# Patient Record
Sex: Female | Born: 1950 | Race: Black or African American | Hispanic: No | Marital: Married | Smoking: Never smoker
Health system: Southern US, Community
[De-identification: ages and names within clinical notes are randomized; demographics above are authoritative.]

## PROBLEM LIST (undated history)

## (undated) DIAGNOSIS — E119 Type 2 diabetes mellitus without complications: Secondary | ICD-10-CM

## (undated) DIAGNOSIS — K219 Gastro-esophageal reflux disease without esophagitis: Secondary | ICD-10-CM

## (undated) DIAGNOSIS — Z9889 Other specified postprocedural states: Secondary | ICD-10-CM

## (undated) DIAGNOSIS — E7800 Pure hypercholesterolemia, unspecified: Secondary | ICD-10-CM

## (undated) DIAGNOSIS — E039 Hypothyroidism, unspecified: Secondary | ICD-10-CM

## (undated) DIAGNOSIS — R112 Nausea with vomiting, unspecified: Secondary | ICD-10-CM

## (undated) DIAGNOSIS — G4730 Sleep apnea, unspecified: Secondary | ICD-10-CM

## (undated) DIAGNOSIS — I10 Essential (primary) hypertension: Secondary | ICD-10-CM

## (undated) HISTORY — PX: BREAST BIOPSY: SHX20

## (undated) HISTORY — PX: TUBAL LIGATION: SHX77

## (undated) HISTORY — PX: ABDOMINAL HYSTERECTOMY: SHX81

---

## 2001-03-04 ENCOUNTER — Ambulatory Visit (HOSPITAL_COMMUNITY): Admission: RE | Admit: 2001-03-04 | Discharge: 2001-03-04 | Payer: Self-pay | Admitting: Internal Medicine

## 2001-03-04 ENCOUNTER — Encounter: Payer: Self-pay | Admitting: Internal Medicine

## 2002-03-06 ENCOUNTER — Encounter: Payer: Self-pay | Admitting: Internal Medicine

## 2002-03-06 ENCOUNTER — Ambulatory Visit (HOSPITAL_COMMUNITY): Admission: RE | Admit: 2002-03-06 | Discharge: 2002-03-06 | Payer: Self-pay | Admitting: Internal Medicine

## 2002-12-08 ENCOUNTER — Ambulatory Visit (HOSPITAL_COMMUNITY): Admission: RE | Admit: 2002-12-08 | Discharge: 2002-12-08 | Payer: Self-pay | Admitting: Internal Medicine

## 2003-10-03 ENCOUNTER — Ambulatory Visit (HOSPITAL_COMMUNITY): Admission: RE | Admit: 2003-10-03 | Discharge: 2003-10-03 | Payer: Self-pay | Admitting: Internal Medicine

## 2004-10-23 ENCOUNTER — Ambulatory Visit (HOSPITAL_COMMUNITY): Admission: RE | Admit: 2004-10-23 | Discharge: 2004-10-23 | Payer: Self-pay | Admitting: Internal Medicine

## 2005-10-26 ENCOUNTER — Ambulatory Visit (HOSPITAL_COMMUNITY): Admission: RE | Admit: 2005-10-26 | Discharge: 2005-10-26 | Payer: Self-pay | Admitting: *Deleted

## 2006-11-01 ENCOUNTER — Ambulatory Visit (HOSPITAL_COMMUNITY): Admission: RE | Admit: 2006-11-01 | Discharge: 2006-11-01 | Payer: Self-pay | Admitting: Internal Medicine

## 2007-11-03 ENCOUNTER — Ambulatory Visit (HOSPITAL_COMMUNITY): Admission: RE | Admit: 2007-11-03 | Discharge: 2007-11-03 | Payer: Self-pay | Admitting: Internal Medicine

## 2007-11-08 ENCOUNTER — Ambulatory Visit (HOSPITAL_COMMUNITY): Admission: RE | Admit: 2007-11-08 | Discharge: 2007-11-08 | Payer: Self-pay | Admitting: Internal Medicine

## 2007-11-08 ENCOUNTER — Encounter: Payer: Self-pay | Admitting: Orthopedic Surgery

## 2007-12-01 ENCOUNTER — Ambulatory Visit: Payer: Self-pay | Admitting: Orthopedic Surgery

## 2007-12-01 DIAGNOSIS — M25579 Pain in unspecified ankle and joints of unspecified foot: Secondary | ICD-10-CM | POA: Insufficient documentation

## 2007-12-12 ENCOUNTER — Encounter: Payer: Self-pay | Admitting: Orthopedic Surgery

## 2008-11-05 ENCOUNTER — Ambulatory Visit (HOSPITAL_COMMUNITY): Admission: RE | Admit: 2008-11-05 | Discharge: 2008-11-05 | Payer: Self-pay | Admitting: Internal Medicine

## 2009-06-04 ENCOUNTER — Ambulatory Visit: Payer: Self-pay | Admitting: Orthopedic Surgery

## 2009-06-04 DIAGNOSIS — M7660 Achilles tendinitis, unspecified leg: Secondary | ICD-10-CM | POA: Insufficient documentation

## 2009-07-22 ENCOUNTER — Ambulatory Visit: Payer: Self-pay | Admitting: Orthopedic Surgery

## 2009-11-07 ENCOUNTER — Ambulatory Visit (HOSPITAL_COMMUNITY): Admission: RE | Admit: 2009-11-07 | Discharge: 2009-11-07 | Payer: Self-pay | Admitting: Internal Medicine

## 2010-10-10 ENCOUNTER — Other Ambulatory Visit (HOSPITAL_COMMUNITY): Payer: Self-pay | Admitting: Internal Medicine

## 2010-10-10 DIAGNOSIS — Z139 Encounter for screening, unspecified: Secondary | ICD-10-CM

## 2010-11-17 ENCOUNTER — Ambulatory Visit (HOSPITAL_COMMUNITY)
Admission: RE | Admit: 2010-11-17 | Discharge: 2010-11-17 | Disposition: A | Discharge status: Home / Self Care | Payer: BC Managed Care – PPO | Source: Ambulatory Visit | Attending: Internal Medicine | Admitting: Internal Medicine

## 2010-11-17 DIAGNOSIS — Z1231 Encounter for screening mammogram for malignant neoplasm of breast: Secondary | ICD-10-CM | POA: Insufficient documentation

## 2010-11-17 DIAGNOSIS — Z139 Encounter for screening, unspecified: Secondary | ICD-10-CM

## 2011-01-02 NOTE — Op Note (Signed)
   [name], [name]                   ACCOUNT NO.:  [id number]   MEDICAL RECORD NO.:  [id number]                   PATIENT TYPE:  AMB   LOCATION:  DAY                                  FACILITY:  [facility]   PHYSICIAN:  R. Roetta Sessions, M.D.              DATE OF BIRTH:  05-04-1951   DATE OF PROCEDURE:  12/08/2002  DATE OF DISCHARGE:                                 OPERATIVE REPORT   PROCEDURE:  Screening colonoscopy.   INDICATIONS FOR PROCEDURE:  The patient is a 60 year old lady referred for  colorectal cancer screening.  She is followed primarily by Dr. Ouida Sills.  She  is devoid of any lower GI tract symptoms.  No prior history of any imaging  of her colon.  There is no family history.  Colonoscopy is now being done.  This approach has been discussed with the patient at the bedside.  The  potential risks, benefits, and alternatives have been reviewed and questions  answered.  She is agreeable.   PROCEDURE:  O2 saturation, blood pressure, pulses, and respirations were  monitored throughout the entire procedure.  Conscious sedation was with  Versed 5 mg IV, Demerol 50 mg IV in divided doses.  The instrument used was  the Olympus video chip adult colonoscope.   FINDINGS:  Digital rectal examination revealed no abnormalities.   ENDOSCOPIC FINDINGS:  The prep was good.   Rectum:  Examination of the rectal mucosa including retroflex view of the  anal verge revealed no abnormalities.   Colon:  The colonic mucosa was surveyed from the rectosigmoid junction  through the left, transverse, right colon to the area of the appendiceal  orifice, ileocecal valve, and cecum.  These structures were well-seen and  photographed for the record.  Aside from having a redundant and elongated  colon, the mucosa appeared normal to the cecum.  From the level of the cecum  and ileocecal valve, the scope was slowly withdrawn.  All previously  mentioned mucosal surfaces were again seen.  No other  abnormalities were  observed.  The patient tolerated the procedure well and was reactive in  endoscopy.    IMPRESSION:  Normal rectum.  Tortuous, elongated but otherwise normal colon.   RECOMMENDATIONS:  Repeat colonoscopy in 10 years.                                               Jonathon Bellows, M.D.    RMR/MEDQ  D:  12/08/2002  T:  12/08/2002  Job:  [id number]   cc:   Kingsley Callander. Ouida Sills, M.D.  [address]  [place]  [place] [address]  Fax: [phone]

## 2011-01-15 ENCOUNTER — Emergency Department (HOSPITAL_COMMUNITY)
Admission: EM | Admit: 2011-01-15 | Discharge: 2011-01-15 | Disposition: A | Discharge status: Home / Self Care | Payer: BC Managed Care – PPO | Attending: Emergency Medicine | Admitting: Emergency Medicine

## 2011-01-15 DIAGNOSIS — K219 Gastro-esophageal reflux disease without esophagitis: Secondary | ICD-10-CM | POA: Insufficient documentation

## 2011-01-15 DIAGNOSIS — S90569A Insect bite (nonvenomous), unspecified ankle, initial encounter: Secondary | ICD-10-CM | POA: Insufficient documentation

## 2011-01-15 DIAGNOSIS — E119 Type 2 diabetes mellitus without complications: Secondary | ICD-10-CM | POA: Insufficient documentation

## 2011-01-15 DIAGNOSIS — E7800 Pure hypercholesterolemia, unspecified: Secondary | ICD-10-CM | POA: Insufficient documentation

## 2011-11-13 ENCOUNTER — Other Ambulatory Visit (HOSPITAL_COMMUNITY): Payer: Self-pay | Admitting: Internal Medicine

## 2011-11-13 DIAGNOSIS — Z139 Encounter for screening, unspecified: Secondary | ICD-10-CM

## 2011-12-07 ENCOUNTER — Ambulatory Visit (HOSPITAL_COMMUNITY)
Admission: RE | Admit: 2011-12-07 | Discharge: 2011-12-07 | Disposition: A | Discharge status: Home / Self Care | Payer: BC Managed Care – PPO | Source: Ambulatory Visit | Attending: Internal Medicine | Admitting: Internal Medicine

## 2011-12-07 DIAGNOSIS — Z139 Encounter for screening, unspecified: Secondary | ICD-10-CM

## 2011-12-07 DIAGNOSIS — Z1231 Encounter for screening mammogram for malignant neoplasm of breast: Secondary | ICD-10-CM | POA: Insufficient documentation

## 2012-11-21 ENCOUNTER — Other Ambulatory Visit (HOSPITAL_COMMUNITY): Payer: Self-pay | Admitting: Internal Medicine

## 2012-11-21 DIAGNOSIS — Z139 Encounter for screening, unspecified: Secondary | ICD-10-CM

## 2012-11-30 ENCOUNTER — Telehealth: Payer: Self-pay

## 2012-11-30 ENCOUNTER — Other Ambulatory Visit: Payer: Self-pay

## 2012-11-30 DIAGNOSIS — Z1211 Encounter for screening for malignant neoplasm of colon: Secondary | ICD-10-CM

## 2012-11-30 NOTE — Telephone Encounter (Signed)
Gastroenterology Pre-Procedure Form     Request Date: 12/26/2012     Requesting Physician: Dr. Jena Gauss   ( Last one was done 12/08/2002 by [facility] and next was due in 10 years)     PATIENT INFORMATION:  Stephanie [name] is a 62 y.o., female (DOB=1951-01-12).  PROCEDURE: Procedure(s) requested: colonoscopy Procedure Reason: screening for colon cancer  PATIENT REVIEW QUESTIONS: The patient reports the following:   1. Diabetes Melitis: YES 2. Joint replacements in the past 12 months: no 3. Major health problems in the past 3 months: no 4. Has an artificial valve or MVP:no 5. Has been advised in past to take antibiotics in advance of a procedure like teeth cleaning: no}    MEDICATIONS & ALLERGIES:    Patient reports the following regarding taking any blood thinners:   Plavix? no Aspirin? YES Coumadin?  no  Patient confirms/reports the following medications:  Current Outpatient Prescriptions  Medication Sig Dispense Refill  . aspirin 81 MG tablet Take 81 mg by mouth daily.      . metFORMIN (GLUCOPHAGE) 500 MG tablet Take 500 mg by mouth 2 (two) times daily with a meal.      . pravastatin (PRAVACHOL) 40 MG tablet Take 40 mg by mouth daily.      . ranitidine (ZANTAC) 150 MG tablet Take 150 mg by mouth 2 (two) times daily. Only occasionally       No current facility-administered medications for this visit.    Patient confirms/reports the following allergies:  No Known Allergies  Patient is appropriate to schedule for requested procedure(s): yes  AUTHORIZATION INFORMATION Primary Insurance:   ID #:   Group #: Pre-Cert / Auth required:  Pre-Cert / Auth #:   Secondary Insurance:   ID #:  Group #:  Pre-Cert / Auth required:  Pre-Cert / Auth #:   No orders of the defined types were placed in this encounter.    SCHEDULE INFORMATION: Procedure has been scheduled as follows:  Date:   12/26/2012     Time: 9:15 AM  Location: [facility] Short Stay  This Gastroenterology  Pre-Precedure Form is being routed to the following provider(s) for review: R. Roetta Sessions, MD

## 2012-12-01 MED ORDER — PEG-KCL-NACL-NASULF-NA ASC-C 100 G PO SOLR: 1.0000 | ORAL | Status: DC

## 2012-12-01 NOTE — Telephone Encounter (Signed)
Rx sent to [facility] in [place] and instructions mailed to pt.

## 2012-12-01 NOTE — Telephone Encounter (Signed)
No Glucophage the night before procedure or the morning of procedure.  Otherwise, ok to proceed.

## 2012-12-12 ENCOUNTER — Ambulatory Visit (HOSPITAL_COMMUNITY)
Admission: RE | Admit: 2012-12-12 | Discharge: 2012-12-12 | Disposition: A | Discharge status: Home / Self Care | Payer: BC Managed Care – PPO | Source: Ambulatory Visit | Attending: Internal Medicine | Admitting: Internal Medicine

## 2012-12-12 DIAGNOSIS — Z139 Encounter for screening, unspecified: Secondary | ICD-10-CM

## 2012-12-12 DIAGNOSIS — Z1231 Encounter for screening mammogram for malignant neoplasm of breast: Secondary | ICD-10-CM | POA: Insufficient documentation

## 2012-12-20 ENCOUNTER — Encounter (HOSPITAL_COMMUNITY): Payer: Self-pay | Admitting: Pharmacy Technician

## 2012-12-26 ENCOUNTER — Ambulatory Visit (HOSPITAL_COMMUNITY)
Admission: RE | Admit: 2012-12-26 | Discharge: 2012-12-26 | Disposition: A | Discharge status: Home / Self Care | Payer: BC Managed Care – PPO | Source: Ambulatory Visit | Attending: Internal Medicine | Admitting: Internal Medicine

## 2012-12-26 ENCOUNTER — Encounter (HOSPITAL_COMMUNITY): Payer: Self-pay | Admitting: *Deleted

## 2012-12-26 ENCOUNTER — Encounter (HOSPITAL_COMMUNITY)
Admission: RE | Disposition: A | Discharge status: Home / Self Care | Payer: Self-pay | Source: Ambulatory Visit | Attending: Internal Medicine

## 2012-12-26 DIAGNOSIS — Z7982 Long term (current) use of aspirin: Secondary | ICD-10-CM | POA: Insufficient documentation

## 2012-12-26 DIAGNOSIS — Z1211 Encounter for screening for malignant neoplasm of colon: Secondary | ICD-10-CM

## 2012-12-26 DIAGNOSIS — Q438 Other specified congenital malformations of intestine: Secondary | ICD-10-CM | POA: Insufficient documentation

## 2012-12-26 DIAGNOSIS — E7800 Pure hypercholesterolemia, unspecified: Secondary | ICD-10-CM | POA: Insufficient documentation

## 2012-12-26 DIAGNOSIS — K219 Gastro-esophageal reflux disease without esophagitis: Secondary | ICD-10-CM | POA: Insufficient documentation

## 2012-12-26 DIAGNOSIS — E119 Type 2 diabetes mellitus without complications: Secondary | ICD-10-CM | POA: Insufficient documentation

## 2012-12-26 DIAGNOSIS — Z79899 Other long term (current) drug therapy: Secondary | ICD-10-CM | POA: Insufficient documentation

## 2012-12-26 HISTORY — DX: Gastro-esophageal reflux disease without esophagitis: K21.9

## 2012-12-26 HISTORY — DX: Type 2 diabetes mellitus without complications: E11.9

## 2012-12-26 HISTORY — DX: Pure hypercholesterolemia, unspecified: E78.00

## 2012-12-26 HISTORY — PX: COLONOSCOPY: SHX5424

## 2012-12-26 LAB — GLUCOSE, CAPILLARY

## 2012-12-26 SURGERY — COLONOSCOPY
Anesthesia: Moderate Sedation

## 2012-12-26 MED ORDER — MEPERIDINE HCL 100 MG/ML IJ SOLN
INTRAMUSCULAR | Status: AC
  Filled 2012-12-26: qty 1

## 2012-12-26 MED ORDER — ONDANSETRON HCL 4 MG/2ML IJ SOLN
INTRAMUSCULAR | Status: DC | PRN
  Administered 2012-12-26: 4 mg via INTRAVENOUS

## 2012-12-26 MED ORDER — SODIUM CHLORIDE 0.9 % IV SOLN
INTRAVENOUS | Status: DC
  Administered 2012-12-26: 09:00:00 via INTRAVENOUS

## 2012-12-26 MED ORDER — MIDAZOLAM HCL 5 MG/5ML IJ SOLN
INTRAMUSCULAR | Status: DC | PRN
  Administered 2012-12-26: 1 mg via INTRAVENOUS
  Administered 2012-12-26: 2 mg via INTRAVENOUS
  Administered 2012-12-26 (×2): 1 mg via INTRAVENOUS

## 2012-12-26 MED ORDER — MEPERIDINE HCL 100 MG/ML IJ SOLN
INTRAMUSCULAR | Status: DC | PRN
  Administered 2012-12-26: 25 mg via INTRAVENOUS
  Administered 2012-12-26: 50 mg via INTRAVENOUS

## 2012-12-26 MED ORDER — ONDANSETRON HCL 4 MG/2ML IJ SOLN
INTRAMUSCULAR | Status: AC
  Filled 2012-12-26: qty 2

## 2012-12-26 MED ORDER — MIDAZOLAM HCL 5 MG/5ML IJ SOLN
INTRAMUSCULAR | Status: AC
  Filled 2012-12-26: qty 10

## 2012-12-26 MED ORDER — SIMETHICONE 40 MG/0.6ML PO SUSP
ORAL | Status: DC | PRN
  Administered 2012-12-26: 09:00:00

## 2012-12-26 NOTE — H&P (Signed)
  Primary Care Physician:  Carylon Perches, MD Primary Gastroenterologist:  Dr. Jena Gauss  Pre-Procedure History & Physical: HPI:  Stephanie [name] is a 62 y.o. female is here for a screening colonoscopy.  Negative examination 10 years ago. No bowel symptoms. No family history colon polyps or colon cancer  Past Medical History  Diagnosis Date  . GERD (gastroesophageal reflux disease)   . Hypercholesteremia   . Diabetes mellitus without complication     Past Surgical History  Procedure Laterality Date  . Abdominal hysterectomy    . Tubal ligation      Prior to Admission medications   Medication Sig Start Date End Date Taking? Authorizing Provider  aspirin 81 MG tablet Take 81 mg by mouth daily.   Yes Historical Provider, MD  metFORMIN (GLUCOPHAGE) 500 MG tablet Take 500 mg by mouth 2 (two) times daily with a meal.   Yes Historical Provider, MD  peg 3350 powder (MOVIPREP) 100 G SOLR Take 1 kit (100 g total) by mouth as directed. 12/01/12  Yes Corbin Ade, MD  pravastatin (PRAVACHOL) 40 MG tablet Take 40 mg by mouth daily.   Yes Historical Provider, MD  ranitidine (ZANTAC) 150 MG tablet Take 150 mg by mouth daily as needed for heartburn. Only occasionally   Yes Historical Provider, MD    Allergies as of 11/30/2012  . (No Known Allergies)    Family History  Problem Relation Age of Onset  . Colon cancer Neg Hx     History   Social History  . Marital Status: Married    Spouse Name: N/A    Number of Children: N/A  . Years of Education: N/A   Occupational History  . Not on file.   Social History Main Topics  . Smoking status: Never Smoker   . Smokeless tobacco: Not on file  . Alcohol Use: No  . Drug Use: No  . Sexually Active: Not on file   Other Topics Concern  . Not on file   Social History Narrative  . No narrative on file    Review of Systems: See HPI, otherwise negative ROS  Physical Exam: BP 144/80  Pulse 78  Temp(Src) 97.9 F (36.6 C) (Oral)  Resp 19   Ht 5\' 3"  (1.6 m)  Wt 189 lb (85.73 kg)  BMI 33.49 kg/m2  SpO2 96% General:   Alert,  Well-developed, well-nourished, pleasant and cooperative in NAD Head:  Normocephalic and atraumatic. Eyes:  Sclera clear, no icterus.   Conjunctiva pink. Ears:  Normal auditory acuity. Nose:  No deformity, discharge,  or lesions. Mouth:  No deformity or lesions, dentition normal. Neck:  Supple; no masses or thyromegaly. Lungs:  Clear throughout to auscultation.   No wheezes, crackles, or rhonchi. No acute distress. Heart:  Regular rate and rhythm; no murmurs, clicks, rubs,  or gallops. Abdomen:   obese. Positive bowel sales. Soft and nontender without mass or organomegaly.  Msk:  Symmetrical without gross deformities. Normal posture. Pulses:  Normal pulses noted. Extremities:  Without clubbing or edema. Neurologic:  Alert and  oriented x4;  grossly normal neurologically. Skin:  Intact without significant lesions or rashes. Cervical Nodes:  No significant cervical adenopathy. Psych:  Alert and cooperative. Normal mood and affect.  Impression/Plan: Stephanie [name] is now here to undergo a screening colonoscopy.  Average risk screening examination.  Risks, benefits, limitations, imponderables and alternatives regarding colonoscopy have been reviewed with the patient. Questions have been answered. All parties agreeable.

## 2012-12-26 NOTE — Op Note (Signed)
[facility] [address] [facility] [place], [address]   COLONOSCOPY PROCEDURE REPORT  PATIENT: Stephanie [name], Stephanie [name]  MR#:         [id number] BIRTHDATE: 11/19/1950 , 61  yrs. old GENDER: Female ENDOSCOPIST: R.  Roetta Sessions, MD FACP FACG REFERRED BY:  Carylon Perches, M.D. PROCEDURE DATE:  12/26/2012 PROCEDURE:     Screening colonoscopy  INDICATIONS: average risk colorectal cancer screening examination  INFORMED CONSENT:  The risks, benefits, alternatives and imponderables including but not limited to bleeding, perforation as well as the possibility of a missed lesion have been reviewed.  The potential for biopsy, lesion removal, etc. have also been discussed.  Questions have been answered.  All parties agreeable. Please see the history and physical in the medical record for more information.  MEDICATIONS: Versed 5 mg IV and Demerol 75 mg IV in divided doses. Zofran 4 mg IV  DESCRIPTION OF PROCEDURE:  After a digital rectal exam was performed, the EC-3890Li ([id number])  colonoscope was advanced from the anus through the rectum and colon to the area of the cecum, ileocecal valve and appendiceal orifice.  The cecum was deeply intubated.  These structures were well-seen and photographed for the record.  From the level of the cecum and ileocecal valve, the scope was slowly and cautiously withdrawn.  The mucosal surfaces were carefully surveyed utilizing scope tip deflection to facilitate fold flattening as needed.  The scope was pulled down into the rectum where a thorough examination including retroflexion was performed.    FINDINGS:  Adequate preparation. Normal-appearing colonic and rectal mucosa. The colon was somewhat tortuous.  THERAPEUTIC / DIAGNOSTIC MANEUVERS PERFORMED:  COMPLICATIONS: none  CECAL WITHDRAWAL TIME:  8 minutes  IMPRESSION:  Normal rectum and colon  RECOMMENDATIONS: Repeat screening examination in 10  years   _______________________________ eSigned:  R. Roetta Sessions, MD FACP [facility] 12/26/2012 10:15 AM   CC:

## 2012-12-28 ENCOUNTER — Encounter (HOSPITAL_COMMUNITY): Payer: Self-pay | Admitting: Internal Medicine

## 2013-01-26 ENCOUNTER — Ambulatory Visit (INDEPENDENT_AMBULATORY_CARE_PROVIDER_SITE_OTHER): Payer: BC Managed Care – PPO

## 2013-01-26 ENCOUNTER — Ambulatory Visit (INDEPENDENT_AMBULATORY_CARE_PROVIDER_SITE_OTHER): Payer: BC Managed Care – PPO | Admitting: Orthopedic Surgery

## 2013-01-26 VITALS — BP 146/92 | Ht 62.0 in | Wt 191.0 lb

## 2013-01-26 DIAGNOSIS — M1712 Unilateral primary osteoarthritis, left knee: Secondary | ICD-10-CM

## 2013-01-26 DIAGNOSIS — M25562 Pain in left knee: Secondary | ICD-10-CM

## 2013-01-26 DIAGNOSIS — M1710 Unilateral primary osteoarthritis, unspecified knee: Secondary | ICD-10-CM

## 2013-01-26 DIAGNOSIS — M25569 Pain in unspecified knee: Secondary | ICD-10-CM

## 2013-01-26 DIAGNOSIS — IMO0002 Reserved for concepts with insufficient information to code with codable children: Secondary | ICD-10-CM

## 2013-01-26 MED ORDER — ETODOLAC ER 400 MG PO TB24: 400.0000 mg | ORAL_TABLET | Freq: Two times a day (BID) | ORAL | Status: DC

## 2013-01-26 NOTE — Patient Instructions (Addendum)
You have received a steroid shot. 15% of patients experience increased pain at the injection site with in the next 24 hours. This is best treated with ice and tylenol extra strength 2 tabs every 8 hours. If you are still having pain please call the office.    

## 2013-01-30 ENCOUNTER — Encounter: Payer: Self-pay | Admitting: Orthopedic Surgery

## 2013-01-30 NOTE — Progress Notes (Signed)
Patient ID: Stephanie [name], female   DOB: Mar 17, 1951, 62 y.o.   MRN: [id number] Chief Complaint  Patient presents with  . Knee Pain    Left knee pain and stiffness. Referred by Dr. Ouida Sills  consult request Dr. Ouida Sills  History: A 62 year old female presents with gradual onset of left knee pain radiating down to her left ankle. She was treated with etodolac twice a day did not improve. 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 She gets partial relief with medication or heating pad is worse when she stands more than he is in constant flexion. She does report some tingling in the leg as well as catching in the knee and swelling. Review of systems history of weight gain and fatigue blurred vision and watering of the eyes palpitations and shortness of breath with occasional snoring. She complains of heartburn and constipation joint pain and swelling stiffness rash and itching, numbness and tingling with dizziness, nervousness. Otherwise all other systems were reviewed and were negative.  History of diabetes acid reflux high cholesterol and arthritis  Hysterectomy, tubal ligation. Current medications are metformin, pravastatin, Zantac, aspirin.  Family history heart disease arthritis cancer and diabetes as well as ALS.  Subcutaneous history she's married she works in the [employer]. She does not smoke or drink and she completed her education through the 12th grade.  General appearance is normal, the patient is alert and oriented x3 with normal mood and affect. 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 Inspection and palpation left knee, revealed crepitance and tenderness on the medial joint line and in the patellofemoral joint range of motion is not restricted stability tests are normal muscle tone excellent skin intact pulse normal temperature normal sensation normal  No  abnormalities palpated or detected in terms of abnormal stability weakness skin rash in the Right knee X-rays show probable arthritic change without severe bone to bone disease.  Recommend injection for osteoarthritis  Patient agrees  Knee  Injection Procedure Note  Pre-operative Diagnosis: left knee oa  Post-operative Diagnosis: same  Indications: pain  Anesthesia: ethyl chloride   Procedure Details   Verbal consent was obtained for the procedure. Time out was completed.The joint was prepped with alcohol, followed by  Ethyl chloride spray and A 20 gauge needle was inserted into the knee via lateral approach; 4ml 1% lidocaine and 1 ml of depomedrol  was then injected into the joint . The needle was removed and the area cleansed and dressed.  Complications:  None; patient tolerated the procedure well.

## 2013-02-23 ENCOUNTER — Ambulatory Visit (INDEPENDENT_AMBULATORY_CARE_PROVIDER_SITE_OTHER): Payer: BC Managed Care – PPO | Admitting: Orthopedic Surgery

## 2013-02-23 VITALS — BP 123/70 | Ht 62.0 in | Wt 191.0 lb

## 2013-02-23 DIAGNOSIS — M179 Osteoarthritis of knee, unspecified: Secondary | ICD-10-CM

## 2013-02-23 DIAGNOSIS — M1710 Unilateral primary osteoarthritis, unspecified knee: Secondary | ICD-10-CM

## 2013-02-23 DIAGNOSIS — IMO0002 Reserved for concepts with insufficient information to code with codable children: Secondary | ICD-10-CM

## 2013-02-23 NOTE — Progress Notes (Signed)
Patient ID: Stephanie [name], female   DOB: October 26, 1950, 62 y.o.   MRN: [id number] Recheck left knee  Osteoarthritis left knee. Injected. Brace.   Full range of motion no tenderness. Improved. Followup as needed continue brace

## 2013-11-06 ENCOUNTER — Other Ambulatory Visit (HOSPITAL_COMMUNITY): Payer: Self-pay | Admitting: Internal Medicine

## 2013-11-06 DIAGNOSIS — Z1231 Encounter for screening mammogram for malignant neoplasm of breast: Secondary | ICD-10-CM

## 2013-12-18 ENCOUNTER — Ambulatory Visit (HOSPITAL_COMMUNITY)
Admission: RE | Admit: 2013-12-18 | Discharge: 2013-12-18 | Disposition: A | Discharge status: Home / Self Care | Payer: BC Managed Care – PPO | Source: Ambulatory Visit | Attending: Internal Medicine | Admitting: Internal Medicine

## 2013-12-18 DIAGNOSIS — Z1231 Encounter for screening mammogram for malignant neoplasm of breast: Secondary | ICD-10-CM

## 2014-01-23 ENCOUNTER — Ambulatory Visit (INDEPENDENT_AMBULATORY_CARE_PROVIDER_SITE_OTHER): Payer: BC Managed Care – PPO

## 2014-01-23 ENCOUNTER — Encounter: Payer: Self-pay | Admitting: Orthopedic Surgery

## 2014-01-23 ENCOUNTER — Ambulatory Visit (INDEPENDENT_AMBULATORY_CARE_PROVIDER_SITE_OTHER): Payer: BC Managed Care – PPO | Admitting: Orthopedic Surgery

## 2014-01-23 VITALS — BP 155/89 | Ht 62.0 in | Wt 190.0 lb

## 2014-01-23 DIAGNOSIS — M25569 Pain in unspecified knee: Secondary | ICD-10-CM

## 2014-01-23 DIAGNOSIS — M1710 Unilateral primary osteoarthritis, unspecified knee: Secondary | ICD-10-CM

## 2014-01-23 DIAGNOSIS — M179 Osteoarthritis of knee, unspecified: Secondary | ICD-10-CM

## 2014-01-23 DIAGNOSIS — IMO0002 Reserved for concepts with insufficient information to code with codable children: Secondary | ICD-10-CM

## 2014-01-23 MED ORDER — TRAMADOL HCL 50 MG PO TABS: 50.0000 mg | ORAL_TABLET | Freq: Four times a day (QID) | ORAL | Status: DC | PRN

## 2014-01-23 MED ORDER — DICLOFENAC POTASSIUM 50 MG PO TABS: 50.0000 mg | ORAL_TABLET | Freq: Two times a day (BID) | ORAL | Status: DC

## 2014-01-23 NOTE — Progress Notes (Signed)
Patient ID: Stephanie [name], female   DOB: Sep 19, 1950, 63 y.o.   MRN: [id number]  Chief Complaint  Patient presents with  . Knee Pain    History this is a 63 year old female presents to [place] for the first time with a history of bilateral knee pain left greater than right for the last 3-4 months unrelieved by Tylenol slightly improved icy hot. She says she has no functional deficits but when questioned further she does have difficulty climbing the stairs and she has weakness in the left knee when getting out of a chair. She also reports stiffness aching pain catching and 5/10 intensity  System review is negative except for some denture issues on heartburn joint pain visual disturbance memory loss mild numbness and tingling related to her diabetes mild diabetic symptoms of polydipsia and polyphagia  Otherwise normal  Medical history diabetes and reflux  Hypertension  Hysterectomy tubal ligation under surgical treatments  Current medications Zantac aspirin losartan metformin and progress that  No medical allergies  Family history of ALS and diabetes cancer arthritis thyroid disease heart attacks trochanter hypertension  No social habits  Vital signs: BP 155/89  Ht 5\' 2"  (1.575 m)  Wt 190 lb (86.183 kg)  BMI 34.74 kg/m2  General the patient is well-developed and well-nourished grooming and hygiene are normal  Upper extremity exam  The right and left upper extremity:   Inspection and palpation revealed no abnormalities in the upper extremities.   Range of motion is full without contracture.  Motor exam is normal with grade 5 strength.  The joints are fully reduced without subluxation.  There is no atrophy or tremor and muscle tone is normal.  All joints are stable.   Oriented x3 Mood and affect normal Ambulation remains normal Inspection of the lower extremities She only has about 90 of flexion in both knees with trace joint effusions primarily medial joint line  tenderness. Fortunately her ligaments are stable she has good muscle tone and strength in the quadriceps muscles her skin is clean dry and intact in each leg and both legs exhibit normal pulses and sensation   Bilateral knee x-rays ordered and interpreted show severe arthritis of the right less severe on the left see reports  Encounter Diagnoses  Name Primary?  . Knee pain   . Arthritis of knee, degenerative Yes    Meds ordered this encounter  Medications  . diclofenac (CATAFLAM) 50 MG tablet    Sig: Take 1 tablet (50 mg total) by mouth 2 (two) times daily.    Dispense:  90 tablet    Refill:  3  . traMADol (ULTRAM) 50 MG tablet    Sig: Take 1 tablet (50 mg total) by mouth every 6 (six) hours as needed.    Dispense:  60 tablet    Refill:  5   Home exercise knee program as well. 3 month followup

## 2014-01-23 NOTE — Patient Instructions (Signed)
Knee Exercises EXERCISES RANGE OF MOTION(ROM) AND STRETCHING EXERCISES These exercises may help you when beginning to rehabilitate your injury. Your symptoms may resolve with or without further involvement from your physician, physical therapist or athletic trainer. While completing these exercises, remember:    Restoring tissue flexibility helps normal motion to return to the joints. This allows healthier, less painful movement and activity.   An effective stretch should be held for at least 30 seconds.   A stretch should never be painful. You should only feel a gentle lengthening or release in the stretched tissue.  STRETCH - Knee Extension, Prone  Lie on your stomach on a firm surface, such as a bed or countertop. Place your right / left knee and leg just beyond the edge of the surface. You may wish to place a towel under the far end of your right / left thigh for comfort.   Relax your leg muscles and allow gravity to straighten your knee. Your clinician may advise you to add an ankle weight if more resistance is helpful for you.  You should feel a stretch in the back of your right / left knee.  RANGE OF MOTION - Knee Flexion, Active  Lie on your back with both knees straight. (If this causes back discomfort, bend your opposite knee, placing your foot flat on the floor.)   Slowly slide your heel back toward your buttocks until you feel a gentle stretch in the front of your knee or thigh.     STRETCH - Quadriceps, Prone   Lie on your stomach on a firm surface, such as a bed or padded floor.   Bend your right / left knee and grasp your ankle. If you are unable to reach, your ankle or pant leg, use a belt around your foot to lengthen your reach.   Gently pull your heel toward your buttocks. Your knee should not slide out to the side. You should feel a stretch in the front of your thigh and/or knee.   STRETCH  Hamstrings, Supine   Lie on your back. Loop a belt or towel over the ball of  your right / left foot.   Straighten your right / left knee and slowly pull on the belt to raise your leg. Do not allow the right / left knee to bend. Keep your opposite leg flat on the floor.  Raise the leg until you feel a gentle stretch behind your right / left knee or thigh.  STRENGTHENING EXERCISES These exercises may help you when beginning to rehabilitate your injury. They may resolve your symptoms with or without further involvement from your physician, physical therapist or athletic trainer. While completing these exercises, remember:    Muscles can gain both the endurance and the strength needed for everyday activities through controlled exercises.   Complete these exercises as instructed by your physician, physical therapist or athletic trainer. Progress the resistance and repetitions only as guided.   You may experience muscle soreness or fatigue, but the pain or discomfort you are trying to eliminate should never worsen during these exercises. If this pain does worsen, stop and make certain you are following the directions exactly. If the pain is still present after adjustments, discontinue the exercise until you can discuss the trouble with your clinician.  STRENGTH - Quadriceps, Isometrics  Lie on your back with your right / left leg extended and your opposite knee bent.   Gradually tense the muscles in the front of your right / left   thigh. You should see either your knee cap slide up toward your hip or increased dimpling just above the knee. This motion will push the back of the knee down toward the floor/mat/bed on which you are lying.   STRENGTH - Quadriceps, Short Arcs   Lie on your back. Place a rolled  towel roll under your knee so that the knee slightly bends.   Raise only your lower leg by tightening the muscles in the front of your thigh. Do not allow your thigh to rise.     STRENGTH - Quadriceps, Straight Leg Raises  Quality counts! Watch for signs that the  quadriceps muscle is working to insure you are strengthening the correct muscles and not "cheating" by substituting with healthier muscles.  Lay on your back with your right / left leg extended and your opposite knee bent.   Tense the muscles in the front of your right / left thigh. You should see either your knee cap slide up or increased dimpling just above the knee. Your thigh may even quiver.  Tighten these muscles even more and raise your leg 4 to 6 inches off the floor.   STRENGTH - Hamstring, Curls  Lay on your stomach with your legs extended. (If you lay on a bed, your feet may hang over the edge.)   Tighten the muscles in the back of your thigh to bend your right / left knee up to 90 degrees. Keep your hips flat on the bed/floor.    STRENGTH  Quadriceps, Squats  Stand in a door frame so that your feet and knees are in line with the frame.   Use your hands for balance, not support, on the frame.   Slowly lower your weight, bending at the hips and knees. Keep your lower legs upright so that they are parallel with the door frame. Squat only within the range that does not increase your knee pain. Never let your hips drop below your knees.   Slowly return upright, pushing with your legs, not pulling with your hands.   STRENGTH - Quadriceps, Wall Slides  Follow guidelines for form closely. Increased knee pain often results from poorly placed feet or knees.  Lean against a smooth wall or door and walk your feet out 18-24 inches. Place your feet hip-width apart.   Slowly slide down the wall or door until your knees bend _30________ degrees.* Keep your knees over your heels, not your toes, and in line with your hips, not falling to either side.   * Your physician, physical therapist or athletic trainer will alter this angle based on your symptoms and progress. Document Released: 06/17/2005 Document Revised: 10/26/2011 Document Reviewed: 11/15/2008 [employer] Patient Information 2013  [employer]    

## 2014-04-26 ENCOUNTER — Ambulatory Visit (INDEPENDENT_AMBULATORY_CARE_PROVIDER_SITE_OTHER): Payer: BC Managed Care – PPO | Admitting: Orthopedic Surgery

## 2014-04-26 VITALS — Ht 62.0 in | Wt 190.0 lb

## 2014-04-26 DIAGNOSIS — M1710 Unilateral primary osteoarthritis, unspecified knee: Secondary | ICD-10-CM

## 2014-04-26 DIAGNOSIS — M170 Bilateral primary osteoarthritis of knee: Secondary | ICD-10-CM

## 2014-04-26 NOTE — Patient Instructions (Signed)
Continue medication.

## 2014-04-27 NOTE — Progress Notes (Signed)
Chief Complaint  Patient presents with  . Follow-up    3 month recheck bilateral knees, s/p meds and home exercises    The patient reports that the diclofenac 50 mg twice a day is doing very well for her knee pain and has improved her shortness. She continues to have no catching locking or giving way review of systems musculoskeletal related to the knees is otherwise normal  Exam shows a well-developed well-nourished female grooming and hygiene are intact vital signs are Ht  (1.575 m)  Wt 190 lb (86.183 kg)  BMI 34.74 kg/m2  Bilateral knee examination  There is no tenderness or swelling in either knee. Knee flexion on the right 115 on the left is 120. Both knees are stable. Neurovascular examination is intact  Impression stable bilateral knees continue current arthritis medication.  Followup 3 months

## 2014-07-26 ENCOUNTER — Ambulatory Visit (INDEPENDENT_AMBULATORY_CARE_PROVIDER_SITE_OTHER): Payer: BC Managed Care – PPO | Admitting: Orthopedic Surgery

## 2014-07-26 ENCOUNTER — Encounter: Payer: Self-pay | Admitting: Orthopedic Surgery

## 2014-07-26 VITALS — HR 20 | Ht 62.0 in | Wt 190.0 lb

## 2014-07-26 DIAGNOSIS — M25561 Pain in right knee: Secondary | ICD-10-CM

## 2014-07-26 DIAGNOSIS — M170 Bilateral primary osteoarthritis of knee: Secondary | ICD-10-CM

## 2014-07-26 DIAGNOSIS — M25562 Pain in left knee: Secondary | ICD-10-CM

## 2014-07-26 MED ORDER — DICLOFENAC POTASSIUM 50 MG PO TABS: 50.0000 mg | ORAL_TABLET | Freq: Two times a day (BID) | ORAL | Status: DC

## 2014-07-26 NOTE — Progress Notes (Signed)
Patient ID: Stephanie [name], female   DOB: [birth year], 63 y.o.   MRN: [id number]  Chief Complaint  Patient presents with  . Follow-up    3 month recheck bilateral knees s/p home pt    HPI Stephanie [name] is a 63 y.o. female.  The patient notes her knees have then spray stable although she has noticed increased pain when she ran out of her diclofenac. It was holding her very well.  Review of Systems Review of Systems 1. Denies catching locking giving way of the knees has some difficulties getting up out of a chair  No Known Allergies  Current Outpatient Prescriptions  Medication Sig Dispense Refill  . aspirin 81 MG tablet Take 81 mg by mouth daily.    . diclofenac (CATAFLAM) 50 MG tablet Take 1 tablet (50 mg total) by mouth 2 (two) times daily. 90 tablet 3  . losartan (COZAAR) 100 MG tablet Take 100 mg by mouth daily.    . metFORMIN (GLUCOPHAGE) 500 MG tablet Take 500 mg by mouth 2 (two) times daily with a meal.    . pravastatin (PRAVACHOL) 40 MG tablet Take 40 mg by mouth daily.    . ranitidine (ZANTAC) 150 MG tablet Take 150 mg by mouth daily as needed for heartburn. Only occasionally    . traMADol (ULTRAM) 50 MG tablet Take 1 tablet (50 mg total) by mouth every 6 (six) hours as needed. 60 tablet 5  . etodolac (LODINE XL) 400 MG 24 hr tablet Take 1 tablet (400 mg total) by mouth 2 (two) times daily. (Patient not taking: Reported on 07/26/2014) 56 tablet 3  . peg 3350 powder (MOVIPREP) 100 G SOLR Take 1 kit (100 g total) by mouth as directed. (Patient not taking: Reported on 07/26/2014) 1 kit 0   No current facility-administered medications for this visit.      Physical Exam Physical Exam Pulse 20, height '5\' 2"'  (1.575 m), weight 190 lb (86.183 kg).  Gen. appearance normal The patient is alert and oriented person place and time  Exam of the knees  Inspection no warmth or swelling in either knee ROM painless passive range of motion without tenderness in either  knee Symmetric quadricep strength without atrophy in each knee and leg   Data Reviewed   Assessment    Stable osteoarthritis of both knees    Plan    Resume diclofenac return 3 months  Meds ordered this encounter  Medications  . losartan (COZAAR) 100 MG tablet    Sig: Take 100 mg by mouth daily.  . diclofenac (CATAFLAM) 50 MG tablet    Sig: Take 1 tablet (50 mg total) by mouth 2 (two) times daily.    Dispense:  90 tablet    Refill:  3          Arther Abbott 07/26/2014, 4:13 PM

## 2014-10-25 ENCOUNTER — Ambulatory Visit (INDEPENDENT_AMBULATORY_CARE_PROVIDER_SITE_OTHER): Payer: BLUE CROSS/BLUE SHIELD | Admitting: Orthopedic Surgery

## 2014-10-25 ENCOUNTER — Encounter: Payer: Self-pay | Admitting: Orthopedic Surgery

## 2014-10-25 VITALS — BP 114/78 | Ht 62.0 in | Wt 190.0 lb

## 2014-10-25 DIAGNOSIS — M170 Bilateral primary osteoarthritis of knee: Secondary | ICD-10-CM

## 2014-10-25 NOTE — Addendum Note (Signed)
Addended by: Adella HareBOOTHE, Erdem Naas B on: 10/25/2014 04:37 PM   Modules accepted: Level of Service

## 2014-10-25 NOTE — Patient Instructions (Signed)
Continue current medications. 

## 2014-10-25 NOTE — Progress Notes (Signed)
Patient ID: Stephanie [name], female   DOB: [birth year], 64 y.o.   MRN: [id number] Chief Complaint  Patient presents with  . 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

## 2014-11-26 ENCOUNTER — Other Ambulatory Visit (HOSPITAL_COMMUNITY): Payer: Self-pay | Admitting: Internal Medicine

## 2014-11-26 DIAGNOSIS — Z1231 Encounter for screening mammogram for malignant neoplasm of breast: Secondary | ICD-10-CM

## 2014-12-21 ENCOUNTER — Ambulatory Visit (HOSPITAL_COMMUNITY)
Admission: RE | Admit: 2014-12-21 | Discharge: 2014-12-21 | Disposition: A | Discharge status: Home / Self Care | Payer: BLUE CROSS/BLUE SHIELD | Source: Ambulatory Visit | Attending: Internal Medicine | Admitting: Internal Medicine

## 2014-12-21 DIAGNOSIS — Z1231 Encounter for screening mammogram for malignant neoplasm of breast: Secondary | ICD-10-CM | POA: Diagnosis not present

## 2015-04-30 ENCOUNTER — Ambulatory Visit (INDEPENDENT_AMBULATORY_CARE_PROVIDER_SITE_OTHER): Payer: BLUE CROSS/BLUE SHIELD | Admitting: Orthopedic Surgery

## 2015-04-30 ENCOUNTER — Encounter: Payer: Self-pay | Admitting: Orthopedic Surgery

## 2015-04-30 VITALS — BP 108/71 | Ht 62.0 in | Wt 198.0 lb

## 2015-04-30 DIAGNOSIS — M170 Bilateral primary osteoarthritis of knee: Secondary | ICD-10-CM

## 2015-04-30 NOTE — Progress Notes (Signed)
Patient ID: Stephanie [name], female   DOB: Sep 19, 1950, 64 y.o.   MRN: [id number]  Follow up visit  Chief Complaint  Patient presents with  . Follow-up    6 month follow up bilateral knees s/p meds    BP 108/71 mmHg  Ht  (1.575 m)  Wt 198 lb (89.812 kg)  BMI 36.21 kg/m2  Diagnosis primary osteoarthritis both knees  The patient is really asymptomatic except for stiffness. She did stop diclofenac as it was interfering with her renal function  No tenderness in either knee knee flexion is normal in both knees. Vitals are stable. Gait pattern is normal  Improved knee function  Follow-up 6 months

## 2015-10-29 ENCOUNTER — Ambulatory Visit (INDEPENDENT_AMBULATORY_CARE_PROVIDER_SITE_OTHER): Payer: BLUE CROSS/BLUE SHIELD | Admitting: Orthopedic Surgery

## 2015-10-29 VITALS — BP 149/90 | HR 111 | Ht 62.0 in | Wt 198.0 lb

## 2015-10-29 DIAGNOSIS — M170 Bilateral primary osteoarthritis of knee: Secondary | ICD-10-CM

## 2015-10-29 NOTE — Progress Notes (Signed)
Chief Complaint  Patient presents with  . Follow-up    Follow up bilateral knnes   HPI bilateral knee pain follow-up patient never really has a lot of pain and still is. But she is noticing functional deficits such as climbing the stairs and getting out of a chair. The left knee also has a catching and that she can manipulate the knee back into place and as far as the right knee goes it is just a little more painful than the left  Review of Systems  Constitutional: Negative for fever, chills and malaise/fatigue.  Musculoskeletal: Positive for joint pain. Negative for falls.  Neurological: Negative for tingling.    Past Medical History  Diagnosis Date  . GERD (gastroesophageal reflux disease)   . Hypercholesteremia   . Diabetes mellitus without complication     Past Surgical History  Procedure Laterality Date  . Abdominal hysterectomy    . Tubal ligation    . Colonoscopy N/A 12/26/2012    Procedure: COLONOSCOPY;  Surgeon: Corbin Adeobert M Rourk, MD;  Location: [facility];  Service: Endoscopy;  Laterality: N/A;  9:15 AM   Family History  Problem Relation Age of Onset  . Colon cancer Neg Hx    Social History  Substance Use Topics  . Smoking status: Never Smoker   . Smokeless tobacco: Not on file  . Alcohol Use: No    Current outpatient prescriptions:  .  aspirin 81 MG tablet, Take 81 mg by mouth daily., Disp: , Rfl:  .  glipiZIDE (GLUCOTROL XL) 5 MG 24 hr tablet, Take 5 mg by mouth daily with breakfast., Disp: , Rfl:  .  levothyroxine (SYNTHROID, LEVOTHROID) 50 MCG tablet, Take 50 mcg by mouth daily before breakfast., Disp: , Rfl:  .  LOSARTAN POTASSIUM-HCTZ PO, Take by mouth., Disp: , Rfl:  .  metFORMIN (GLUCOPHAGE) 1000 MG tablet, Take 1,000 mg by mouth 2 (two) times daily with a meal., Disp: , Rfl:  .  pravastatin (PRAVACHOL) 40 MG tablet, Take 40 mg by mouth daily., Disp: , Rfl:  .  ranitidine (ZANTAC) 150 MG tablet, Take 150 mg by mouth daily as needed for heartburn. Only  occasionally, Disp: , Rfl:   BP 149/90 mmHg  Pulse 111  Ht 5\' 2"  (1.575 m)  Wt 198 lb (89.812 kg)  BMI 36.21 kg/m2  Physical Exam  Constitutional: She is oriented to person, place, and time. She appears well-developed and well-nourished. No distress.  Cardiovascular: Normal rate and intact distal pulses.   Musculoskeletal:       Right knee: She exhibits no effusion.       Left knee: She exhibits no effusion.  Neurological: She is alert and oriented to person, place, and time. She has normal reflexes. She exhibits normal muscle tone. Coordination normal.  Skin: Skin is warm and dry. No rash noted. She is not diaphoretic. No erythema. No pallor.  Psychiatric: She has a normal mood and affect. Her behavior is normal. Judgment and thought content normal.    Right Knee Exam   Tenderness  The patient is experiencing tenderness in the medial joint line.  Range of Motion  Extension: -5  Flexion: 110   Tests  Drawer:       Anterior - negative    Posterior - negative Varus: negative Valgus: negative  Other  Erythema: absent Scars: absent Sensation: normal Pulse: present Swelling: none Other tests: no effusion present   Left Knee Exam   Tenderness  The patient is experiencing tenderness in  the medial joint line.  Range of Motion  Extension: -5  Flexion: 110   Tests  Drawer:       Anterior - negative     Posterior - negative Varus: negative Valgus: negative  Other  Erythema: absent Scars: absent Sensation: normal Pulse: present Swelling: none Effusion: no effusion present       ASSESSMENT: Prior imaging shows severe arthritis both knees but she's never really as symptomatic as her x-rays would suggest  PLAN Return for preop for right total knee or left total knee depending on which one is bothering her the most. We will x-ray the knee which is more symptomatic appropriate preoperative information distributed

## 2015-10-29 NOTE — Patient Instructions (Signed)
Preparing for Knee Replacement Recovery from knee replacement surgery can be made easier and more comfortable by being prepared before surgery. This includes:   Arranging for others to help you.  Preparing your home.  Preparing your body by getting a preoperative exam and being as healthy as you can.  Doing exercises before your surgery as directed by your health care provider. You can ease any concerns about your financial responsibilities by calling your insurance company after you decide to have surgery. In addition to asking about your surgery and hospital stay, you will want to ask about coverage for medical equipment, rehabilitation facilities, and home care.  HOW SHOULD I ARRANGE FOR HELP?  You will be stronger and more mobile every day. However, in the first couple weeks after surgery, it is unlikely you will be able to do all your daily activities as easily as before your surgery. You may tire easily and will still have limited movement in your leg. Follow these guidelines to best arrange for the help you may need after your surgery:   Plan to have someone take you home after the procedure. Your health care provider will be able to tell you how many days you can expect to be in the hospital.  Cancel all work, caregiving, and volunteer responsibilities for at least 4-6 weeks after your surgery.  If you live alone, arrange for someone to care for your home and pets for the first 4-6 weeks after surgery.  Select someone with whom you feel comfortable to be with you day and night for the first week. This person will help you with your exercises and personal care, such as bathing and using the toilet.  Arrange for drivers to bring you to and from your follow-up appointments, the grocery store, and other places you may need to go for at least 4-6 weeks. HOW SHOULD I PREPARE MY HOME?   Pick a recovery spot, but do not plan on recovering in bed. Sitting upright is better for your health.  You may want to use a recliner with a small table nearby. Place the items you use most frequently on that table. These may include the TV remote, a cordless phone, a book or laptop computer, a water glass, and any other items of your choice.  Remove all clutter from your floors. Also remove any throw rugs.  To see if you will be able to move in your home with a wheeled walker, hold your hands out about 6 inches (15 cm) from your sides. Walk from your recovery spot to your kitchen and bathroom. Then walk from your bed to the bathroom. If you do not hit anything with your hands, you have enough room.  Move the items you use most often in your kitchen, bathroom, and bedroom to shelves and drawers that are at countertop height.  Prepare a few meals to freeze and reheat later.  Consider adding grab bars in the shower and near the toilet.  While you are in the hospital, you will learn about equipment that can be helpful for your recovery. Some of the equipment includes raised toilet seats, tub benches, and shower benches.  HOW SHOULD I PREPARE MY BODY?   Have a preoperative exam. This will ensure that your body is healthy enough to safely have this surgery. Bring a complete list of all your medicines and supplements, including herbs and vitamins. You may need to have additional tests to ensure your safety.  Have elective dental care and routine   cleanings completed before your surgery. Germs from anywhere in your body, including your mouth, can travel to your new joint and infect it. It is important not to have any dental work performed for at least 3 months after your surgery. After surgery, be sure to tell your dentist about your joint replacement.  Maintain a healthy diet. Do not change your diet before surgery unless advised to do so by your health care provider.  Do not use any tobacco products, including cigarettes, chewing tobacco, or electronic cigarettes. If you need help quitting, ask your  health care provider. Tobacco and nicotine products can delay healing after your surgery.  The day before your surgery, follow your health care provider's directions for showering, eating, drinking, and taking medicines. These directions are for your safety. WHAT KINDS OF EXERCISES SHOULD I DO? Your health care provider may have you do the following exercises before your surgery. Be sure to follow the exercise program only as directed by your health care provider. While completing these exercises, remember to stretch for as Stephanie [name] as you can, up to 30 seconds. You should only feel a gentle lengthening or release in the stretched tissue. You should not feel pain.  Ankle Pumps  While sitting on a firm surface with your legs straight out in front of you, move your feet at the ankle joints so that your toes are pulling back toward your chest.  Reverse the motion, pointing your toes away from you.  Repeat 10-20 times. Complete this exercise 1-2 times per day. Heel Slides 1. Lie on your back with both knees straight. (If this causes back pain, bend one knee, placing your foot flat on the floor. Keep this leg in this position while doing heel slides with the opposite leg.) 2. Slowly slide one heel back toward your buttocks until you feel a gentle stretch in the front of your knee or thigh. 3. Slowly slide your heel back to the starting position. 4. Repeat 10-20 times, then switch heels and do it again. Complete this exercise 1-2 times per day. Quadriceps Sets 1. Lie on your back with one leg extended and your opposite knee bent.  2. Gradually tighten the muscles in the front of the thigh of your extended leg. This motion will push the back of the knee down toward the floor.  3. Hold the muscle as tightly as you can without causing pain for 10 seconds. 4. Relax the muscles slowly and completely in between each repetition. 5. Repeat 10-20 times, then switch legs and do it again. Complete this exercise  1-2 times per day. Short Arc Kicks 1. Lie on your back. Place a rolled towel (4-6 inches [10-15 cm] high) under one knee so that the knee slightly bends. 2. Raise only the lower leg of your slightly bent leg by tightening the muscles in the front of your thigh. Do not allow your thigh to rise. 3. Hold this position for 5 seconds. 4. Repeat 10-20 times, then switch legs and do it again. Complete this exercise 1-2 times per day. Straight Leg Raises 1. Lie on your back with one leg extended and your opposite knee bent. 2. Tighten the muscles in the front of the thigh of your extended leg. Your thigh may shake slightly. 3. Tighten these muscles even more and raise your leg 4-6 inches off the floor. Hold for 3-5 seconds. 4. Keeping these muscles tight, lower your leg. 5. Relax the muscles slowly and completely in between each repetition.  6.   Repeat 10-20 times, then switch legs and do it again. Complete this exercise 1-2 times per day. Arm Chair Push-ups 1. Find a firm, non-wheeled chair with solid armrests. 2. Sitting in the chair, extend one leg straight out in front of you. 3. Lift up your body weight, using your arms and opposite leg. 4. Slowly lower your body weight. 5. Repeat 10-20 times, then switch legs and do it again. Complete this exercise 1-2 times per day.   This information is not intended to replace advice given to you by your health care provider. Make sure you discuss any questions you have with your health care provider.   Document Released: 11/07/2010 Document Revised: 08/24/2014 Document Reviewed: 10/26/2013 Elsevier Interactive Patient Education 2016 [employer] Total Knee Replacement Total knee replacement is a procedure to replace your knee joint with an artificial knee joint (prosthetic knee joint). The purpose of this surgery is to reduce pain and improve your knee function. LET YOUR HEALTH CARE PROVIDER KNOW ABOUT:   Any allergies you have.  All medicines you  are taking, including vitamins, herbs, eye drops, creams, and over-the-counter medicines.  Previous problems you or members of your family have had with the use of anesthetics.  Any blood disorders you have.  Previous surgeries you have had.  Medical conditions you have. RISKS AND COMPLICATIONS  Generally, total knee replacement is a safe procedure. However, problems can occur, including:  Loss of range of motion of the knee or instability of the knee.  Loosening of the prosthesis.  Infection.  Persistent pain. BEFORE THE PROCEDURE   Plan to have someone take you home after the procedure.  Do not eat or drink anything after midnight on the night before the procedure or as directed by your health care provider.  Ask your health care provider about:  Changing or stopping your regular medicines. This is especially important if you are taking diabetes medicines or blood thinners.  Taking medicines such as aspirin and ibuprofen. These medicines can thin your blood. Do not take these medicines before your procedure if your health care provider asks you not to.  Ask your health care provider about how your surgical site will be marked or identified.  You may be given antibiotic medicines to help prevent infection. PROCEDURE   To reduce your risk of infection:  Your health care team will wash or sanitize their hands.  Your skin will be washed with soap.  An IV tube will be inserted into one of your veins. You will be given one or more of the following:  A medicine that makes you drowsy (sedative).  A medicine that makes you fall asleep (general anesthetic).  A medicine injected into your spine that numbs your body below the waist (spinal anesthetic).  A medicine to block feeling in your leg (nerve block) to help ease pain after surgery.  An incision will be made in your knee. Your surgeon will take out any damaged cartilage and bone by sawing off the damaged  surfaces.  The surgeon will then put a new metal liner over the sawed-off portion of your thigh bone (femur) and a plastic liner over the sawed-off portion of one of the bones of your lower leg (tibia). This is to restore alignment and function to your knee. A plastic piece is often used to restore the surface of your knee cap. AFTER THE PROCEDURE   You will stay in a recovery area until your medicines wear off.  You may have drainage   tubes to drain excess fluid from your knee. These tubes attach to a device that removes these fluids.  Once you are awake, stable, and taking fluids well, you will be taken to your hospital room.  You may be directed to take actions to help prevent blood clots. These may include:  Walking shortly after surgery, with someone assisting you. Moving around after surgery helps to improve blood flow.  Wearing compression stockings or using different types of devices.  You will receive physical therapy as prescribed by your health care provider.   This information is not intended to replace advice given to you by your health care provider. Make sure you discuss any questions you have with your health care provider.   Document Released: 11/09/2000 Document Revised: 04/24/2015 Document Reviewed: 09/13/2011 Elsevier Interactive Patient Education 2016 [employer] Inc[employer]  

## 2015-11-12 ENCOUNTER — Other Ambulatory Visit (HOSPITAL_COMMUNITY): Payer: Self-pay | Admitting: Internal Medicine

## 2015-11-12 DIAGNOSIS — R748 Abnormal levels of other serum enzymes: Secondary | ICD-10-CM

## 2015-11-14 ENCOUNTER — Ambulatory Visit (HOSPITAL_COMMUNITY)
Admission: RE | Admit: 2015-11-14 | Discharge: 2015-11-14 | Disposition: A | Discharge status: Home / Self Care | Payer: BLUE CROSS/BLUE SHIELD | Source: Ambulatory Visit | Attending: Internal Medicine | Admitting: Internal Medicine

## 2015-11-14 DIAGNOSIS — R160 Hepatomegaly, not elsewhere classified: Secondary | ICD-10-CM | POA: Insufficient documentation

## 2015-11-14 DIAGNOSIS — R748 Abnormal levels of other serum enzymes: Secondary | ICD-10-CM | POA: Diagnosis not present

## 2015-11-22 ENCOUNTER — Other Ambulatory Visit (HOSPITAL_COMMUNITY): Payer: Self-pay | Admitting: Internal Medicine

## 2015-11-22 DIAGNOSIS — Z1231 Encounter for screening mammogram for malignant neoplasm of breast: Secondary | ICD-10-CM

## 2015-12-25 ENCOUNTER — Ambulatory Visit (HOSPITAL_COMMUNITY)
Admission: RE | Admit: 2015-12-25 | Discharge: 2015-12-25 | Disposition: A | Discharge status: Home / Self Care | Payer: BLUE CROSS/BLUE SHIELD | Source: Ambulatory Visit | Attending: Internal Medicine | Admitting: Internal Medicine

## 2015-12-25 DIAGNOSIS — Z1231 Encounter for screening mammogram for malignant neoplasm of breast: Secondary | ICD-10-CM | POA: Diagnosis not present

## 2016-01-20 ENCOUNTER — Ambulatory Visit (INDEPENDENT_AMBULATORY_CARE_PROVIDER_SITE_OTHER): Payer: BLUE CROSS/BLUE SHIELD | Admitting: Orthopedic Surgery

## 2016-01-20 ENCOUNTER — Ambulatory Visit (INDEPENDENT_AMBULATORY_CARE_PROVIDER_SITE_OTHER): Payer: BLUE CROSS/BLUE SHIELD

## 2016-01-20 VITALS — BP 124/76 | HR 106 | Ht 62.0 in | Wt 198.0 lb

## 2016-01-20 DIAGNOSIS — M1710 Unilateral primary osteoarthritis, unspecified knee: Secondary | ICD-10-CM

## 2016-01-20 DIAGNOSIS — M129 Arthropathy, unspecified: Secondary | ICD-10-CM

## 2016-01-20 NOTE — Addendum Note (Signed)
Addended by: Adella HareBOOTHE, Lurleen Soltero B on: 01/20/2016 01:18 PM   Modules accepted: Orders

## 2016-01-20 NOTE — Progress Notes (Signed)
Chief Complaint  Patient presents with  . Follow-up    Recheck on bilateral knees.   HPI 65 year old female followed long time for bilateral knee pain. The left is worse than the right. She's had adequate nonoperative treatment including oral pain medication with anti-inflammatories and opioid management. She's had injections in the knees as well.  Her left knee pain has now become unbearable for her severe, greater than 6 months, pain is now constant, worse with activity, dull ache medial joint.  Review of Systems  Constitutional: Negative for fever.  HENT: Negative for congestion and ear pain.   Eyes: Negative for blurred vision.  Respiratory: Negative for cough.   Cardiovascular: Negative for chest pain.  Gastrointestinal: Negative for heartburn.  Genitourinary: Negative for dysuria.  Musculoskeletal: Positive for joint pain.  Skin: Negative for itching and rash.  Neurological: Negative for dizziness, sensory change and headaches.  Endo/Heme/Allergies: Does not bruise/bleed easily.  Psychiatric/Behavioral: Negative for depression.  All other systems reviewed and are negative.   Past Medical History  Diagnosis Date  . GERD (gastroesophageal reflux disease)   . Hypercholesteremia   . Diabetes mellitus without complication     Past Surgical History  Procedure Laterality Date  . Abdominal hysterectomy    . Tubal ligation    . Colonoscopy N/A 12/26/2012    Procedure: COLONOSCOPY;  Surgeon: Corbin Ade, MD;  Location: [facility];  Service: Endoscopy;  Laterality: N/A;  9:15 AM   Family History  Problem Relation Age of Onset  . Colon cancer Neg Hx    Social History  Substance Use Topics  . Smoking status: Never Smoker   . Smokeless tobacco: Not on file  . Alcohol Use: No    Current outpatient prescriptions:  .  aspirin 81 MG tablet, Take 81 mg by mouth daily., Disp: , Rfl:  .  glipiZIDE (GLUCOTROL XL) 5 MG 24 hr tablet, Take 5 mg by mouth daily with breakfast.,  Disp: , Rfl:  .  levothyroxine (SYNTHROID, LEVOTHROID) 50 MCG tablet, Take 50 mcg by mouth daily before breakfast., Disp: , Rfl:  .  LOSARTAN POTASSIUM-HCTZ PO, Take by mouth., Disp: , Rfl:  .  metFORMIN (GLUCOPHAGE) 1000 MG tablet, Take 1,000 mg by mouth 2 (two) times daily with a meal., Disp: , Rfl:  .  pravastatin (PRAVACHOL) 40 MG tablet, Take 40 mg by mouth daily., Disp: , Rfl:  .  ranitidine (ZANTAC) 150 MG tablet, Take 150 mg by mouth daily as needed for heartburn. Only occasionally, Disp: , Rfl:   BP 124/76 mmHg  Pulse 106  Ht  (1.575 m)  Wt 198 lb (89.812 kg)  BMI 36.21 kg/m2  Physical Exam  Constitutional: She is oriented to person, place, and time. She appears well-developed and well-nourished. No distress.  Cardiovascular: Normal rate and intact distal pulses.   Neurological: She is alert and oriented to person, place, and time. She has normal reflexes. She exhibits normal muscle tone. Coordination normal.  Skin: Skin is warm and dry. No rash noted. She is not diaphoretic. No erythema. No pallor.  Psychiatric: She has a normal mood and affect. Her behavior is normal. Judgment and thought content normal.    Back Exam   Other  Gait: normal   Comments:  Left knee has medial joint line tenderness varus alignment, 5 flexion contracture knee flexion 120 measured. Stable collateral and coronal plane. Strength is normal in the quadriceps mechanism. The skin is clean. Distal pulses are intact no significant edema or  swelling in the left leg. She has normal sensation there as well.  Right knee is also symptomatic but not as bad. Muscle tone is normal       ASSESSMENT: My personal interpretation of the images:  Today's x-rays show varus alignment and severe medial wear of the left knee    PLAN Left total knee arthroplasty  This procedure has been fully reviewed with the patient and written informed consent has been obtained.

## 2016-01-20 NOTE — Patient Instructions (Addendum)
Left total knee replacement July 19   You have decided to proceed with knee replacement surgery. You have decided not to continue with nonoperative measures such as but not limited to oral medication, weight loss, activity modification, physical therapy, bracing, or injection.  We will perform the procedure commonly known as total knee replacement. Some of the risks associated with knee replacement surgery include but are not limited to Bleeding Infection Swelling Stiffness Blood clot Pain that persists even after surgery  Infection is especially devastating complication of knee surgery although rare. If infection does occur your implant will usually have to be removed and several surgeries and antibiotics will be needed to eradicate the infection prior to performing a repeat replacement.   If you're not comfortable with these risks and would like to continue with nonoperative treatment please let Dr. Romeo AppleHarrison know prior to your surgery.   Total Knee Replacement  Total knee replacement is a surgery to replace your damaged knee joint. Your knee joint is replaced with a man-made (artificial) knee joint. The man-made knee joint is called a prosthesis. This surgery is done to lessen pain and improve movement. BEFORE THE PROCEDURE   Do not eat or drink anything after midnight on the night before the procedure or as told by your doctor.  Ask your doctor about:  Changing or stopping your normal medicines. This is important if you take diabetes medicines or blood thinners.  Taking aspirin or ibuprofen medicines. These thin your blood. Do not take these medicines if your doctor tells you not to.  Plan to have someone take you home after the procedure.  Ask your health care team how your surgery site will be marked.  You may be given medicines that kill germs (antibiotics) to help prevent infection. PROCEDURE   To help prevent infection:  Your health care team will wash or sanitize  their hands.  Your skin will be washed with soap.  An IV tube will be put into one of your veins.  You will be given one or more of the following:  Sedative. This is a medicine that makes you relaxed.  General anesthetic. This is a medicine that makes you fall asleep.  Spinal anesthetic. This is a medicine that numbs your body below the waist.  Nerve block. This is a medicine to block feeling in your leg. This medicine helps to ease pain after surgery.  A cut (incision) will be made in the front of your knee. Your surgeon will take out any damaged parts of your knee joint.  Your surgeon will then:  Put new metal liner over the part of the thigh bone that is taken out.  Put a plastic liner over the shin bone.  Your surgeon may put a plastic piece over the surface of your knee cap.  The cut will be closed. A bandage will be placed over it. AFTER THE PROCEDURE   You will stay in a recovery area until your medicines wear off.  You may have tubes to drain fluid from your knee.  Once you are doing okay, you will be taken to your hospital room.  You may be told to take actions to help prevent blood clots. These may include:  Walking soon after surgery with someone helping you. Moving around helps to improve blood flow.  Taking medicines to thin your blood (anticoagulants).  Wearing special socks (compression stockings) or using other types of devices.  You may need to do exercise therapy (physical therapy) until you  are doing well. Your doctor will tell you when you are well enough to go home.   This information is not intended to replace advice given to you by your health care provider. Make sure you discuss any questions you have with your health care provider.   Document Released: 10/26/2011 Document Revised: 04/24/2015 Document Reviewed: 10/26/2011 Elsevier Interactive Patient Education [employer].

## 2016-02-25 NOTE — Patient Instructions (Signed)
Stephanie [name]  02/25/2016     @PREFPERIOPPHARMACY @   Your procedure is scheduled on 03/04/2016.  [facility]  Call this number if you have problems the morning of surgery:  [phone]   Remember:  Do not eat food or drink liquids after midnight.  Take these medicines the morning of surgery with A SIP OF WATER Synthroid, Zantac  DO NOT TAKE DIABETIC MEDICATION MORNING OF PROCEDURE   Do not wear jewelry, make-up or nail polish.  Do not wear lotions, powders, or perfumes.  You may wear deoderant.  Do not shave 48 hours prior to surgery.  Men may shave face and neck.  Do not bring valuables to the hospital.  [facility] any belongings or valuables.  Contacts, dentures or bridgework may not be worn into surgery.  Leave your suitcase in the car.  After surgery it may be brought to your room.  For patients admitted to the hospital, discharge time will be determined by your treatment team.  Patients discharged the day of surgery will not be allowed to drive home.    Please read over the following fact sheets that you were given. Surgical Site Infection Prevention and Anesthesia Post-op Instructions     PATIENT INSTRUCTIONS POST-ANESTHESIA  IMMEDIATELY FOLLOWING SURGERY:  Do not drive or operate machinery for the first twenty four hours after surgery.  Do not make any important decisions for twenty four hours after surgery or while taking narcotic pain medications or sedatives.  If you develop intractable nausea and vomiting or a severe headache please notify your doctor immediately.  FOLLOW-UP:  Please make an appointment with your surgeon as instructed. You do not need to follow up with anesthesia unless specifically instructed to do so.  WOUND CARE INSTRUCTIONS (if applicable):  Keep a dry clean dressing on the anesthesia/puncture wound site if there is drainage.  Once the wound has quit draining you may leave it open to air.   Generally you should leave the bandage intact for twenty four hours unless there is drainage.  If the epidural site drains for more than 36-48 hours please call the anesthesia department.  QUESTIONS?:  Please feel free to call your physician or the hospital operator if you have any questions, and they will be happy to assist you.      Total Knee Replacement Total knee replacement is a procedure to replace your knee joint with an artificial knee joint (prosthetic knee joint). The purpose of this surgery is to reduce pain and improve your knee function. LET [facility]ER KNOW ABOUT:   Any allergies you have.  All medicines you are taking, including vitamins, herbs, eye drops, creams, and over-the-counter medicines.  Previous problems you or members of your family have had with the use of anesthetics.  Any blood disorders you have.  Previous surgeries you have had.  Medical conditions you have. RISKS AND COMPLICATIONS  Generally, total knee replacement is a safe procedure. However, problems can occur, including:  Loss of range of motion of the knee or instability of the knee.  Loosening of the prosthesis.  Infection.  Persistent pain. BEFORE THE PROCEDURE   Plan to have someone take you home after the procedure.  Do not eat or drink anything after midnight on the night before the procedure or as directed by your health care provider.  Ask your health care provider about:  Changing or stopping your regular medicines. This is  especially important if you are taking diabetes medicines or blood thinners.  Taking medicines such as aspirin and ibuprofen. These medicines can thin your blood. Do not take these medicines before your procedure if your health care provider asks you not to.  Ask your health care provider about how your surgical site will be marked or identified.  You may be given antibiotic medicines to help prevent infection. PROCEDURE   To reduce your  risk of infection:  Your health care team will wash or sanitize their hands.  Your skin will be washed with soap.  An IV tube will be inserted into one of your veins. You will be given one or more of the following:  A medicine that makes you drowsy (sedative).  A medicine that makes you fall asleep (general anesthetic).  A medicine injected into your spine that numbs your body below the waist (spinal anesthetic).  A medicine to block feeling in your leg (nerve block) to help ease pain after surgery.  An incision will be made in your knee. Your surgeon will take out any damaged cartilage and bone by sawing off the damaged surfaces.  The surgeon will then put a new metal liner over the sawed-off portion of your thigh bone (femur) and a plastic liner over the sawed-off portion of one of the bones of your lower leg (tibia). This is to restore alignment and function to your knee. A plastic piece is often used to restore the surface of your knee cap. AFTER THE PROCEDURE   You will stay in a recovery area until your medicines wear off.  You may have drainage tubes to drain excess fluid from your knee. These tubes attach to a device that removes these fluids.  Once you are awake, stable, and taking fluids well, you will be taken to your hospital room.  You may be directed to take actions to help prevent blood clots. These may include:  Walking shortly after surgery, with someone assisting you. Moving around after surgery helps to improve blood flow.  Wearing compression stockings or using different types of devices.  You will receive physical therapy as prescribed by your health care provider.   This information is not intended to replace advice given to you by your health care provider. Make sure you discuss any questions you have with your health care provider.   Document Released: 11/09/2000 Document Revised: 04/24/2015 Document Reviewed: 09/13/2011 Elsevier Interactive Patient  Education [employer]

## 2016-02-26 ENCOUNTER — Telehealth: Payer: Self-pay | Admitting: *Deleted

## 2016-02-26 NOTE — Telephone Encounter (Signed)
Authr # [id number] [facility] K[facility] for 03/04/16. [facility]

## 2016-02-28 ENCOUNTER — Encounter (HOSPITAL_COMMUNITY)
Admission: RE | Admit: 2016-02-28 | Discharge: 2016-02-28 | Disposition: A | Discharge status: Home / Self Care | Payer: BLUE CROSS/BLUE SHIELD | Source: Ambulatory Visit | Attending: Orthopedic Surgery | Admitting: Orthopedic Surgery

## 2016-02-28 ENCOUNTER — Encounter (HOSPITAL_COMMUNITY): Payer: Self-pay

## 2016-02-28 DIAGNOSIS — Z01810 Encounter for preprocedural cardiovascular examination: Secondary | ICD-10-CM | POA: Diagnosis present

## 2016-02-28 DIAGNOSIS — Z01812 Encounter for preprocedural laboratory examination: Secondary | ICD-10-CM | POA: Diagnosis not present

## 2016-02-28 LAB — CBC WITH DIFFERENTIAL/PLATELET
BASOS ABS: 0 10*3/uL (ref 0.0–0.1)
BASOS PCT: 0 %
EOS ABS: 0.3 10*3/uL (ref 0.0–0.7)
Eosinophils Relative: 5 %
HEMATOCRIT: 35.6 % — AB (ref 36.0–46.0)
HEMOGLOBIN: 11.4 g/dL — AB (ref 12.0–15.0)
Lymphocytes Relative: 39 %
Lymphs Abs: 2.4 10*3/uL (ref 0.7–4.0)
MCH: 27.8 pg (ref 26.0–34.0)
MCHC: 32 g/dL (ref 30.0–36.0)
MCV: 86.8 fL (ref 78.0–100.0)
Monocytes Absolute: 0.3 10*3/uL (ref 0.1–1.0)
Monocytes Relative: 5 %
Neutro Abs: 3.2 10*3/uL (ref 1.7–7.7)
Neutrophils Relative %: 51 %
Platelets: 290 10*3/uL (ref 150–400)
RBC: 4.1 MIL/uL (ref 3.87–5.11)
RDW: 14.9 % (ref 11.5–15.5)
WBC: 6.2 10*3/uL (ref 4.0–10.5)

## 2016-02-28 LAB — BASIC METABOLIC PANEL WITH GFR
Anion gap: 10 (ref 5–15)
BUN: 18 mg/dL (ref 6–20)
CO2: 23 mmol/L (ref 22–32)
Calcium: 9.7 mg/dL (ref 8.9–10.3)
Chloride: 106 mmol/L (ref 101–111)
Creatinine, Ser: 0.92 mg/dL (ref 0.44–1.00)
GFR calc Af Amer: >60 mL/min (ref >60)
GFR calc non Af Amer: >60 mL/min (ref >60)
Glucose, Bld: 133 mg/dL — ABNORMAL HIGH (ref 65–99)
Potassium: 4.4 mmol/L (ref 3.5–5.1)
Sodium: 139 mmol/L (ref 135–145)

## 2016-02-28 LAB — ABO/RH: ABO/RH(D): B POS

## 2016-02-28 LAB — SURGICAL PCR SCREEN
MRSA, PCR: NEGATIVE
STAPHYLOCOCCUS AUREUS: POSITIVE — AB

## 2016-02-28 LAB — PROTIME-INR
INR: 1.08 (ref 0.00–1.49)
PROTHROMBIN TIME: 14.2 s (ref 11.6–15.2)

## 2016-02-28 LAB — PREPARE RBC (CROSSMATCH)

## 2016-02-28 LAB — APTT: APTT: 29 s (ref 24–37)

## 2016-02-28 MED ORDER — MUPIROCIN 2 % EX OINT
TOPICAL_OINTMENT | CUTANEOUS | Status: AC
  Filled 2016-02-28: qty 22

## 2016-03-03 NOTE — H&P (Addendum)
Chief Complaint   Patient presents with   .  Left knee pain         HPI 65 year old female followed long time for Left knee pain. She's had adequate nonoperative treatment including oral pain medication with anti-inflammatories and opioid management. She's had injections in the knees as well.  Her left knee pain has now become unbearable for her, it is severe, greater than 6 months, pain is now constant, worse with activity, she describes it as a dull ache medial joint.  Review of Systems  Constitutional: Negative for fever.  HENT: Negative for congestion and ear pain.   Eyes: Negative for blurred vision.  Respiratory: Negative for cough.   Cardiovascular: Negative for chest pain.  Gastrointestinal: Negative for heartburn.  Genitourinary: Negative for dysuria.  Musculoskeletal: Positive for joint pain.  Skin: Negative for itching and rash.  Neurological: Negative for dizziness, sensory change and headaches.  Endo/Heme/Allergies: Does not bruise/bleed easily.  Psychiatric/Behavioral: Negative for depression.  All other systems reviewed and are negative.     Past Medical History   Diagnosis  Date   .  GERD (gastroesophageal reflux disease)     .  Hypercholesteremia     .  Diabetes mellitus without complication         Past Surgical History   Procedure  Laterality  Date   .  Abdominal hysterectomy       .  Tubal ligation       .  Colonoscopy  N/A  12/26/2012       Procedure: COLONOSCOPY;  Surgeon: Corbin Ade, MD;  Location: [facility];  Service: Endoscopy;  Laterality: N/A;  9:15 AM    Family History   Problem  Relation  Age of Onset   .  Colon cancer  Neg Hx      Social History   Substance Use Topics   .  Smoking status:  Never Smoker    .  Smokeless tobacco:  Not on file   .  Alcohol Use:  No     Current outpatient prescriptions:   .  aspirin 81 MG tablet, Take 81 mg by mouth daily., Disp: , Rfl:   .  glipiZIDE (GLUCOTROL XL) 5 MG 24 hr tablet, Take 5 mg by  mouth daily with breakfast., Disp: , Rfl:   .  levothyroxine (SYNTHROID, LEVOTHROID) 50 MCG tablet, Take 50 mcg by mouth daily before breakfast., Disp: , Rfl:   .  LOSARTAN POTASSIUM-HCTZ PO, Take by mouth., Disp: , Rfl:   .  metFORMIN (GLUCOPHAGE) 1000 MG tablet, Take 1,000 mg by mouth 2 (two) times daily with a meal., Disp: , Rfl:   .  pravastatin (PRAVACHOL) 40 MG tablet, Take 40 mg by mouth daily., Disp: , Rfl:   .  ranitidine (ZANTAC) 150 MG tablet, Take 150 mg by mouth daily as needed for heartburn. Only occasionally, Disp: , Rfl:   BP 124/76 mmHg  Pulse 106  Ht  (1.575 m)  Wt 198 lb (89.812 kg)  BMI 36.21 kg/m2  Physical Exam  Constitutional: She is oriented to person, place, and time. She appears well-developed and well-nourished. No distress.  Cardiovascular: Normal rate and intact distal pulses.   Neurological: She is alert and oriented to person, place, and time. She has normal reflexes. She exhibits normal muscle tone. Coordination normal.  Skin: Skin is warm and dry. No rash noted. She is not diaphoretic. No erythema. No pallor.  Psychiatric: She has a normal mood and  affect. Her behavior is normal. Judgment and thought content normal.    Other  Gait: normal     Left knee has medial joint line tenderness varus alignment, 5 flexion contracture knee flexion 120 measured. Stable collateral and coronal plane. Strength is normal in the quadriceps mechanism. The skin is clean. Distal pulses are intact no significant edema or swelling in the left leg. She has normal sensation there as well.  Right knee is also symptomatic but not as bad. Muscle tone is normal  Her upper extremities show normal alignment no asymmetry crepitation defects masses or effusion, there are no joint contractures or crepitation and the joints are stable without dislocation subluxation or laxity. Muscle tone and strength are normal with no abnormal movements or atrophy   ASSESSMENT: My personal  interpretation of the images:   Today's x-rays show varus alignment and severe medial wear of the left knee  Diagnosis osteoarthritis left knee  Plan left total knee arthroplasty  This procedure has been fully reviewed with the patient and written informed consent has been obtained.  BMET    Component Value Date/Time   NA 139 02/28/2016 1100   K 4.4 02/28/2016 1100   CL 106 02/28/2016 1100   CO2 23 02/28/2016 1100   GLUCOSE 133* 02/28/2016 1100   BUN 18 02/28/2016 1100   CREATININE 0.92 02/28/2016 1100   CALCIUM 9.7 02/28/2016 1100   GFRNONAA >60 02/28/2016 1100   GFRAA >60 02/28/2016 1100     CBC Latest Ref Rng 02/28/2016  WBC 4.0 - 10.5 K/uL 6.2  Hemoglobin 12.0 - 15.0 g/dL 11.4(L)  Hematocrit 36.0 - 46.0 % 35.6(L)  Platelets 150 - 400 K/uL 290    Fuller CanadaStanley Galvin Aversa, MD 03/03/2016 4:05 PM

## 2016-03-04 ENCOUNTER — Encounter (HOSPITAL_COMMUNITY)
Admission: RE | Disposition: A | Discharge status: Home Health | Payer: Self-pay | Source: Ambulatory Visit | Attending: Orthopedic Surgery

## 2016-03-04 ENCOUNTER — Inpatient Hospital Stay (HOSPITAL_COMMUNITY): Payer: BLUE CROSS/BLUE SHIELD

## 2016-03-04 ENCOUNTER — Inpatient Hospital Stay (HOSPITAL_COMMUNITY): Payer: BLUE CROSS/BLUE SHIELD | Admitting: Anesthesiology

## 2016-03-04 ENCOUNTER — Encounter (HOSPITAL_COMMUNITY): Payer: Self-pay | Admitting: *Deleted

## 2016-03-04 ENCOUNTER — Inpatient Hospital Stay (HOSPITAL_COMMUNITY)
Admission: RE | Admit: 2016-03-04 | Discharge: 2016-03-06 | DRG: 470 | Disposition: A | Discharge status: Home Health | Payer: BLUE CROSS/BLUE SHIELD | Source: Ambulatory Visit | Attending: Orthopedic Surgery | Admitting: Orthopedic Surgery

## 2016-03-04 DIAGNOSIS — E7800 Pure hypercholesterolemia, unspecified: Secondary | ICD-10-CM | POA: Diagnosis present

## 2016-03-04 DIAGNOSIS — M25562 Pain in left knee: Secondary | ICD-10-CM | POA: Diagnosis present

## 2016-03-04 DIAGNOSIS — E119 Type 2 diabetes mellitus without complications: Secondary | ICD-10-CM | POA: Diagnosis present

## 2016-03-04 DIAGNOSIS — M1712 Unilateral primary osteoarthritis, left knee: Principal | ICD-10-CM | POA: Diagnosis present

## 2016-03-04 DIAGNOSIS — K219 Gastro-esophageal reflux disease without esophagitis: Secondary | ICD-10-CM | POA: Diagnosis present

## 2016-03-04 DIAGNOSIS — Z7984 Long term (current) use of oral hypoglycemic drugs: Secondary | ICD-10-CM | POA: Diagnosis not present

## 2016-03-04 DIAGNOSIS — Z96652 Presence of left artificial knee joint: Secondary | ICD-10-CM | POA: Insufficient documentation

## 2016-03-04 DIAGNOSIS — M25572 Pain in left ankle and joints of left foot: Secondary | ICD-10-CM

## 2016-03-04 HISTORY — PX: TOTAL KNEE ARTHROPLASTY: SHX125

## 2016-03-04 HISTORY — DX: Nausea with vomiting, unspecified: Z98.890

## 2016-03-04 HISTORY — DX: Other specified postprocedural states: R11.2

## 2016-03-04 LAB — GLUCOSE, CAPILLARY
GLUCOSE-CAPILLARY: 137 mg/dL — AB (ref 65–99)
GLUCOSE-CAPILLARY: 165 mg/dL — AB (ref 65–99)
Glucose-Capillary: 130 mg/dL — ABNORMAL HIGH (ref 65–99)
Glucose-Capillary: 170 mg/dL — ABNORMAL HIGH (ref 65–99)

## 2016-03-04 SURGERY — ARTHROPLASTY, KNEE, TOTAL
Anesthesia: Spinal | Site: Knee | Laterality: Left

## 2016-03-04 MED ORDER — MIDAZOLAM HCL 2 MG/2ML IJ SOLN
INTRAMUSCULAR | Status: AC
  Filled 2016-03-04: qty 2

## 2016-03-04 MED ORDER — CHLORHEXIDINE GLUCONATE 4 % EX LIQD: 60.0000 mL | Freq: Once | CUTANEOUS | Status: DC

## 2016-03-04 MED ORDER — EPHEDRINE SULFATE 50 MG/ML IJ SOLN
INTRAMUSCULAR | Status: AC
  Filled 2016-03-04: qty 1

## 2016-03-04 MED ORDER — ASPIRIN 81 MG PO TABS: 81.0000 mg | ORAL_TABLET | Freq: Every day | ORAL | Status: DC

## 2016-03-04 MED ORDER — FAMOTIDINE 20 MG PO TABS
20.0000 mg | ORAL_TABLET | Freq: Every day | ORAL | Status: DC
  Administered 2016-03-04 – 2016-03-06 (×3): 20 mg via ORAL
  Filled 2016-03-04 (×3): qty 1

## 2016-03-04 MED ORDER — ONDANSETRON HCL 4 MG/2ML IJ SOLN
INTRAMUSCULAR | Status: AC
  Filled 2016-03-04: qty 2

## 2016-03-04 MED ORDER — SODIUM CHLORIDE 0.9 % IJ SOLN
INTRAMUSCULAR | Status: AC
  Filled 2016-03-04: qty 40

## 2016-03-04 MED ORDER — METHOCARBAMOL 1000 MG/10ML IJ SOLN: 500.0000 mg | Freq: Four times a day (QID) | INTRAVENOUS | Status: DC | PRN

## 2016-03-04 MED ORDER — DIPHENHYDRAMINE HCL 12.5 MG/5ML PO ELIX: 12.5000 mg | ORAL_SOLUTION | ORAL | Status: DC | PRN

## 2016-03-04 MED ORDER — FENTANYL CITRATE (PF) 100 MCG/2ML IJ SOLN
25.0000 ug | INTRAMUSCULAR | Status: DC
  Administered 2016-03-04: 25 ug via INTRAVENOUS
  Filled 2016-03-04: qty 2

## 2016-03-04 MED ORDER — ALUM & MAG HYDROXIDE-SIMETH 200-200-20 MG/5ML PO SUSP: 30.0000 mL | ORAL | Status: DC | PRN

## 2016-03-04 MED ORDER — SODIUM CHLORIDE 0.9 % IR SOLN
Status: DC | PRN
  Administered 2016-03-04: 3000 mL

## 2016-03-04 MED ORDER — BUPIVACAINE LIPOSOME 1.3 % IJ SUSP
20.0000 mL | Freq: Once | INTRAMUSCULAR | Status: DC
  Filled 2016-03-04: qty 20

## 2016-03-04 MED ORDER — METHOCARBAMOL 500 MG PO TABS: 500.0000 mg | ORAL_TABLET | Freq: Four times a day (QID) | ORAL | Status: DC | PRN

## 2016-03-04 MED ORDER — METFORMIN HCL 500 MG PO TABS
1000.0000 mg | ORAL_TABLET | Freq: Two times a day (BID) | ORAL | Status: DC
  Administered 2016-03-04 – 2016-03-06 (×4): 1000 mg via ORAL
  Filled 2016-03-04 (×4): qty 2

## 2016-03-04 MED ORDER — ONDANSETRON HCL 4 MG/2ML IJ SOLN
4.0000 mg | Freq: Four times a day (QID) | INTRAMUSCULAR | Status: DC | PRN
  Administered 2016-03-05 (×2): 4 mg via INTRAVENOUS
  Filled 2016-03-04 (×2): qty 2

## 2016-03-04 MED ORDER — LACTATED RINGERS IV SOLN
INTRAVENOUS | Status: DC
  Administered 2016-03-04: 07:00:00 via INTRAVENOUS

## 2016-03-04 MED ORDER — PHENYLEPHRINE 40 MCG/ML (10ML) SYRINGE FOR IV PUSH (FOR BLOOD PRESSURE SUPPORT)
PREFILLED_SYRINGE | INTRAVENOUS | Status: AC
  Filled 2016-03-04: qty 10

## 2016-03-04 MED ORDER — GLIPIZIDE ER 5 MG PO TB24
5.0000 mg | ORAL_TABLET | Freq: Every day | ORAL | Status: DC
  Administered 2016-03-05 – 2016-03-06 (×2): 5 mg via ORAL
  Filled 2016-03-04 (×2): qty 1

## 2016-03-04 MED ORDER — PHENYLEPHRINE HCL 10 MG/ML IJ SOLN
INTRAMUSCULAR | Status: DC | PRN
  Administered 2016-03-04 (×5): 80 ug via INTRAVENOUS

## 2016-03-04 MED ORDER — CEFAZOLIN SODIUM-DEXTROSE 2-4 GM/100ML-% IV SOLN
INTRAVENOUS | Status: AC
  Filled 2016-03-04: qty 100

## 2016-03-04 MED ORDER — SODIUM CHLORIDE 0.9 % IJ SOLN
INTRAMUSCULAR | Status: AC
  Filled 2016-03-04: qty 10

## 2016-03-04 MED ORDER — LACTATED RINGERS IV SOLN
INTRAVENOUS | Status: DC | PRN
  Administered 2016-03-04 (×2): via INTRAVENOUS

## 2016-03-04 MED ORDER — HYDROCODONE-ACETAMINOPHEN 10-325 MG PO TABS
1.0000 | ORAL_TABLET | ORAL | Status: DC
  Administered 2016-03-04 – 2016-03-06 (×12): 1 via ORAL
  Filled 2016-03-04 (×12): qty 1

## 2016-03-04 MED ORDER — PROPOFOL 500 MG/50ML IV EMUL
INTRAVENOUS | Status: DC | PRN
  Administered 2016-03-04: 50 ug/kg/min via INTRAVENOUS

## 2016-03-04 MED ORDER — BUPIVACAINE IN DEXTROSE 0.75-8.25 % IT SOLN
INTRATHECAL | Status: AC
  Filled 2016-03-04: qty 2

## 2016-03-04 MED ORDER — SODIUM CHLORIDE 0.9 % IV SOLN
1000.0000 mg | INTRAVENOUS | Status: AC
  Administered 2016-03-04: 1000 mg via INTRAVENOUS
  Filled 2016-03-04: qty 10

## 2016-03-04 MED ORDER — ONDANSETRON HCL 4 MG/2ML IJ SOLN
4.0000 mg | Freq: Once | INTRAMUSCULAR | Status: AC
  Administered 2016-03-04: 4 mg via INTRAVENOUS

## 2016-03-04 MED ORDER — CEFAZOLIN SODIUM-DEXTROSE 2-4 GM/100ML-% IV SOLN
2.0000 g | INTRAVENOUS | Status: AC
  Administered 2016-03-04: 2 g via INTRAVENOUS

## 2016-03-04 MED ORDER — 0.9 % SODIUM CHLORIDE (POUR BTL) OPTIME
TOPICAL | Status: DC | PRN
Start: 2016-03-04 — End: 2016-03-04
  Administered 2016-03-04: 1000 mL

## 2016-03-04 MED ORDER — BUPIVACAINE IN DEXTROSE 0.75-8.25 % IT SOLN
INTRATHECAL | Status: DC | PRN
  Administered 2016-03-04: 13 mg via INTRATHECAL

## 2016-03-04 MED ORDER — PHENOL 1.4 % MT LIQD: 1.0000 | OROMUCOSAL | Status: DC | PRN

## 2016-03-04 MED ORDER — CANAGLIFLOZIN 100 MG PO TABS
300.0000 mg | ORAL_TABLET | Freq: Every day | ORAL | Status: DC
  Administered 2016-03-05 – 2016-03-06 (×2): 300 mg via ORAL
  Filled 2016-03-04 (×7): qty 3

## 2016-03-04 MED ORDER — PRAVASTATIN SODIUM 40 MG PO TABS
40.0000 mg | ORAL_TABLET | Freq: Every day | ORAL | Status: DC
  Administered 2016-03-04 – 2016-03-06 (×3): 40 mg via ORAL
  Filled 2016-03-04 (×3): qty 1

## 2016-03-04 MED ORDER — FENTANYL CITRATE (PF) 100 MCG/2ML IJ SOLN
INTRAMUSCULAR | Status: DC | PRN
  Administered 2016-03-04 (×3): 25 ug via INTRAVENOUS
  Administered 2016-03-04: 25 ug via INTRATHECAL

## 2016-03-04 MED ORDER — OXYCODONE HCL 5 MG PO TABS: 5.0000 mg | ORAL_TABLET | ORAL | Status: DC | PRN

## 2016-03-04 MED ORDER — LEVOTHYROXINE SODIUM 75 MCG PO TABS
75.0000 ug | ORAL_TABLET | Freq: Every day | ORAL | Status: DC
  Administered 2016-03-05 – 2016-03-06 (×2): 75 ug via ORAL
  Filled 2016-03-04 (×2): qty 1

## 2016-03-04 MED ORDER — METOCLOPRAMIDE HCL 10 MG PO TABS: 5.0000 mg | ORAL_TABLET | Freq: Three times a day (TID) | ORAL | Status: DC | PRN

## 2016-03-04 MED ORDER — SUCCINYLCHOLINE CHLORIDE 20 MG/ML IJ SOLN
INTRAMUSCULAR | Status: AC
  Filled 2016-03-04: qty 1

## 2016-03-04 MED ORDER — DEXAMETHASONE SODIUM PHOSPHATE 4 MG/ML IJ SOLN
4.0000 mg | Freq: Once | INTRAMUSCULAR | Status: AC
  Administered 2016-03-04: 4 mg via INTRAVENOUS
  Filled 2016-03-04: qty 1

## 2016-03-04 MED ORDER — ASPIRIN EC 325 MG PO TBEC
325.0000 mg | DELAYED_RELEASE_TABLET | Freq: Every day | ORAL | Status: DC
  Administered 2016-03-05 – 2016-03-06 (×2): 325 mg via ORAL
  Filled 2016-03-04 (×2): qty 1

## 2016-03-04 MED ORDER — BUPIVACAINE LIPOSOME 1.3 % IJ SUSP
INTRAMUSCULAR | Status: AC
  Filled 2016-03-04: qty 20

## 2016-03-04 MED ORDER — ACETAMINOPHEN 325 MG PO TABS
650.0000 mg | ORAL_TABLET | Freq: Four times a day (QID) | ORAL | Status: DC | PRN
Start: 2016-03-04 — End: 2016-03-06

## 2016-03-04 MED ORDER — MIDAZOLAM HCL 5 MG/5ML IJ SOLN
INTRAMUSCULAR | Status: DC | PRN
  Administered 2016-03-04 (×2): 1 mg via INTRAVENOUS

## 2016-03-04 MED ORDER — BUPIVACAINE-EPINEPHRINE (PF) 0.5% -1:200000 IJ SOLN
INTRAMUSCULAR | Status: DC | PRN
  Administered 2016-03-04: 30 mL via PERINEURAL

## 2016-03-04 MED ORDER — ACETAMINOPHEN 650 MG RE SUPP: 650.0000 mg | Freq: Four times a day (QID) | RECTAL | Status: DC | PRN

## 2016-03-04 MED ORDER — MENTHOL 3 MG MT LOZG
1.0000 | LOZENGE | OROMUCOSAL | Status: DC | PRN
  Filled 2016-03-04: qty 9

## 2016-03-04 MED ORDER — MORPHINE SULFATE (PF) 4 MG/ML IV SOLN: 4.0000 mg | INTRAVENOUS | Status: DC | PRN

## 2016-03-04 MED ORDER — MIDAZOLAM HCL 2 MG/2ML IJ SOLN
1.0000 mg | INTRAMUSCULAR | Status: DC | PRN
  Administered 2016-03-04: 2 mg via INTRAVENOUS

## 2016-03-04 MED ORDER — LOSARTAN POTASSIUM 50 MG PO TABS
25.0000 mg | ORAL_TABLET | Freq: Every day | ORAL | Status: DC
  Administered 2016-03-05 – 2016-03-06 (×2): 25 mg via ORAL
  Filled 2016-03-04 (×2): qty 1

## 2016-03-04 MED ORDER — ONDANSETRON HCL 4 MG PO TABS: 4.0000 mg | ORAL_TABLET | Freq: Four times a day (QID) | ORAL | Status: DC | PRN

## 2016-03-04 MED ORDER — SODIUM CHLORIDE 0.9 % IV SOLN: INTRAVENOUS | Status: DC

## 2016-03-04 MED ORDER — ONDANSETRON HCL 4 MG/2ML IJ SOLN: 4.0000 mg | Freq: Once | INTRAMUSCULAR | Status: DC | PRN

## 2016-03-04 MED ORDER — PROPOFOL 10 MG/ML IV BOLUS
INTRAVENOUS | Status: AC
  Filled 2016-03-04: qty 40

## 2016-03-04 MED ORDER — FENTANYL CITRATE (PF) 100 MCG/2ML IJ SOLN: 25.0000 ug | INTRAMUSCULAR | Status: DC | PRN

## 2016-03-04 MED ORDER — METOCLOPRAMIDE HCL 5 MG/ML IJ SOLN: 5.0000 mg | Freq: Three times a day (TID) | INTRAMUSCULAR | Status: DC | PRN

## 2016-03-04 MED ORDER — SODIUM CHLORIDE 0.9 % IV SOLN
INTRAVENOUS | Status: DC | PRN
  Administered 2016-03-04: 60 mL

## 2016-03-04 MED ORDER — FENTANYL CITRATE (PF) 100 MCG/2ML IJ SOLN
INTRAMUSCULAR | Status: AC
  Filled 2016-03-04: qty 2

## 2016-03-04 MED ORDER — BUPIVACAINE-EPINEPHRINE (PF) 0.5% -1:200000 IJ SOLN
INTRAMUSCULAR | Status: AC
  Filled 2016-03-04: qty 30

## 2016-03-04 SURGICAL SUPPLY — 67 items
BAG HAMPER (MISCELLANEOUS) ×2 IMPLANT
BANDAGE ESMARK 6X9 LF (GAUZE/BANDAGES/DRESSINGS) ×1 IMPLANT
BIT DRILL 3.2X128 (BIT) IMPLANT
BLADE HEX COATED 2.75 (ELECTRODE) ×2 IMPLANT
BLADE SAGITTAL 25.0X1.27X90 (BLADE) ×2 IMPLANT
BNDG CMPR 9X6 STRL LF SNTH (GAUZE/BANDAGES/DRESSINGS) ×1
BNDG ESMARK 6X9 LF (GAUZE/BANDAGES/DRESSINGS) ×2
BOWL SMART MIX CTS (DISPOSABLE) ×1 IMPLANT
CAP KNEE TOTAL 3 SIGMA ×1 IMPLANT
CEMENT HV SMART SET (Cement) ×3 IMPLANT
CLOTH BEACON ORANGE TIMEOUT ST (SAFETY) ×2 IMPLANT
COOLER CRYO CUFF IC AND MOTOR (MISCELLANEOUS) ×2 IMPLANT
COVER LIGHT HANDLE STERIS (MISCELLANEOUS) ×4 IMPLANT
COVER PROBE W GEL 5X96 (DRAPES) ×1 IMPLANT
CUFF CRYO KNEE LG 20X31 COOLER (ORTHOPEDIC SUPPLIES) ×1 IMPLANT
CUFF CRYO KNEE18X23 MED (MISCELLANEOUS) ×2 IMPLANT
CUFF TOURNIQUET SINGLE 34IN LL (TOURNIQUET CUFF) ×1 IMPLANT
CUFF TOURNIQUET SINGLE 44IN (TOURNIQUET CUFF) IMPLANT
DECANTER SPIKE VIAL GLASS SM (MISCELLANEOUS) ×2 IMPLANT
DRAPE BACK TABLE (DRAPES) ×2 IMPLANT
DRAPE EXTREMITY T 121X128X90 (DRAPE) ×2 IMPLANT
DRESSING AQUACEL AG ADV 3.5X12 (MISCELLANEOUS) ×1 IMPLANT
DRSG AQUACEL AG ADV 3.5X10 (GAUZE/BANDAGES/DRESSINGS) ×1 IMPLANT
DRSG AQUACEL AG ADV 3.5X12 (MISCELLANEOUS)
DRSG MEPILEX BORDER 4X12 (GAUZE/BANDAGES/DRESSINGS) ×1 IMPLANT
DURAPREP 26ML APPLICATOR (WOUND CARE) ×4 IMPLANT
ELECT REM PT RETURN 9FT ADLT (ELECTROSURGICAL) ×2
ELECTRODE REM PT RTRN 9FT ADLT (ELECTROSURGICAL) ×1 IMPLANT
EVACUATOR 3/16  PVC DRAIN (DRAIN) ×1
EVACUATOR 3/16 PVC DRAIN (DRAIN) ×1 IMPLANT
GLOVE BIOGEL PI IND STRL 7.0 (GLOVE) ×1 IMPLANT
GLOVE BIOGEL PI INDICATOR 7.0 (GLOVE) ×1
GLOVE SKINSENSE NS SZ8.0 LF (GLOVE) ×1
GLOVE SKINSENSE STRL SZ8.0 LF (GLOVE) ×2 IMPLANT
GLOVE SS N UNI LF 8.5 STRL (GLOVE) ×2 IMPLANT
GOWN STRL REUS W/TWL LRG LVL3 (GOWN DISPOSABLE) ×4 IMPLANT
GOWN STRL REUS W/TWL XL LVL3 (GOWN DISPOSABLE) ×2 IMPLANT
HANDPIECE INTERPULSE COAX TIP (DISPOSABLE) ×2
HOOD W/PEELAWAY (MISCELLANEOUS) ×7 IMPLANT
INST SET MAJOR BONE (KITS) ×2 IMPLANT
IV NS IRRIG 3000ML ARTHROMATIC (IV SOLUTION) ×2 IMPLANT
KIT BLADEGUARD II DBL (SET/KITS/TRAYS/PACK) ×2 IMPLANT
KIT ROOM TURNOVER APOR (KITS) ×2 IMPLANT
MANIFOLD NEPTUNE II (INSTRUMENTS) ×2 IMPLANT
MARKER SKIN DUAL TIP RULER LAB (MISCELLANEOUS) ×2 IMPLANT
NDL HYPO 21X1.5 SAFETY (NEEDLE) ×1 IMPLANT
NEEDLE HYPO 21X1.5 SAFETY (NEEDLE) ×2 IMPLANT
NS IRRIG 1000ML POUR BTL (IV SOLUTION) ×2 IMPLANT
PACK TOTAL JOINT (CUSTOM PROCEDURE TRAY) ×2 IMPLANT
PAD ARMBOARD 7.5X6 YLW CONV (MISCELLANEOUS) ×2 IMPLANT
PAD DANNIFLEX CPM (ORTHOPEDIC SUPPLIES) ×2 IMPLANT
PIN TROCAR 3 INCH (PIN) IMPLANT
SAW OSC TIP CART 19.5X105X1.3 (SAW) ×2 IMPLANT
SET BASIN LINEN APH (SET/KITS/TRAYS/PACK) ×2 IMPLANT
SET HNDPC FAN SPRY TIP SCT (DISPOSABLE) ×1 IMPLANT
STAPLER VISISTAT 35W (STAPLE) ×3 IMPLANT
SUT BRALON NAB BRD #1 30IN (SUTURE) ×4 IMPLANT
SUT MON AB 0 CT1 (SUTURE) ×2 IMPLANT
SUT MON AB 2-0 CT1 36 (SUTURE) IMPLANT
SYR 20CC LL (SYRINGE) ×3 IMPLANT
SYR 30ML LL (SYRINGE) ×2 IMPLANT
SYR BULB IRRIGATION 50ML (SYRINGE) ×2 IMPLANT
TOWEL OR 17X26 4PK STRL BLUE (TOWEL DISPOSABLE) ×2 IMPLANT
TOWER CARTRIDGE SMART MIX (DISPOSABLE) IMPLANT
TRAY FOLEY CATH SILVER 16FR (SET/KITS/TRAYS/PACK) ×2 IMPLANT
WATER STERILE IRR 1000ML POUR (IV SOLUTION) ×4 IMPLANT
YANKAUER SUCT 12FT TUBE ARGYLE (SUCTIONS) ×2 IMPLANT

## 2016-03-04 NOTE — Progress Notes (Signed)
Resting quietly. Denies pain. Waiting on bed assignment.

## 2016-03-04 NOTE — Interval H&P Note (Signed)
History and Physical Interval Note:  03/04/2016 7:19 AM BP 123/83 mmHg  Pulse 95  Temp(Src) 98.1 F (36.7 C) (Oral)  Resp 18  SpO2 92% Skin surgical site is clean  Stephanie [name]  has presented today for surgery, with the diagnosis of left knee osteoarthritis  The various methods of treatment have been discussed with the patient and family. After consideration of risks, benefits and other options for treatment, the patient has consented to  Procedure(s): TOTAL KNEE ARTHROPLASTY (Left) as a surgical intervention .  The patient's history has been reviewed, patient examined, no change in status, stable for surgery.  I have reviewed the patient's chart and labs.  Questions were answered to the patient's satisfaction.     Stephanie [name]

## 2016-03-04 NOTE — Progress Notes (Signed)
AP/Lat xray left knee done.

## 2016-03-04 NOTE — Op Note (Signed)
03/04/2016  9:43 AM  PATIENT:  Stephanie [name]  65 y.o. female  PRE-OPERATIVE DIAGNOSIS:  left knee osteoarthritis  POST-OPERATIVE DIAGNOSIS:  left knee osteoarthritis  PROCEDURE:  Procedure(s): LEFT TOTAL KNEE ARTHROPLASTY (Left)  SURGEON:  Surgeon(s) and Role:    * Vickki Hearing, MD - Primary DEPUY SIGMA FIXED BEARING 16F 2.5T 38 X 8.5 PATELLA 10PS POLY PHYSICIAN ASSISTANT:   ASSISTANTS: betty ashley   ANESTHESIA:   spinal  EBL:  Total I/O In: 1200 [I.V.:1200] Out: 100 [Urine:100]  BLOOD ADMINISTERED:none  DRAINS: (1) Hemovact drain(s) in the left knee joint with  Suction Open   LOCAL MEDICATIONS USED:  MARCAINE   , Amount: 30 ml and OTHER exparel  SPECIMEN:  No Specimen  DISPOSITION OF SPECIMEN:  N/A  COUNTS:  YES  TOURNIQUET:   Total Tourniquet Time Documented: Thigh (Left) - 86 minutes Total: Thigh (Left) - 86 minutes   DICTATION: .Reubin Milan Dictation  PLAN OF CARE: Admit to inpatient   PATIENT DISPOSITION:  PACU - hemodynamically stable.   Delay start of Pharmacological VTE agent (>24hrs) due to surgical blood loss or risk of bleeding: yes    details of procedure:   The patient was identified in the preop holding area and the surgical site was confirmed as the left knee. Chart review and update were completed. The patient was taken to the operating room for spinal anesthesia. After successful spinal anesthesia Foley catheter was inserted. The patient was placed supine on the operating table.   the left leg was prepped with ChloraPrep and draped sterilely. Timeout was completed. The limb was then exsanguinated a  6 inch Esmarch. The tourniquet was elevated to 300 mmHg.   A midline incision was made and taken down to the extensor mechanism followed by medial arthrotomy. The patella was everted. A SYNOVECTOMY Was performed as needed. The osteophytes were resected.  Anterior cruciate ligament and PCL and medial and lateral meniscus were resected.   a 3/8 inch drill bit was used to enter the femoral canal which was suctioned and irrigated until the fluid was clear. The distal femoral cut was set for 11 millimeter resection with a 5   Left Valgus angle. This cut was completed and checked for flatness.   the tibia was subluxated forward and the external alignment guide was placed. We removed 8 mm of bone from the higher lateral side. We set the guide for neutral varus valgus cut related to the  Mechanical axis of the tibia and for slope matching the patient's anatomy. Rotational alignment was set using the tibial tubercle, tibial spine and second metatarsal. The cutting block was pinned and the proximal tibia was resected.   A spacer block was placed in the extension gap and it fit nicely with good collateral ligament stability   the femur was then measured to a size 2.  The cutting block was placed to match the epicondyles and the 4 distal cuts were made.   spacer blocks were placed starting with a 10 mm insert to confirm equal flexion-extension gaps. A size  10  mm insert balanced the gaps.   We placed the femoral notch cutting guide size 2  and resected the notch.   Trial implants WERE Placed using appropriate size femur , appropriate size tibial baseplate which was measured after the proximal tibia resection. Tibial rotation was set patella tracking was normal   The tibia was then punched per manufacture technique making sure to avoid internal rotation.   The patella  measured a size 21   We resected down to a size 13 using a size 38 button.   Final range of motion check was performed with the appropriate size trials as mentioned above. Satisfactory reduction and motion were obtained.   Trial implants were removed. The bone was irrigated and dried and the cement was mixed on the back table   EXPAREL 20 mL diluted with 40 mL of saline was injected in the soft tissues including posterior capsule followed by 30 mL of Marcaine with  epinephrine in the soft tissues   These implants were then cemented in place. Excess cement was removed. The cement was allowed to cure. Second irrigation was performed.    FInal range of motion check and stability check was completed. Patella tracked normally passive flexion 120 full extension was obtained.  The wound was irrigated A third time Hemovac drain was placed, extensor mechanism was closed with #1 Nurolon followed by 0 Monocryl and staples to reapproximate the skin edges and subcutaneous tissue.   Sterile dressing was applied  The patient was taken recovery in stable condition

## 2016-03-04 NOTE — Anesthesia Procedure Notes (Addendum)
Date/Time: 03/04/2016 7:30 AM Performed by: Pernell DupreADAMS, AMY A Pre-anesthesia Checklist: Patient identified, Timeout performed, Emergency Drugs available, Suction available and Patient being monitored Oxygen Delivery Method: Simple face mask   Spinal Patient location during procedure: OR Start time: 03/04/2016 7:44 AM Staffing Resident/CRNA: ADAMS, AMY A Preanesthetic Checklist Completed: patient identified, site marked, surgical consent, pre-op evaluation, timeout performed, IV checked, risks and benefits discussed and monitors and equipment checked Spinal Block Patient position: left lateral decubitus Prep: Betadine Patient monitoring: heart rate, cardiac monitor, continuous pulse ox and blood pressure Approach: left paramedian Location: L3-4 Injection technique: single-shot Needle Needle type: Spinocan  Needle gauge: 22 G Needle length: 9 cm Assessment Sensory level: T8 Additional Notes  ATTEMPTS:1 TRAY QM:[phone] TRAY EXPIRATION DATE:2017-01-14

## 2016-03-04 NOTE — Brief Op Note (Signed)
03/04/2016  9:43 AM  PATIENT:  Stephanie [name]  65 y.o. female  PRE-OPERATIVE DIAGNOSIS:  left knee osteoarthritis  POST-OPERATIVE DIAGNOSIS:  left knee osteoarthritis  PROCEDURE:  Procedure(s): LEFT TOTAL KNEE ARTHROPLASTY (Left)  SURGEON:  Surgeon(s) and Role:    * Vickki HearingStanley E Harrison, MD - Primary DEPUY SIGMA FIXED BEARING 39F 2.5T 38 X 8.5 PATELLA 10PS POLY PHYSICIAN ASSISTANT:   ASSISTANTS: betty ashley   ANESTHESIA:   spinal  EBL:  Total I/O In: 1200 [I.V.:1200] Out: 100 [Urine:100]  BLOOD ADMINISTERED:none  DRAINS: (1) Hemovact drain(s) in the left knee joint with  Suction Open   LOCAL MEDICATIONS USED:  MARCAINE   , Amount: 30 ml and OTHER exparel  SPECIMEN:  No Specimen  DISPOSITION OF SPECIMEN:  N/A  COUNTS:  YES  TOURNIQUET:   Total Tourniquet Time Documented: Thigh (Left) - 86 minutes Total: Thigh (Left) - 86 minutes   DICTATION: .Reubin Milanragon Dictation  PLAN OF CARE: Admit to inpatient   PATIENT DISPOSITION:  PACU - hemodynamically stable.   Delay start of Pharmacological VTE agent (>24hrs) due to surgical blood loss or risk of bleeding: yes

## 2016-03-04 NOTE — Transfer of Care (Signed)
Immediate Anesthesia Transfer of Care Note  Patient: Stephanie [name]  Procedure(s) Performed: Procedure(s): LEFT TOTAL KNEE ARTHROPLASTY (Left)  Patient Location: PACU  Anesthesia Type:Spinal  Level of Consciousness: awake, alert , oriented and patient cooperative  Airway & Oxygen Therapy: Patient Spontanous Breathing and Patient connected to nasal cannula oxygen  Post-op Assessment: Report given to RN and Post -op Vital signs reviewed and stable  Post vital signs: Reviewed and stable  Last Vitals:  Filed Vitals:   03/04/16 0715 03/04/16 0730  BP: 127/79   Pulse:    Temp:    Resp: 16 15    Last Pain: There were no vitals filed for this visit.    Patients Stated Pain Goal: 5 (03/04/16 [id number])  Complications: No apparent anesthesia complications

## 2016-03-04 NOTE — Anesthesia Postprocedure Evaluation (Signed)
Anesthesia Post Note  Patient: Stephanie [name]  Procedure(s) Performed: Procedure(s) (LRB): LEFT TOTAL KNEE ARTHROPLASTY (Left)  Patient location during evaluation: PACU Anesthesia Type: Spinal Level of consciousness: awake and alert and oriented Pain management: pain level controlled Vital Signs Assessment: post-procedure vital signs reviewed and stable Respiratory status: spontaneous breathing, respiratory function stable and patient connected to nasal cannula oxygen Cardiovascular status: stable Postop Assessment: no signs of nausea or vomiting and spinal receding Anesthetic complications: no    Last Vitals:  Filed Vitals:   03/04/16 0715 03/04/16 0730  BP: 127/79   Pulse:    Temp:    Resp: 16 15    Last Pain: There were no vitals filed for this visit.               Joy Reiger A

## 2016-03-04 NOTE — Anesthesia Preprocedure Evaluation (Signed)
Anesthesia Evaluation  Patient identified by MRN, date of birth, ID band Patient awake    Reviewed: Allergy & Precautions, NPO status , Patient's Chart, lab work & pertinent test results  History of Anesthesia Complications (+) PONV and history of anesthetic complications  Airway Mallampati: III  TM Distance: >3 FB     Dental  (+) Teeth Intact, Partial Lower, Partial Upper   Pulmonary neg pulmonary ROS,    breath sounds clear to auscultation       Cardiovascular hypertension, Pt. on medications  Rhythm:Regular Rate:Normal     Neuro/Psych    GI/Hepatic GERD  Medicated and Controlled,  Endo/Other  diabetes, Type 2, Oral Hypoglycemic Agents  Renal/GU      Musculoskeletal  (+) Arthritis , Osteoarthritis,    Abdominal   Peds  Hematology   Anesthesia Other Findings   Reproductive/Obstetrics                             Anesthesia Physical Anesthesia Plan  ASA: III  Anesthesia Plan: Spinal   Post-op Pain Management:    Induction:   Airway Management Planned: Simple Face Mask  Additional Equipment:   Intra-op Plan:   Post-operative Plan:   Informed Consent: I have reviewed the patients History and Physical, chart, labs and discussed the procedure including the risks, benefits and alternatives for the proposed anesthesia with the patient or authorized representative who has indicated his/her understanding and acceptance.     Plan Discussed with:   Anesthesia Plan Comments:         Anesthesia Quick Evaluation

## 2016-03-05 LAB — TYPE AND SCREEN
ABO/RH(D): B POS
ANTIBODY SCREEN: NEGATIVE
Unit division: 0
Unit division: 0

## 2016-03-05 LAB — BASIC METABOLIC PANEL
ANION GAP: 8 (ref 5–15)
BUN: 13 mg/dL (ref 6–20)
CALCIUM: 8.9 mg/dL (ref 8.9–10.3)
CO2: 25 mmol/L (ref 22–32)
CREATININE: 0.81 mg/dL (ref 0.44–1.00)
Chloride: 103 mmol/L (ref 101–111)
GFR calc Af Amer: >60 mL/min (ref >60)
GLUCOSE: 116 mg/dL — AB (ref 65–99)
Potassium: 4.3 mmol/L (ref 3.5–5.1)
Sodium: 136 mmol/L (ref 135–145)

## 2016-03-05 LAB — CBC
HCT: 31.5 % — ABNORMAL LOW (ref 36.0–46.0)
Hemoglobin: 10.3 g/dL — ABNORMAL LOW (ref 12.0–15.0)
MCH: 28.1 pg (ref 26.0–34.0)
MCHC: 32.7 g/dL (ref 30.0–36.0)
MCV: 86.1 fL (ref 78.0–100.0)
PLATELETS: 272 10*3/uL (ref 150–400)
RBC: 3.66 MIL/uL — ABNORMAL LOW (ref 3.87–5.11)
RDW: 14.5 % (ref 11.5–15.5)
WBC: 11.3 10*3/uL — AB (ref 4.0–10.5)

## 2016-03-05 NOTE — Addendum Note (Signed)
Addendum  created 03/05/16 [id number] by Earleen NewportAmy A Adams, CRNA   Modules edited: Clinical Notes   Clinical Notes:  File: [id number]

## 2016-03-05 NOTE — Anesthesia Postprocedure Evaluation (Signed)
Anesthesia Post Note  Patient: Stephanie [name]  Procedure(s) Performed: Procedure(s) (LRB): LEFT TOTAL KNEE ARTHROPLASTY (Left)  Patient location during evaluation: Nursing Unit Anesthesia Type: Spinal Level of consciousness: awake and alert and oriented Pain management: pain level controlled Vital Signs Assessment: post-procedure vital signs reviewed and stable Respiratory status: spontaneous breathing and patient connected to nasal cannula oxygen Cardiovascular status: stable Postop Assessment: no signs of nausea or vomiting Anesthetic complications: no    Last Vitals:  Filed Vitals:   03/04/16 2044 03/05/16 0612  BP: 134/70 133/79  Pulse: 103 101  Temp: 36.4 C 37.1 C  Resp: 18 18    Last Pain:  Filed Vitals:   03/05/16 0658  PainSc: 3                  ADAMS, AMY A

## 2016-03-05 NOTE — Care Management Note (Signed)
Case Management Note  Patient Details  Name: Marcelyn BruinsGwendolyn M Soter MRN: [id number] Date of Birth: February 01, 1951  Subjective/Objective:    Spoke with patient and husband at the bedside. Offered choice of HH providers and patient chose to go with [facility]. Patient will need BSC/3 n1 chair, Placed DME order with [facility].   CPM to be ordered from Dr Diamantina ProvidenceHarrisons office.  Patient home on one level and has assistance of husband. [facility] [place], PCP is Dr Ouida SillsFagan.             Action/Plan: [facility]    Expected Discharge Date:                  Expected Discharge Plan:  Home w Home Health Services  In-House Referral:     Discharge planning Services  CM Consult  Post Acute Care Choice:    Choice offered to:  Patient  DME Arranged:  Dan HumphreysWalker, Bedside commode DME Agency:  [facility]  HH Arranged:  [facility]  Status of Service:     If discussed at Long Length of Stay Meetings, dates discussed:    Additional Comments:  Adonis HugueninBerkhead, Ariaunna Longsworth L, RN 03/05/2016, 3:05 PM

## 2016-03-05 NOTE — Evaluation (Signed)
Occupational Therapy Evaluation Patient Details Name: Stephanie [name] MRN: [id number] DOB: September 26, 1950 Today's Date: 03/05/2016    History of Present Illness 65 year old female followed long time for Left knee pain. She's had adequate nonoperative treatment including oral pain medication with anti-inflammatories and opioid management. She's had injections in the knees as well. Left TKA on 03/04/16.    Clinical Impression   Pt awake, alert, oriented x4 this am, agreeable to OT evaluation. Pt reports independence prior to surgery, will have 24/7 support on discharge home. Pt able to complete ADL tasks in sitting with set-up, min-mod assist for LB dressing due to pain. Pt experiencing nausea upon standing and transferring bed<>chair. Min assist for sit<>stand and min guard for stand pivot transfer with walker. No further OT services required at this time as pt has adequate support system in place for assistance on discharge home. .     Follow Up Recommendations  No OT follow up    Equipment Recommendations  None recommended by OT       Precautions / Restrictions Precautions Precautions: None Restrictions Weight Bearing Restrictions: Yes LLE Weight Bearing: Weight bearing as tolerated      Mobility Bed Mobility Overal bed mobility: Needs Assistance Bed Mobility: Supine to Sit     Supine to sit: Supervision        Transfers Overall transfer level: Needs assistance Equipment used: Rolling walker (2 wheeled) Transfers: Sit to/from UGI CorporationStand;Stand Pivot Transfers Sit to Stand: Min assist Stand pivot transfers: Min guard       General transfer comment: Pt required verbal cuing for pushing up from bed to come to standing position.          ADL Overall ADL's : Needs assistance/impaired                                       General ADL Comments: Pt able to complete ADL tasks with set-up to min assist in sitting. Assistance required for reaching below  waist level (LB dressing) due to pain     Vision Vision Assessment?: No apparent visual deficits          Pertinent Vitals/Pain Pain Assessment: 0-10 Pain Score: 6  Pain Location: left knee Pain Descriptors / Indicators: Aching Pain Intervention(s): Limited activity within patient's tolerance;Monitored during session;Premedicated before session     Hand Dominance Right   Extremity/Trunk Assessment Upper Extremity Assessment Upper Extremity Assessment: Overall WFL for tasks assessed   Lower Extremity Assessment Lower Extremity Assessment: Defer to PT evaluation       Communication Communication Communication: No difficulties   Cognition Arousal/Alertness: Awake/alert Behavior During Therapy: WFL for tasks assessed/performed Overall Cognitive Status: Within Functional Limits for tasks assessed                                Home Living Family/patient expects to be discharged to:: Private residence Living Arrangements: Spouse/significant other Available Help at Discharge: Family;Available 24 hours/day               Bathroom Shower/Tub: Chief Strategy OfficerTub/shower unit   Bathroom Toilet: Standard     Home Equipment: None          Prior Functioning/Environment Level of Independence: Independent             OT Diagnosis: Acute pain   OT Problem List: Pain    End  of Session Equipment Utilized During Treatment: Gait belt;Rolling walker  Activity Tolerance: Patient tolerated treatment well Patient left: in chair;with call bell/phone within reach;with family/visitor present   Time: 4782-9562 OT Time Calculation (min): 23 min Charges:  OT General Charges $OT Visit: 1 Procedure OT Evaluation $OT Eval Low Complexity: 1 Procedure  Ezra Sites, OTR/L  [phone]  03/05/2016, 9:06 AM

## 2016-03-05 NOTE — Clinical Social Work Note (Signed)
CSW met with patient and spouse. Patient advised that she had made the decision to utilize [facility]. She did not desire to go to SNF for rehab.   CSW signing off.

## 2016-03-05 NOTE — Evaluation (Signed)
Physical Therapy Evaluation Patient Details Name: Stephanie [name] MRN: [id number] DOB: Jan 03, 1951 Today's Date: 03/05/2016   History of Present Illness  65 yo F admitted 03/04/2016 for elective L TKA. PMH: GERD, and DM  Clinical Impression  Pt received in the bed, and was agreeable to PT evaluation, husband present.  Prior to admission, pt was independent for all aspects of functional mobility.  During PT evaluation today, she was Mod (I) for bed mobility, Min guard for sit<>stand transfer, and Min guard for gait x 12ft with RW.  Pt still needs to practice going up/down 1 step.  She is recommended for HHPT, RW, and BSC upon d/c.     Follow Up Recommendations Home health PT    Equipment Recommendations  Rolling walker with 5" wheels;3in1 (PT)    Recommendations for Other Services       Precautions / Restrictions Precautions Precautions: None Restrictions Weight Bearing Restrictions: Yes LLE Weight Bearing: Weight bearing as tolerated      Mobility  Bed Mobility Overal bed mobility: Modified Independent Bed Mobility: Supine to Sit     Supine to sit: Supervision     General bed mobility comments: Increased time  Transfers Overall transfer level: Needs assistance Equipment used: Rolling walker (2 wheeled) Transfers: Sit to/from Stand Sit to Stand: Min guard Stand pivot transfers: Min guard       General transfer comment: vc's for hand placement  Ambulation/Gait Ambulation/Gait assistance: Min guard Ambulation Distance (Feet): 120 Feet Assistive device: Rolling walker (2 wheeled)       General Gait Details: decreased L knee flexion for limb advancement.  Progressing step through pattern.  Vc's to reduce WB on UE's and relax shoulders.   Stairs            Wheelchair Mobility    Modified Rankin (Stroke Patients Only)       Balance Overall balance assessment: Needs assistance         Standing balance support: Bilateral upper extremity  supported Standing balance-Leahy Scale: Fair                               Pertinent Vitals/Pain Pain Assessment: 0-10 Pain Score: 6  Pain Location: L knee Pain Descriptors / Indicators: Aching Pain Intervention(s): Limited activity within patient's tolerance    Home Living Family/patient expects to be discharged to:: Private residence Living Arrangements: Spouse/significant other Available Help at Discharge:  (sisters are going to come help with whatever she needs. ) Type of Home: House Home Access: Level entry     Home Layout: Two level;Able to live on main level with bedroom/bathroom Home Equipment: None      Prior Function Level of Independence: Independent               Hand Dominance   Dominant Hand: Right    Extremity/Trunk Assessment   Upper Extremity Assessment: Overall WFL for tasks assessed           Lower Extremity Assessment: LLE deficits/detail   LLE Deficits / Details: Grossly 3/5     Communication   Communication: No difficulties  Cognition Arousal/Alertness: Awake/alert Behavior During Therapy: WFL for tasks assessed/performed Overall Cognitive Status: Within Functional Limits for tasks assessed                      General Comments      Exercises Total Joint Exercises Ankle Circles/Pumps: AROM;Both;10 reps;Supine Quad Sets: Strengthening;Both;10  reps;Supine;Other (comment) (5 second hold) Gluteal Sets: Strengthening;Both;10 reps;Supine;Other (comment) (5 second hold) Short Arc Quad: Strengthening;Left;10 reps;Supine Heel Slides: AROM;Left;10 reps;Supine Straight Leg Raises: Strengthening;Left;10 reps;Supine      Assessment/Plan    PT Assessment Patient needs continued PT services  PT Diagnosis Difficulty walking;Abnormality of gait;Generalized weakness;Acute pain   PT Problem List Decreased strength;Decreased range of motion;Decreased activity tolerance;Decreased balance;Decreased mobility;Decreased  knowledge of use of DME;Decreased safety awareness;Decreased knowledge of precautions;Pain  PT Treatment Interventions DME instruction;Gait training;Stair training;Functional mobility training;Therapeutic activities;Therapeutic exercise;Balance training;Patient/family education   PT Goals (Current goals can be found in the Care Plan section) Acute Rehab PT Goals Patient Stated Goal: To be able to walk better again PT Goal Formulation: With patient/family Time For Goal Achievement: 03/12/16 Potential to Achieve Goals: Good    Frequency BID   Barriers to discharge        Co-evaluation               End of Session Equipment Utilized During Treatment: Gait belt Activity Tolerance: Patient tolerated treatment well Patient left: in bed;in CPM           Time: 1045-1130 PT Time Calculation (min) (ACUTE ONLY): 45 min   Charges:   PT Evaluation $PT Eval Low Complexity: 1 Procedure PT Treatments $Gait Training: 8-22 mins $Therapeutic Exercise: 8-22 mins   PT G Codes:        Beth Lisl Slingerland, PT, DPT X: [phone]

## 2016-03-05 NOTE — Progress Notes (Signed)
Subjective: 1 Day Post-Op Procedure(s) (LRB): LEFT TOTAL KNEE ARTHROPLASTY (Left) Patient reports pain as mild.    Objective: Vital signs in last 24 hours: Temp:  [97.6 F (36.4 C)-98.7 F (37.1 C)] 98.7 F (37.1 C) (07/20 0612) Pulse Rate:  [86-103] 101 (07/20 0612) Resp:  [10-20] 18 (07/20 0612) BP: (95-134)/(59-79) 133/79 mmHg (07/20 0612) SpO2:  [92 %-99 %] 98 % (07/20 0612)  Intake/Output from previous day: 07/19 0701 - 07/20 0700 In: 2200 [I.V.:1900] Out: 1795 [Urine:1650; Drains:135; Blood:10] Intake/Output this shift:     Recent Labs  03/05/16 0536  HGB 10.3*    Recent Labs  03/05/16 0536  WBC 11.3*  RBC 3.66*  HCT 31.5*  PLT 272    Recent Labs  03/05/16 0536  NA 136  K 4.3  CL 103  CO2 25  BUN 13  CREATININE 0.81  GLUCOSE 116*  CALCIUM 8.9   No results for input(s): LABPT, INR in the last 72 hours.  Neurologically intact Sensation intact distally Intact pulses distally Dorsiflexion/Plantar flexion intact Incision: dressing C/D/I Compartment soft negative homans soft calf  Assessment/Plan: 1 Day Post-Op Procedure(s) (LRB): LEFT TOTAL KNEE ARTHROPLASTY (Left) Advance diet Up with therapy D/C IV fluids  Fuller CanadaStanley Harrison 03/05/2016, 7:13 AM

## 2016-03-05 NOTE — Progress Notes (Signed)
Physical Therapy Treatment Patient Details Name: Stephanie [name] MRN: [id number] DOB: [birth year] Today's Date: 03/05/2016    History of Present Illness 65 yo F admitted 03/04/2016 for elective L TKA. PMH: GERD, and DM    PT Comments    Pt received in bed, husband present, and pt is agreeable to PT tx.  Pt demonstrated all functional mobility at Mod (I) including bed mobility, sit<>stand transfer, ambulation x 134f with RW.  She also negotiated 1 step with supervision, and hands on education of husband.  Pt has met all functional PT goals at this point and she is cleared to d/c home with HHPT to continue progression of strength, AROM and gait quality.    Follow Up Recommendations  Home health PT     Equipment Recommendations  Rolling walker with 5" wheels;3in1 (PT)    Recommendations for Other Services       Precautions / Restrictions Precautions Precautions: None Restrictions Weight Bearing Restrictions: Yes LLE Weight Bearing: Weight bearing as tolerated    Mobility  Bed Mobility Overal bed mobility: Modified Independent             General bed mobility comments: Increased time  Transfers Overall transfer level: Modified independent Equipment used: Rolling walker (2 wheeled) Transfers: Sit to/from Stand Sit to Stand: Min guard         General transfer comment: vc's for hand placement  Ambulation/Gait Ambulation/Gait assistance: Modified independent (Device/Increase time) Ambulation Distance (Feet): 100 Feet Assistive device: Rolling walker (2 wheeled) Gait Pattern/deviations: Step-through pattern     General Gait Details: Progressed to continuous step through pattern.    Stairs Stairs: Yes Stairs assistance: Supervision Stair Management: Two rails Number of Stairs: 1 General stair comments: Husband present for hands on stair training   Wheelchair Mobility    Modified Rankin (Stroke Patients Only)       Balance Overall balance  assessment: Needs assistance         Standing balance support: Bilateral upper extremity supported Standing balance-Leahy Scale: Good                      Cognition Arousal/Alertness: Awake/alert Behavior During Therapy: WFL for tasks assessed/performed Overall Cognitive Status: Within Functional Limits for tasks assessed                      Exercises Total Joint Exercises Ankle Circles/Pumps: AROM;Both;10 reps;Supine Quad Sets: Strengthening;Both;10 reps;Supine;Other (comment) (5 second hold) Gluteal Sets: Strengthening;Both;10 reps;Supine;Other (comment) (5 second hold) Short Arc Quad: Strengthening;Left;10 reps;Supine Heel Slides: AROM;Left;10 reps;Supine Straight Leg Raises: Strengthening;Left;10 reps;Supine    General Comments        Pertinent Vitals/Pain Pain Score: 5  Pain Location: L knee Pain Descriptors / Indicators: Aching Pain Intervention(s): Limited activity within patient's tolerance;Repositioned    Home Living                      Prior Function            PT Goals (current goals can now be found in the care plan section) Acute Rehab PT Goals Patient Stated Goal: To be able to walk better again PT Goal Formulation: With patient/family Time For Goal Achievement: 03/12/16 Potential to Achieve Goals: Good Progress towards PT goals: Progressing toward goals    Frequency  BID    PT Plan Current plan remains appropriate    Co-evaluation             End  of Session Equipment Utilized During Treatment: Gait belt Activity Tolerance: Patient tolerated treatment well Patient left: in chair;with call bell/phone within reach;with family/visitor present     Time: 1407-1430 PT Time Calculation (min) (ACUTE ONLY): 23 min  Charges:  $Gait Training: 23-37 mins $Therapeutic Exercise: 8-22 mins                    G Codes:      Beth Andreah Goheen, PT, DPT X: [phone]

## 2016-03-06 LAB — CBC
HCT: 34.1 % — ABNORMAL LOW (ref 36.0–46.0)
HEMOGLOBIN: 11.1 g/dL — AB (ref 12.0–15.0)
MCH: 28 pg (ref 26.0–34.0)
MCHC: 32.6 g/dL (ref 30.0–36.0)
MCV: 85.9 fL (ref 78.0–100.0)
PLATELETS: 262 10*3/uL (ref 150–400)
RBC: 3.97 MIL/uL (ref 3.87–5.11)
RDW: 14.5 % (ref 11.5–15.5)
WBC: 12.3 10*3/uL — AB (ref 4.0–10.5)

## 2016-03-06 MED ORDER — HYDROCODONE-ACETAMINOPHEN 10-325 MG PO TABS: 1.0000 | ORAL_TABLET | ORAL | Status: DC

## 2016-03-06 MED ORDER — ASPIRIN 325 MG PO TBEC: 325.0000 mg | DELAYED_RELEASE_TABLET | Freq: Every day | ORAL | Status: DC

## 2016-03-06 NOTE — Care Management Important Message (Signed)
Important Message  Patient Details  Name: Marcelyn BruinsGwendolyn M Dunnaway MRN: [id number] Date of Birth: [birth year]   Medicare Important Message Given:  N/A - LOS <3 / Initial given by admissions    Adonis HugueninBerkhead, Maevis Mumby L, RN 03/06/2016, 8:11 AM

## 2016-03-06 NOTE — Discharge Summary (Signed)
Physician Discharge Summary  Patient ID: ELSE HABERMANN MRN: [id number] DOB/AGE: 12/30/1950 65 y.o.  Admit date: 03/04/2016 Discharge date: 03/06/2016  Admission Diagnoses: Osteoarthritis left knee  Discharge Diagnoses: Same Active Problems:   Osteoarthritis of left knee   Discharged Condition: stable  Hospital Course:  July 19 hospital day one patient underwent a computed left total knee arthroplasty spinal anesthesia with a [employer] posterior stabilized implant July 20 hospital day 2 patient ambulated 100 feet, pain was well-controlled vital signs are stable July 21 hospital day 3 patient stable and ready for discharge  Consults: None  BMP Latest Ref Rng 03/05/2016 02/28/2016  Glucose 65 - 99 mg/dL 409(W) 119(J)  BUN 6 - 20 mg/dL 13 18  Creatinine 4.78 - 1.00 mg/dL 2.95 6.21  Sodium 308 - 145 mmol/L 136 139  Potassium 3.5 - 5.1 mmol/L 4.3 4.4  Chloride 101 - 111 mmol/L 103 106  CO2 22 - 32 mmol/L 25 23  Calcium 8.9 - 10.3 mg/dL 8.9 9.7    CBC Latest Ref Rng 03/06/2016 03/05/2016 02/28/2016  WBC 4.0 - 10.5 K/uL 12.3(H) 11.3(H) 6.2  Hemoglobin 12.0 - 15.0 g/dL 11.1(L) 10.3(L) 11.4(L)  Hematocrit 36.0 - 46.0 % 34.1(L) 31.5(L) 35.6(L)  Platelets 150 - 400 K/uL 262 272 290    Discharge Exam: Blood pressure 161/93, pulse 111, temperature 98.2 F (36.8 C), temperature source Oral, resp. rate 18, height  (1.575 m), SpO2 92 %. General appearance: alert, cooperative and appears stated age  Calf supple normal homans sign Drain removed    Disposition: 01-Home or Self Care  Discharge Instructions    CPM    Complete by:  As directed   Continuous passive motion machine (CPM):      Use the CPM from 0 to 70 for 8 hours per day.      You may increase by 10 per day.  You may break it up into 2 or 3 sessions per day.      Use CPM for 2 weeks or until you are told to stop.     Call MD / Call 911    Complete by:  As directed   If you experience chest pain or shortness  of breath, CALL 911 and be transported to the hospital emergency room.  If you develope a fever above 101 F, pus (white drainage) or increased drainage or redness at the wound, or calf pain, call your surgeon's office.     Change dressing    Complete by:  As directed   Change dressing on monday, then change the dressing daily with sterile 4 x 4 inch gauze dressing and apply TED hose.  You may clean the incision with alcohol prior to redressing.     Constipation Prevention    Complete by:  As directed   Drink plenty of fluids.  Prune juice may be helpful.  You may use a stool softener, such as Colace (over the counter) 100 mg twice a day.  Use MiraLax (over the counter) for constipation as needed.     Diet - low sodium heart healthy    Complete by:  As directed      Do not put a pillow under the knee. Place it under the heel.    Complete by:  As directed      Driving restrictions    Complete by:  As directed   No driving for 2 weeks     For Home Use Only DME CPM    Complete  by:  As directed   Laterality:  Left Knee  Length of Need:  2 weeks  Starting Flexion:  0  Ending Flexion:  70  Increase by Daily:  10  Surgery Date:  03/04/2016     Increase activity slowly as tolerated    Complete by:  As directed      TED hose    Complete by:  As directed   Use stockings (TED hose) for 2 weeks on both leg(s).  You may remove them at night for sleeping.            Medication List    STOP taking these medications        aspirin 81 MG tablet  Replaced by:  aspirin 325 MG EC tablet      TAKE these medications        aspirin 325 MG EC tablet  Take 1 tablet (325 mg total) by mouth daily with breakfast.     glipiZIDE 5 MG 24 hr tablet  Commonly known as:  GLUCOTROL XL  Take 5 mg by mouth daily with breakfast.     HYDROcodone-acetaminophen 10-325 MG tablet  Commonly known as:  NORCO  Take 1 tablet by mouth every 4 (four) hours.     INVOKANA 300 MG Tabs tablet  Generic drug:   canagliflozin  Take 300 mg by mouth daily before breakfast.     levothyroxine 75 MCG tablet  Commonly known as:  SYNTHROID, LEVOTHROID  Take 75 mcg by mouth daily before breakfast.     losartan 25 MG tablet  Commonly known as:  COZAAR  Take 25 mg by mouth daily.     metFORMIN 1000 MG tablet  Commonly known as:  GLUCOPHAGE  Take 1,000 mg by mouth 2 (two) times daily with a meal.     pravastatin 40 MG tablet  Commonly known as:  PRAVACHOL  Take 40 mg by mouth daily.     ranitidine 150 MG tablet  Commonly known as:  ZANTAC  Take 150 mg by mouth daily as needed for heartburn. Only occasionally           Follow-up Information    Follow up with Fuller CanadaStanley Ivor Kishi, MD.   Specialties:  Orthopedic Surgery, Radiology   Why:  For wound re-check   Contact information:   [address] [place] [address] [phone]       Signed: Fuller CanadaStanley Jule Whitsel 03/06/2016, 7:29 AM

## 2016-03-06 NOTE — Progress Notes (Signed)
Stephanie BruinsGwendolyn M [name] discharged Home per MD order.  Discharge instructions reviewed and discussed with the patient, all questions and concerns answered. Copy of instructions and scripts given to patient.    Medication List    STOP taking these medications        aspirin 81 MG tablet  Replaced by:  aspirin 325 MG EC tablet      TAKE these medications        aspirin 325 MG EC tablet  Take 1 tablet (325 mg total) by mouth daily with breakfast.     glipiZIDE 5 MG 24 hr tablet  Commonly known as:  GLUCOTROL XL  Take 5 mg by mouth daily with breakfast.     HYDROcodone-acetaminophen 10-325 MG tablet  Commonly known as:  NORCO  Take 1 tablet by mouth every 4 (four) hours.     INVOKANA 300 MG Tabs tablet  Generic drug:  canagliflozin  Take 300 mg by mouth daily before breakfast.     levothyroxine 75 MCG tablet  Commonly known as:  SYNTHROID, LEVOTHROID  Take 75 mcg by mouth daily before breakfast.     losartan 25 MG tablet  Commonly known as:  COZAAR  Take 25 mg by mouth daily.     metFORMIN 1000 MG tablet  Commonly known as:  GLUCOPHAGE  Take 1,000 mg by mouth 2 (two) times daily with a meal.     pravastatin 40 MG tablet  Commonly known as:  PRAVACHOL  Take 40 mg by mouth daily.     ranitidine 150 MG tablet  Commonly known as:  ZANTAC  Take 150 mg by mouth daily as needed for heartburn. Only occasionally         IV site discontinued and catheter remains intact. Site without signs and symptoms of complications. Dressing and pressure applied.  Patient escorted to car in a wheelchair,  no distress noted upon discharge.  Stephanie KoyanagiBonnie M Deddrick [name] 03/06/2016 11:26 AM

## 2016-03-06 NOTE — Care Management Note (Signed)
Case Management Note  Patient Details  Name: Marcelyn BruinsGwendolyn M Grenfell MRN: [id number] Date of Birth: September 25, 1950  Subjective/Objective:   Contacted [employer] and  CPM machine to be delivered to home this date.  [facility] to deliver Walker and 3 N 1 chair to room this morning prior to discharge. Tim Justice ([facility] notified of patient pending discharge this morning.                 Action/Plan:  [facility] needs, Will sign off.   Expected Discharge Date:                  Expected Discharge Plan:  Home w Home Health Services  In-House Referral:     Discharge planning Services  CM Consult  Post Acute Care Choice:    Choice offered to:  Patient  DME Arranged:  Dan HumphreysWalker, Bedside commode DME Agency:  [facility]  HH Arranged:  PT [facility]  Status of Service:  Completed, signed off  If discussed at Long Length of Stay Meetings, dates discussed:    Additional Comments:  Adonis HugueninBerkhead, Enisa Runyan L, RN 03/06/2016, 8:46 AM

## 2016-03-11 ENCOUNTER — Encounter (HOSPITAL_COMMUNITY): Payer: Self-pay | Admitting: Orthopedic Surgery

## 2016-03-16 ENCOUNTER — Ambulatory Visit (INDEPENDENT_AMBULATORY_CARE_PROVIDER_SITE_OTHER): Payer: BLUE CROSS/BLUE SHIELD | Admitting: Orthopedic Surgery

## 2016-03-16 ENCOUNTER — Encounter: Payer: Self-pay | Admitting: Orthopedic Surgery

## 2016-03-16 VITALS — BP 105/62 | Ht 62.0 in | Wt 187.0 lb

## 2016-03-16 DIAGNOSIS — Z4789 Encounter for other orthopedic aftercare: Secondary | ICD-10-CM

## 2016-03-16 DIAGNOSIS — Z96652 Presence of left artificial knee joint: Secondary | ICD-10-CM

## 2016-03-16 MED ORDER — PROMETHAZINE HCL 12.5 MG PO TABS: 12.5000 mg | ORAL_TABLET | Freq: Four times a day (QID) | ORAL | 2 refills | Status: DC | PRN

## 2016-03-16 MED ORDER — HYDROCODONE-ACETAMINOPHEN 7.5-325 MG PO TABS: 1.0000 | ORAL_TABLET | Freq: Four times a day (QID) | ORAL | 0 refills | Status: DC | PRN

## 2016-03-16 NOTE — Progress Notes (Signed)
FOLLOW UP FOR  left TKA  Chief Complaint  Patient presents with  . Follow-up    POST OP 1 LEFT TKA, DOS 03/04/16    The patient is doing very well. The pain is controlled with norco 10mg /some nausea  The surgical site is clean dry and intact  We were able to remove the staples today The calf is supple nontender the Homans sign is normal  DVT prevention aspirin 325 mg twice a day with bilateral TED hose  CPM machine at home  Home physical therapy   Assessment and plan: Stable, continue physical therapy return 2-4 weeks Start physical therapy as outpatient when home therapy completed

## 2016-03-16 NOTE — Addendum Note (Signed)
Addended by: Adella Hare B on: 03/16/2016 10:07 AM   Modules accepted: Orders

## 2016-03-16 NOTE — Patient Instructions (Signed)
Ice as needed   Start therapy aug 7th call today for appt   Take phenergan 30 min before your pain pill

## 2016-03-23 ENCOUNTER — Encounter (HOSPITAL_COMMUNITY): Payer: Self-pay | Admitting: Physical Therapy

## 2016-03-23 ENCOUNTER — Ambulatory Visit (HOSPITAL_COMMUNITY): Payer: BLUE CROSS/BLUE SHIELD | Attending: Orthopedic Surgery | Admitting: Physical Therapy

## 2016-03-23 DIAGNOSIS — R2689 Other abnormalities of gait and mobility: Secondary | ICD-10-CM | POA: Diagnosis present

## 2016-03-23 DIAGNOSIS — R2681 Unsteadiness on feet: Secondary | ICD-10-CM | POA: Diagnosis present

## 2016-03-23 DIAGNOSIS — R600 Localized edema: Secondary | ICD-10-CM | POA: Diagnosis present

## 2016-03-23 DIAGNOSIS — M25662 Stiffness of left knee, not elsewhere classified: Secondary | ICD-10-CM

## 2016-03-23 DIAGNOSIS — M25562 Pain in left knee: Secondary | ICD-10-CM | POA: Diagnosis not present

## 2016-03-23 NOTE — Patient Instructions (Signed)
BRIDGING  While lying on your back, tighten your lower abdominals, squeeze your buttocks and then raise your buttocks off the floor/bed as creating a "Bridge" with your body. Hold and then lower yourself and repeat.  2x15 reps.     SIT TO STAND - NO SUPPORT  Start by scooting close to the front of the chair.  Next, lean forward at your trunk and reach forward with your arms and rise to standing without using your hands to push off from the chair or other object.   Use your arms as a counter-balance by reaching forward when in sitting and lower them as you approach standing.    make sure to tuck your bottom and straighten knee at the top.  2x15 reps    Clamshells  Lay on side with hips in neutral position and knees slightly bent.  Raise knee towards the ceiling pivoting at the hip while firing the gluteus muscles.  Keep the feet together (one on top of the other) throughout repetition.  To increase resistance add a cuff weight around the lateral thigh or apply elastic band slightly above both knees.   With red band around knees.  2x10 each.    KNEE FLEXION STRETCH - SELF ASSISTED  While seated in a chair, use your unaffected leg to bend your affected knee until a stretch is felt.  hold 10 sec, repeat 10 times.  Do this twice a day.   KNEE EXTENSION STRETCH - PROPPED AND WEIGHTED  While seated, prop your foot up on another chair and allow gravity to stretch your knee towards a more straightened position. Place a weight such as a back pack, ankle weight or other item on the knee for an increased stretch.      hold for 30 minutes (during TV show)

## 2016-03-23 NOTE — Therapy (Signed)
[place] [facility] [address] [place], [place], [address] Phone: [phone]   Fax:  [phone]  Physical Therapy Evaluation  Patient Details  Name: Stephanie [name] MRN: [id number] Date of Birth: Sep 26, 1950 Referring Provider: Fuller Canada, MD  Encounter Date: 03/23/2016      PT End of Session - 03/23/16 1111    Visit Number 1   Number of Visits 12   Date for PT Re-Evaluation 04/13/16   Authorization Type BCBS   Authorization Time Period 03/23/16 to 05/08/16   PT Start Time 0950   PT Stop Time 1030   PT Time Calculation (min) 40 min   Activity Tolerance Patient tolerated treatment well   Behavior During Therapy [facility] for tasks assessed/performed      Past Medical History:  Diagnosis Date  . Diabetes mellitus without complication (HCC)   . GERD (gastroesophageal reflux disease)   . Hypercholesteremia   . PONV (postoperative nausea and vomiting)     Past Surgical History:  Procedure Laterality Date  . ABDOMINAL HYSTERECTOMY    . COLONOSCOPY N/A 12/26/2012   Procedure: COLONOSCOPY;  Surgeon: Corbin Ade, MD;  [facility];  Service: Endoscopy;  Laterality: N/A;  9:15 AM  . TOTAL KNEE ARTHROPLASTY Left 03/04/2016   Procedure: LEFT TOTAL KNEE ARTHROPLASTY;  Surgeon: Vickki Hearing, MD;  Location: [facility] ORS;  Service: Orthopedics;  Laterality: Left;  . TUBAL LIGATION      There were no vitals filed for this visit.       Subjective Assessment - 03/23/16 0954    Subjective Pt reports Lt knee pain for over 1 year before she elected to have a Lt TKR on 03/04/16. She reports no complications post op, and she received HHPT for 2 weeks. She is currently using [facility] and is afraid she might twist it the wrong way. She is taking her pain medication regularly. No other issues/concerns.   Pertinent History GERD, DM   Limitations Walking   How long can you sit comfortably? 20-30 minutes   How long can you stand comfortably?  unsure   How long can you walk comfortably? maybe 20 minutes    Patient Stated Goals improve ROM and strength    Currently in Pain? No/denies  Worst: 7/10, Best: 0/10            [facility] PT Assessment - 03/23/16 0001      Assessment   Medical Diagnosis Lt TKR   Referring Provider Fuller Canada, MD   Onset Date/Surgical Date 03/04/16   Next MD Visit 03/30/16   Prior Therapy 2 weeks HHPT, 3x/week     Precautions   Precautions None     Restrictions   Weight Bearing Restrictions No     Balance Screen   Has the patient fallen in the past 6 months No   Has the patient had a decrease in activity level because of a fear of falling?  No   Is the patient reluctant to leave their home because of a fear of falling?  No     Home Environment   Living Environment Private residence   Living Arrangements Spouse/significant other   Type of Home House   Additional Comments no STE     Prior Function   Level of Independence Independent     Cognition   Overall Cognitive Status Within Functional Limits for tasks assessed     Observation/Other Assessments   Observations pt wearing compression garment; majority of steri strips  still intact   Focus on Therapeutic Outcomes (FOTO)  56% limited      Sensation   Light Touch Appears Intact  reports diminished sensation lateral knee     ROM / Strength   AROM / PROM / Strength AROM;Strength     AROM   AROM Assessment Site Knee   Right/Left Knee Right;Left   Right Knee Extension 0   Right Knee Flexion 115   Left Knee Extension 5   Left Knee Flexion 100     Strength   Strength Assessment Site Hip;Knee;Ankle   Right/Left Hip Right;Left   Right Hip Flexion 5/5   Right Hip Extension 3+/5   Right Hip ABduction 4+/5   Left Hip Flexion 5/5   Left Hip Extension 3/5   Left Hip ABduction 4/5   Right/Left Knee Right;Left   Right Knee Flexion 4+/5  seated   Right Knee Extension 5/5   Left Knee Flexion 4/5  seated    Left Knee Extension  5/5   Right/Left Ankle Right;Left   Right Ankle Dorsiflexion 5/5   Left Ankle Dorsiflexion 5/5     Palpation   Patella mobility not assessed this visit     Transfers   Five time sit to stand comments  14.55 sec, No UE, weight shift Rt   Comments TUG: 12.4 sec with SPC     Ambulation/Gait   Ambulation/Gait Yes   Assistive device Straight cane   Gait Pattern Decreased stance time - left;Decreased step length - right;Decreased weight shift to left     Balance   Balance Assessed --                   [facility] Adult PT Treatment/Exercise - 03/23/16 0001      Exercises   Exercises Knee/Hip     Knee/Hip Exercises: Seated   Knee/Hip Flexion Lt knee flexion stretch with RLE 10x10 sec holds   Sit to Sand 15 reps;without UE support  verbal cues for equal weight shift      Knee/Hip Exercises: Supine   Bridges Both;1 set;10 reps     Knee/Hip Exercises: Sidelying   Clams x15 each, red TB                PT Education - 03/23/16 1109    Education provided Yes   Education Details eval findings/POC; importance of full knee ROM for functional tasks; Sit to stand technique with equal weight bearing; expectations for eval and future sessions.    Person(s) Educated Patient   Methods Explanation;Verbal cues;Handout   Comprehension Verbalized understanding;Returned demonstration          PT Short Term Goals - 03/23/16 1108      PT SHORT TERM GOAL #1   Title Pt will demo consistency and independence with HEP   Time 3   Period Weeks   Status New     PT SHORT TERM GOAL #2   Title Pt will report no greater than 4/10 pain on VAS to improve tolerance to daily activity.   Time 3   Period Weeks   Status New     PT SHORT TERM GOAL #3   Title Pt will demo improved sit to stand technique throughout the session, evident by equal weight shift and minimal use of UE support, without cues from the therapist.   Time 3   Period Weeks   Status New     PT SHORT TERM GOAL #4    Title Pt will demo Lt  knee ROM of atleast 0 deg ext and 115 deg flexion in order to improve her functional mobility.   Time 3   Period Weeks   Status New     PT SHORT TERM GOAL #5   Title pt will demo improved gait mechanics of equal step length and upright posture for atleast 226 ft without cues from PT.    Time 3   Period Weeks   Status New           PT Long Term Goals - 03/23/16 1109      PT LONG TERM GOAL #1   Title Pt will demo improved functional strength evident by completing 5x sit to stand in atleast 11 sec without UE support and with equal weight shift.   Time 6   Period Weeks   Status New     PT LONG TERM GOAL #2   Title Pt will demo improved functional strength and balance evident by her ability to complete TUG in less than 13 sec without AD.   Time 6   Period Weeks   Status New     PT LONG TERM GOAL #3   Title Pt will report no greater than 2/10 pain during daily activity to improve tolerance to ADLs.   Time 6   Period Weeks   Status New               Plan - 03/23/16 1112    Clinical Impression Statement Ms. Stephanie [name] is a pleasant 65yo F referred to OPPT s/p Lt TKR on 03/04/16. After her surgery, she received HHPT and has continued to perform her HEP. She demonstrates mild post-surgical pain and swelling as well as limited knee ROM and strength impacting her activity tolerance and functional mobility. Unable to fully assess scar healing and patellar mobility secondary to steri strips still mostly intact. I addressed mechanics with sit to stand and reviewed therex for her HEP. She would benefit from skilled PT to address limitations listed above and to return to unrestricted activity participation.     Rehab Potential Good   PT Frequency 2x / week   PT Duration 6 weeks   PT Treatment/Interventions ADLs/Self Care Home Management;Aquatic Therapy;Electrical Stimulation;Moist Heat;Neuromuscular re-education;Balance training;Therapeutic exercise;Therapeutic  activities;Stair training;Gait training;Patient/family education;Manual techniques;Scar mobilization;Passive range of motion;Taping   PT Next Visit Plan gait training for equal step length/decreased hip abduction; knee extension/flexion PROM/manual techniques; open chain strengthening once ROM is WNL   PT Home Exercise Plan see pt instructions   Recommended Other Services none    Consulted and Agree with Plan of Care Patient      Patient will benefit from skilled therapeutic intervention in order to improve the following deficits and impairments:  Abnormal gait, Decreased activity tolerance, Decreased balance, Decreased range of motion, Decreased mobility, Hypomobility, Increased edema, Impaired sensation, Pain, Increased muscle spasms, Increased fascial restricitons, Improper body mechanics, Postural dysfunction, Impaired flexibility  Visit Diagnosis: Pain in left knee  Stiffness of left knee, not elsewhere classified  Other abnormalities of gait and mobility  Unsteadiness on feet  Localized edema     Problem List Patient Active Problem List   Diagnosis Date Noted  . Osteoarthritis of left knee   . Arthritis of knee, degenerative 02/23/2013  . ACHILLES TENDINITIS 06/04/2009  . ANKLE PAIN, LEFT 12/01/2007   11:41 AM,03/23/16 Marylyn IshiharaSara Kiser PT, DPT [facility] [phone]  [facility] [address] [place], [place], [address] Phone: [phone]  Fax:  [phone]  Name: Stephanie [name] MRN: [id number] Date of Birth: Sep 17, 1950

## 2016-03-27 ENCOUNTER — Ambulatory Visit (HOSPITAL_COMMUNITY): Payer: BLUE CROSS/BLUE SHIELD | Admitting: Physical Therapy

## 2016-03-27 DIAGNOSIS — M25562 Pain in left knee: Secondary | ICD-10-CM | POA: Diagnosis not present

## 2016-03-27 DIAGNOSIS — R2681 Unsteadiness on feet: Secondary | ICD-10-CM

## 2016-03-27 DIAGNOSIS — M25662 Stiffness of left knee, not elsewhere classified: Secondary | ICD-10-CM

## 2016-03-27 DIAGNOSIS — R600 Localized edema: Secondary | ICD-10-CM

## 2016-03-27 DIAGNOSIS — R2689 Other abnormalities of gait and mobility: Secondary | ICD-10-CM

## 2016-03-27 NOTE — Therapy (Signed)
[place] [facility] [address] [place], [place], [address] Phone: [phone]   Fax:  [phone]  Physical Therapy Treatment  Patient Details  Name: Stephanie [name] MRN: [id number] Date of Birth: August 09, 1951 Referring Provider: Fuller Canada, MD  Encounter Date: 03/27/2016      PT End of Session - 03/27/16 1030    Visit Number 2   Number of Visits 12   Date for PT Re-Evaluation 04/13/16   Authorization Type BCBS   Authorization Time Period 03/23/16 to 05/08/16   PT Start Time 0947   PT Stop Time 1025   PT Time Calculation (min) 38 min   Equipment Utilized During Treatment Gait belt   Activity Tolerance Patient tolerated treatment well   Behavior During Therapy [facility] for tasks assessed/performed      Past Medical History:  Diagnosis Date  . Diabetes mellitus without complication (HCC)   . GERD (gastroesophageal reflux disease)   . Hypercholesteremia   . PONV (postoperative nausea and vomiting)     Past Surgical History:  Procedure Laterality Date  . ABDOMINAL HYSTERECTOMY    . COLONOSCOPY N/A 12/26/2012   Procedure: COLONOSCOPY;  Surgeon: Corbin Ade, MD;  [facility];  Service: Endoscopy;  Laterality: N/A;  9:15 AM  . TOTAL KNEE ARTHROPLASTY Left 03/04/2016   Procedure: LEFT TOTAL KNEE ARTHROPLASTY;  Surgeon: Vickki Hearing, MD;  Location: [facility]S;  Service: Orthopedics;  Laterality: Left;  . TUBAL LIGATION      There were no vitals filed for this visit.      Subjective Assessment - 03/27/16 0949    Subjective Pt reports her knee is doing well. Sometimes notices stiffness the more she does. She has been trying to do her HEP 2x/day. No other complaints.    Pertinent History GERD, DM   Limitations Walking   How long can you sit comfortably? 20-30 minutes   How long can you stand comfortably? unsure   How long can you walk comfortably? maybe 20 minutes    Patient Stated Goals improve ROM and strength    Currently in Pain? No/denies            [facility] PT Assessment - 03/27/16 0001      AROM   Right Knee Extension 0   Right Knee Flexion 115   Left Knee Extension 3   Left Knee Flexion 100                     [facility] Adult PT Treatment/Exercise - 03/27/16 0001      Ambulation/Gait   Pre-Gait Activities retrostepping in // bars x2 min   Gait Comments Visual/verbal cues to correct heel contact, knee flexion during toe off and decrease abducted gait. X15 ft, 5 RT. Ambulating around gym x263ft with SPC.     Knee/Hip Exercises: Seated   Heel Slides Left;2 sets;15 reps   Heel Slides Limitations red TB     Knee/Hip Exercises: Supine   Bridges 2 sets;15 reps   Other Supine Knee/Hip Exercises straight leg bridge on 4" box with foam 2x15     Manual Therapy   Manual Therapy Joint mobilization;Soft tissue mobilization   Manual therapy comments performed separate from other interventions    Joint Mobilization Medial rotational grade III-IV tibiofemoral jt mobs in sitting, knee at ~90deg; Lateral rotational grade III-IV tibiofemoral joint mobilizations in prone   Soft tissue mobilization scar massage  PT Education - 03/27/16 1028    Education provided Yes   Education Details encouraged pt to increase reps with therex to improve knee flexion; speak with surgeon about recommended incision care; importance of proper gait sequencing   Person(s) Educated Patient   Methods Explanation;Demonstration   Comprehension Verbalized understanding;Returned demonstration          PT Short Term Goals - 03/23/16 1108      PT SHORT TERM GOAL #1   Title Pt will demo consistency and independence with HEP   Time 3   Period Weeks   Status New     PT SHORT TERM GOAL #2   Title Pt will report no greater than 4/10 pain on VAS to improve tolerance to daily activity.   Time 3   Period Weeks   Status New     PT SHORT TERM GOAL #3   Title Pt will demo improved sit to  stand technique throughout the session, evident by equal weight shift and minimal use of UE support, without cues from the therapist.   Time 3   Period Weeks   Status New     PT SHORT TERM GOAL #4   Title Pt will demo Lt knee ROM of atleast 0 deg ext and 115 deg flexion in order to improve her functional mobility.   Time 3   Period Weeks   Status New     PT SHORT TERM GOAL #5   Title pt will demo improved gait mechanics of equal step length and upright posture for atleast 226 ft without cues from PT.    Time 3   Period Weeks   Status New           PT Long Term Goals - 03/23/16 1109      PT LONG TERM GOAL #1   Title Pt will demo improved functional strength evident by completing 5x sit to stand in atleast 11 sec without UE support and with equal weight shift.   Time 6   Period Weeks   Status New     PT LONG TERM GOAL #2   Title Pt will demo improved functional strength and balance evident by her ability to complete TUG in less than 13 sec without AD.   Time 6   Period Weeks   Status New     PT LONG TERM GOAL #3   Title Pt will report no greater than 2/10 pain during daily activity to improve tolerance to ADLs.   Time 6   Period Weeks   Status New               Plan - 03/27/16 1030    Clinical Impression Statement Today's session focused on manual techniques to improve knee ROM and scar mobility. Pt demonstrates limited knee ROM which is within ~5 degrees of her RLE currently, and she reported no increase in pain with manual therapy. Ended session with gait training to address pt's abducted gait and decreased heel contact, knee flexion. She was able to correct technique with increased visual and verbal cues. Will continue to address this.    Rehab Potential Good   PT Frequency 2x / week   PT Duration 6 weeks   PT Treatment/Interventions ADLs/Self Care Home Management;Aquatic Therapy;Electrical Stimulation;Moist Heat;Neuromuscular re-education;Balance  training;Therapeutic exercise;Therapeutic activities;Stair training;Gait training;Patient/family education;Manual techniques;Scar mobilization;Passive range of motion;Taping   PT Next Visit Plan gait training for equal step length/decreased hip abduction; knee extension/flexion PROM/manual techniques; open chain strengthening once ROM is  WNL   PT Home Exercise Plan see pt instructions   Consulted and Agree with Plan of Care Patient      Patient will benefit from skilled therapeutic intervention in order to improve the following deficits and impairments:  Abnormal gait, Decreased activity tolerance, Decreased balance, Decreased range of motion, Decreased mobility, Hypomobility, Increased edema, Impaired sensation, Pain, Increased muscle spasms, Increased fascial restricitons, Improper body mechanics, Postural dysfunction, Impaired flexibility  Visit Diagnosis: Pain in left knee  Stiffness of left knee, not elsewhere classified  Other abnormalities of gait and mobility  Unsteadiness on feet  Localized edema     Problem List Patient Active Problem List   Diagnosis Date Noted  . Osteoarthritis of left knee   . Arthritis of knee, degenerative 02/23/2013  . ACHILLES TENDINITIS 06/04/2009  . ANKLE PAIN, LEFT 12/01/2007    10:43 AM,03/27/16 Marylyn Ishihara PT, DPT [facility] [phone]  [facility] [address] [place], [place], [address] Phone: [phone]   Fax:  [phone]  Name: RANIAH KARAN MRN: [id number] Date of Birth: [birth year]

## 2016-03-30 ENCOUNTER — Encounter: Payer: Self-pay | Admitting: Orthopedic Surgery

## 2016-03-30 ENCOUNTER — Ambulatory Visit (INDEPENDENT_AMBULATORY_CARE_PROVIDER_SITE_OTHER): Payer: BLUE CROSS/BLUE SHIELD | Admitting: Orthopedic Surgery

## 2016-03-30 VITALS — BP 114/72 | Ht 62.0 in | Wt 183.0 lb

## 2016-03-30 DIAGNOSIS — Z4789 Encounter for other orthopedic aftercare: Secondary | ICD-10-CM

## 2016-03-30 DIAGNOSIS — Z96652 Presence of left artificial knee joint: Secondary | ICD-10-CM

## 2016-03-30 MED ORDER — HYDROCODONE-ACETAMINOPHEN 7.5-325 MG PO TABS: 1.0000 | ORAL_TABLET | Freq: Three times a day (TID) | ORAL | 0 refills | Status: DC | PRN

## 2016-03-30 NOTE — Progress Notes (Signed)
Patient ID: Marcelyn BruinsGwendolyn M Peth, female   DOB: [birth year], 65 y.o.   MRN: [id number]  Follow up visit  Chief Complaint  Patient presents with  . Follow-up    S/P left total knee 03/04/16 follow up check    BP 114/72   Ht 5\' 2"  (1.575 m)   Wt 183 lb (83 kg)   BMI 33.47 kg/m   Encounter Diagnoses  Name Primary?  . Status post total left knee replacement   . Surgical aftercare, musculoskeletal system Yes   The patient is ambulatory with a cane Pain is controlled with as needed and hydrocodone 7.5  The wound is clean dry and intact  The knee is perfectly straight  Flexion is past 90  Follow-up in October 3:04 PM Fuller CanadaStanley Harrison, MD 03/30/2016

## 2016-03-31 ENCOUNTER — Ambulatory Visit (HOSPITAL_COMMUNITY): Payer: BLUE CROSS/BLUE SHIELD | Admitting: Physical Therapy

## 2016-03-31 DIAGNOSIS — R2689 Other abnormalities of gait and mobility: Secondary | ICD-10-CM

## 2016-03-31 DIAGNOSIS — R2681 Unsteadiness on feet: Secondary | ICD-10-CM

## 2016-03-31 DIAGNOSIS — R600 Localized edema: Secondary | ICD-10-CM

## 2016-03-31 DIAGNOSIS — M25562 Pain in left knee: Secondary | ICD-10-CM | POA: Diagnosis not present

## 2016-03-31 DIAGNOSIS — M25662 Stiffness of left knee, not elsewhere classified: Secondary | ICD-10-CM

## 2016-03-31 NOTE — Therapy (Signed)
[facility] [address] [employer] [place], [place], [address] Phone: [phone]   Fax:  [phone]  Physical Therapy Treatment  Patient Details  Name: Stephanie [name] MRN: [id number] Date of Birth: July 10, 1951 Referring Provider: Fuller CanadaStanley Harrison, MD  Encounter Date: 03/31/2016      PT End of Session - 03/31/16 0942    Visit Number 3   Number of Visits 12   Date for PT Re-Evaluation 04/13/16   Authorization Type BCBS   Authorization Time Period 03/23/16 to 05/08/16   PT Start Time 0901   PT Stop Time 0941   PT Time Calculation (min) 40 min   Activity Tolerance Patient tolerated treatment well   Behavior During Therapy [facility] for tasks assessed/performed      Past Medical History:  Diagnosis Date  . Diabetes mellitus without complication (HCC)   . GERD (gastroesophageal reflux disease)   . Hypercholesteremia   . PONV (postoperative nausea and vomiting)     Past Surgical History:  Procedure Laterality Date  . ABDOMINAL HYSTERECTOMY    . COLONOSCOPY N/A 12/26/2012   Procedure: COLONOSCOPY;  Surgeon: Corbin Adeobert M Rourk, MD;  [facility];  Service: Endoscopy;  Laterality: N/A;  9:15 AM  . TOTAL KNEE ARTHROPLASTY Left 03/04/2016   Procedure: LEFT TOTAL KNEE ARTHROPLASTY;  Surgeon: Vickki HearingStanley E Harrison, MD;  Location: [facility];  Service: Orthopedics;  Laterality: Left;  . TUBAL LIGATION      There were no vitals filed for this visit.      Subjective Assessment - 03/31/16 0904    Subjective Patient arrives stating she is doing well, she is stiff in the mornings but nothing else really major going on right now.    Pertinent History GERD, DM   Currently in Pain? No/denies                         [facility] Adult PT Treatment/Exercise - 03/31/16 0001      Ambulation/Gait   Ambulation/Gait Yes   Ambulation/Gait Assistance 6: Modified independent (Device/Increase time)   Ambulation Distance (Feet) 452 Feet   Assistive  device None   Gait Pattern Decreased stance time - left;Decreased step length - right;Decreased weight shift to left   Gait Comments vebal cues for form      Knee/Hip Exercises: Stretches   Active Hamstring Stretch Both;3 reps;30 seconds   Active Hamstring Stretch Limitations 12 inch box    Knee: Self-Stretch to increase Flexion Left   Knee: Self-Stretch Limitations 10 reps, 10 second holds    Gastroc Stretch 3 reps;30 seconds   Gastroc Stretch Limitations slantboard      Knee/Hip Exercises: Standing   Heel Raises Both;1 set;10 reps   Heel Raises Limitations heel and toe    Rocker Board 2 minutes   Rocker Board Limitations AP and lateral, U HHA      Knee/Hip Exercises: Seated   Hamstring Curl AAROM;Left;1 set;15 reps   Hamstring Limitations 3 second holds      Manual Therapy   Manual Therapy Joint mobilization;Soft tissue mobilization   Manual therapy comments performed separate from other interventions    Joint Mobilization knee flexion mobs grade III, patella mobs all directions    Soft tissue mobilization scar massage   Passive ROM AAROM overpressure through extension and flexion                 PT Education - 03/31/16 0942    Education provided Yes  Education Details continue ice and HEP at home, work on gait slow and deliberately for form with heel-toe/reduced ABD gait    Person(s) Educated Patient   Methods Explanation   Comprehension Verbalized understanding          PT Short Term Goals - 03/23/16 1108      PT SHORT TERM GOAL #1   Title Pt will demo consistency and independence with HEP   Time 3   Period Weeks   Status New     PT SHORT TERM GOAL #2   Title Pt will report no greater than 4/10 pain on VAS to improve tolerance to daily activity.   Time 3   Period Weeks   Status New     PT SHORT TERM GOAL #3   Title Pt will demo improved sit to stand technique throughout the session, evident by equal weight shift and minimal use of UE support,  without cues from the therapist.   Time 3   Period Weeks   Status New     PT SHORT TERM GOAL #4   Title Pt will demo Lt knee ROM of atleast 0 deg ext and 115 deg flexion in order to improve her functional mobility.   Time 3   Period Weeks   Status New     PT SHORT TERM GOAL #5   Title pt will demo improved gait mechanics of equal step length and upright posture for atleast 226 ft without cues from PT.    Time 3   Period Weeks   Status New           PT Long Term Goals - 03/23/16 1109      PT LONG TERM GOAL #1   Title Pt will demo improved functional strength evident by completing 5x sit to stand in atleast 11 sec without UE support and with equal weight shift.   Time 6   Period Weeks   Status New     PT LONG TERM GOAL #2   Title Pt will demo improved functional strength and balance evident by her ability to complete TUG in less than 13 sec without AD.   Time 6   Period Weeks   Status New     PT LONG TERM GOAL #3   Title Pt will report no greater than 2/10 pain during daily activity to improve tolerance to ADLs.   Time 6   Period Weeks   Status New               Plan - 03/31/16 7564    Clinical Impression Statement Performed functional stretches followed by manual for knee ROM and also functional exercises based around ROM today as well. Performed gait training with cane to promote correct heel-toe pattern as well as mechanics with good posture and minimal unsteadiness this session. Occasional cues for form throughout exercises and activities today.  Did note some edema around L knee joint this session and introduced retrograde edema to help address, also encouraged patient to continue with ice at home. Initiated pre-gait activities and ended session with gait training activities.    Rehab Potential Good   PT Frequency 2x / week   PT Duration 6 weeks   PT Next Visit Plan gait training for equal step length/decreased hip abduction; knee extension/flexion  PROM/manual techniques; open chain strengthening once ROM is WNL   PT Home Exercise Plan see pt instructions   Consulted and Agree with Plan of Care Patient  Patient will benefit from skilled therapeutic intervention in order to improve the following deficits and impairments:  Abnormal gait, Decreased activity tolerance, Decreased balance, Decreased range of motion, Decreased mobility, Hypomobility, Increased edema, Impaired sensation, Pain, Increased muscle spasms, Increased fascial restricitons, Improper body mechanics, Postural dysfunction, Impaired flexibility  Visit Diagnosis: Pain in left knee  Stiffness of left knee, not elsewhere classified  Other abnormalities of gait and mobility  Unsteadiness on feet  Localized edema     Problem List Patient Active Problem List   Diagnosis Date Noted  . Osteoarthritis of left knee   . Arthritis of knee, degenerative 02/23/2013  . ACHILLES TENDINITIS 06/04/2009  . ANKLE PAIN, LEFT 12/01/2007    Nedra HaiKristen Unger PT, DPT [phone]  [facility] [address] [place], [place], [address] Phone: [phone]   Fax:  [phone]  Name: Stephanie [name] MRN: [id number] Date of Birth: September 27, 1950

## 2016-04-02 ENCOUNTER — Ambulatory Visit (HOSPITAL_COMMUNITY): Payer: BLUE CROSS/BLUE SHIELD | Admitting: Physical Therapy

## 2016-04-02 DIAGNOSIS — M25562 Pain in left knee: Secondary | ICD-10-CM | POA: Diagnosis not present

## 2016-04-02 DIAGNOSIS — M25662 Stiffness of left knee, not elsewhere classified: Secondary | ICD-10-CM

## 2016-04-02 DIAGNOSIS — R2681 Unsteadiness on feet: Secondary | ICD-10-CM

## 2016-04-02 DIAGNOSIS — R600 Localized edema: Secondary | ICD-10-CM

## 2016-04-02 DIAGNOSIS — R2689 Other abnormalities of gait and mobility: Secondary | ICD-10-CM

## 2016-04-02 NOTE — Therapy (Signed)
[facility] [address] [place], [place], [address] Phone: [phone]   Fax:  [phone]  Physical Therapy Treatment  Patient Details  Name: Stephanie BruinsGwendolyn M Amos MRN: [id number] Date of Birth: 04/14/1951 Referring Provider: Fuller CanadaStanley Harrison, MD  Encounter Date: 04/02/2016      PT End of Session - 04/02/16 0927    Visit Number 4   Number of Visits 12   Date for PT Re-Evaluation 04/13/16   Authorization Type BCBS   Authorization Time Period 03/23/16 to 05/08/16   PT Start Time 0904   PT Stop Time 0944   PT Time Calculation (min) 40 min   Activity Tolerance Patient tolerated treatment well   Behavior During Therapy [facility] for tasks assessed/performed      Past Medical History:  Diagnosis Date  . Diabetes mellitus without complication (HCC)   . GERD (gastroesophageal reflux disease)   . Hypercholesteremia   . PONV (postoperative nausea and vomiting)     Past Surgical History:  Procedure Laterality Date  . ABDOMINAL HYSTERECTOMY    . COLONOSCOPY N/A 12/26/2012   Procedure: COLONOSCOPY;  Surgeon: Corbin Adeobert M Rourk, MD;  [facility];  Service: Endoscopy;  Laterality: N/A;  9:15 AM  . TOTAL KNEE ARTHROPLASTY Left 03/04/2016   Procedure: LEFT TOTAL KNEE ARTHROPLASTY;  Surgeon: Vickki HearingStanley E Harrison, MD;  Location: [facility];  Service: Orthopedics;  Laterality: Left;  . TUBAL LIGATION      There were no vitals filed for this visit.      Subjective Assessment - 04/02/16 0905    Subjective Pt arrives reporting she is doing good. She is just stiff this morning. Last week she reports she had to take a pain pill, but overall she is trying to take less.   Pertinent History GERD, DM   Currently in Pain? No/denies            [facility] - 04/02/16 0001      AROM   Left Knee Flexion 112                     [facility] Adult PT Treatment/Exercise - 04/02/16 0001      Ambulation/Gait   Ambulation/Gait Yes   Ambulation/Gait Assistance 7: Independent   Ambulation Distance (Feet) 225 Feet   Assistive device None   Gait Pattern Decreased stance time - left;Decreased step length - right;Decreased weight shift to left   Pre-Gait Activities RLE step over small hurdles to increase Lt stance time for 15 ft, x3 RT    Gait Comments LLE stance time improve with verbal cues to take bigger steps     Knee/Hip Exercises: Standing   Heel Raises 2 sets;Left;15 reps   Heel Raises Limitations single leg    Forward Lunges 1 set;10 reps;5 seconds;Left   Forward Lunges Limitations on 12" step to improve knee flexion ROM    Forward Step Up Left;Hand Hold: 1;Step Height: 4";2 sets;15 reps   Rocker Board 2 minutes   Rocker Board Limitations AP, Lt/Rt 1 UE decreased to no UE support    SLS Rt: 15 sec Lt: 5-8 sec, x3 trials      Knee/Hip Exercises: Supine   Other Supine Knee/Hip Exercises straight leg bridge on 6" box with foam 2x15     Manual Therapy   Manual Therapy Joint mobilization;Soft tissue mobilization   Manual therapy comments performed separate from other interventions    Joint Mobilization seated grade III-IV Lt posterior/medial tibial rotational  mobs; grade III-IV Lt proximal fibular mobs    Soft tissue mobilization scar massage                PT Education - 04/02/16 0927    Education provided Yes   Education Details technique with therex; gait training to decreased abducted pattern    Person(s) Educated Patient   Methods Explanation;Verbal cues   Comprehension Verbalized understanding;Returned demonstration;Need further instruction          PT Short Term Goals - 03/23/16 1108      PT SHORT TERM GOAL #1   Title Pt will demo consistency and independence with HEP   Time 3   Period Weeks   Status New     PT SHORT TERM GOAL #2   Title Pt will report no greater than 4/10 pain on VAS to improve tolerance to daily activity.   Time 3   Period Weeks   Status New     PT SHORT TERM  GOAL #3   Title Pt will demo improved sit to stand technique throughout the session, evident by equal weight shift and minimal use of UE support, without cues from the therapist.   Time 3   Period Weeks   Status New     PT SHORT TERM GOAL #4   Title Pt will demo Lt knee ROM of atleast 0 deg ext and 115 deg flexion in order to improve her functional mobility.   Time 3   Period Weeks   Status New     PT SHORT TERM GOAL #5   Title pt will demo improved gait mechanics of equal step length and upright posture for atleast 226 ft without cues from PT.    Time 3   Period Weeks   Status New           PT Long Term Goals - 03/23/16 1109      PT LONG TERM GOAL #1   Title Pt will demo improved functional strength evident by completing 5x sit to stand in atleast 11 sec without UE support and with equal weight shift.   Time 6   Period Weeks   Status New     PT LONG TERM GOAL #2   Title Pt will demo improved functional strength and balance evident by her ability to complete TUG in less than 13 sec without AD.   Time 6   Period Weeks   Status New     PT LONG TERM GOAL #3   Title Pt will report no greater than 2/10 pain during daily activity to improve tolerance to ADLs.   Time 6   Period Weeks   Status New               Plan - 04/02/16 0929    Clinical Impression Statement Pt continues to make progress towards her goals. She demonstrates improved knee flexion ROM this visit after manual treatment and without pain reported during and after the session. Ended with gait training to decrease noted abducted pattern during pt's ambulation with improvements noted when verbal cues are provided. Encouraged pt to be more aware of this at home when she is walking without her SPC.   Rehab Potential Good   PT Frequency 2x / week   PT Duration 6 weeks   PT Treatment/Interventions ADLs/Self Care Home Management;Aquatic Therapy;Electrical Stimulation;Moist Heat;Neuromuscular  re-education;Balance training;Therapeutic exercise;Therapeutic activities;Stair training;Gait training;Patient/family education;Manual techniques;Scar mobilization;Passive range of motion;Taping   PT Next Visit Plan knee flexion PROM/manual techniques;  progress closed chain strengthening and single leg balance    PT Home Exercise Plan no updates this visit   Consulted and Agree with Plan of Care Patient      Patient will benefit from skilled therapeutic intervention in order to improve the following deficits and impairments:  Abnormal gait, Decreased activity tolerance, Decreased balance, Decreased range of motion, Decreased mobility, Hypomobility, Increased edema, Impaired sensation, Pain, Increased muscle spasms, Increased fascial restricitons, Improper body mechanics, Postural dysfunction, Impaired flexibility  Visit Diagnosis: Pain in left knee  Stiffness of left knee, not elsewhere classified  Other abnormalities of gait and mobility  Unsteadiness on feet  Localized edema     Problem List Patient Active Problem List   Diagnosis Date Noted  . Osteoarthritis of left knee   . Arthritis of knee, degenerative 02/23/2013  . ACHILLES TENDINITIS 06/04/2009  . ANKLE PAIN, LEFT 12/01/2007    9:49 AM,04/02/16 Marylyn Ishihara PT, DPT [facility] [phone]  [facility] [address] [place], [place], [address] Phone: [phone]   Fax:  [phone]  Name: MADINA GALATI MRN: [id number] Date of Birth: 1951/07/06

## 2016-04-07 ENCOUNTER — Ambulatory Visit (HOSPITAL_COMMUNITY): Payer: BLUE CROSS/BLUE SHIELD | Admitting: Physical Therapy

## 2016-04-07 DIAGNOSIS — R2681 Unsteadiness on feet: Secondary | ICD-10-CM

## 2016-04-07 DIAGNOSIS — M25562 Pain in left knee: Secondary | ICD-10-CM | POA: Diagnosis not present

## 2016-04-07 DIAGNOSIS — M25662 Stiffness of left knee, not elsewhere classified: Secondary | ICD-10-CM

## 2016-04-07 DIAGNOSIS — R2689 Other abnormalities of gait and mobility: Secondary | ICD-10-CM

## 2016-04-07 DIAGNOSIS — R600 Localized edema: Secondary | ICD-10-CM

## 2016-04-07 NOTE — Therapy (Signed)
[facility] [address] [place], [place], [address] Phone: [phone]   Fax:  [phone]  Physical Therapy Treatment  Patient Details  Name: Stephanie [name] MRN: [id number] Date of Birth: 14-Apr-1951 Referring Provider: Fuller CanadaStanley Harrison, MD  Encounter Date: 04/07/2016      PT End of Session - 04/07/16 0926    Visit Number 5   Number of Visits 12   Date for PT Re-Evaluation 04/13/16   Authorization Type BCBS   Authorization Time Period 03/23/16 to 05/08/16   PT Start Time 0901   PT Stop Time 0943   PT Time Calculation (min) 42 min   Activity Tolerance Patient tolerated treatment well   Behavior During Therapy [facility] for tasks assessed/performed      Past Medical History:  Diagnosis Date  . Diabetes mellitus without complication (HCC)   . GERD (gastroesophageal reflux disease)   . Hypercholesteremia   . PONV (postoperative nausea and vomiting)     Past Surgical History:  Procedure Laterality Date  . ABDOMINAL HYSTERECTOMY    . COLONOSCOPY N/A 12/26/2012   Procedure: COLONOSCOPY;  Surgeon: Corbin Adeobert M Rourk, MD;  [facility];  Service: Endoscopy;  Laterality: N/A;  9:15 AM  . TOTAL KNEE ARTHROPLASTY Left 03/04/2016   Procedure: LEFT TOTAL KNEE ARTHROPLASTY;  Surgeon: Vickki HearingStanley E Harrison, MD;  Location: [facility];  Service: Orthopedics;  Laterality: Left;  . TUBAL LIGATION      There were no vitals filed for this visit.      Subjective Assessment - 04/07/16 0902    Subjective Pt reports things are going well. She has been feeling stiffness more than anything and only has aching occasionally. She worked on bending her knee some yesterday.   Pertinent History GERD, DM   Currently in Pain? No/denies            [facility] Assessment - 04/07/16 0001      AROM   Left Knee Flexion 114  109 pre treatment                     [facility] Adult PT Treatment/Exercise - 04/07/16 0001      Knee/Hip Exercises:  Stretches   Active Hamstring Stretch 2 reps;30 seconds   Active Hamstring Stretch Limitations 12" box    Gastroc Stretch Both;3 reps;30 seconds   Gastroc Stretch Limitations slant board      Knee/Hip Exercises: Standing   Heel Raises 2 sets;15 reps   Heel Raises Limitations single leg    Forward Step Up Both;2 sets;15 reps;Hand Hold: 1;Step Height: 6"   Functional Squat 15 reps;Other (comment);1 set  slow eccentric lowering, no UE    Wall Squat 2 sets;15 reps   SLS Rt: 24 sec, Lt: 20 sec max of 2 trials      Manual Therapy   Manual Therapy Joint mobilization   Manual therapy comments performed separate from other interventions    Joint Mobilization MWM supine Lt knee flexion 2x10             Balance Exercises - 04/07/16 0931      Balance Exercises: Standing   Tandem Stance Eyes open;Foam/compliant surface;2 reps;30 secs   SLS 2 reps;30 secs;Eyes open   Tandem Gait Forward;4 reps   Partial Tandem Stance Eyes open;3 reps  gait along blue line           PT Education - 04/07/16 0925    Education provided Yes   Education  Details discussed POC; HEP adherence and use of stationary bike to improve LE fitness    Person(s) Educated Patient   Methods Explanation   Comprehension Verbalized understanding          PT Short Term Goals - 03/23/16 1108      PT SHORT TERM GOAL #1   Title Pt will demo consistency and independence with HEP   Time 3   Period Weeks   Status New     PT SHORT TERM GOAL #2   Title Pt will report no greater than 4/10 pain on VAS to improve tolerance to daily activity.   Time 3   Period Weeks   Status New     PT SHORT TERM GOAL #3   Title Pt will demo improved sit to stand technique throughout the session, evident by equal weight shift and minimal use of UE support, without cues from the therapist.   Time 3   Period Weeks   Status New     PT SHORT TERM GOAL #4   Title Pt will demo Lt knee ROM of atleast 0 deg ext and 115 deg flexion in  order to improve her functional mobility.   Time 3   Period Weeks   Status New     PT SHORT TERM GOAL #5   Title pt will demo improved gait mechanics of equal step length and upright posture for atleast 226 ft without cues from PT.    Time 3   Period Weeks   Status New           PT Long Term Goals - 03/23/16 1109      PT LONG TERM GOAL #1   Title Pt will demo improved functional strength evident by completing 5x sit to stand in atleast 11 sec without UE support and with equal weight shift.   Time 6   Period Weeks   Status New     PT LONG TERM GOAL #2   Title Pt will demo improved functional strength and balance evident by her ability to complete TUG in less than 13 sec without AD.   Time 6   Period Weeks   Status New     PT LONG TERM GOAL #3   Title Pt will report no greater than 2/10 pain during daily activity to improve tolerance to ADLs.   Time 6   Period Weeks   Status New               Plan - 04/07/16 0941    Clinical Impression Statement Pt continues to make progress towards all goals with improved knee ROM, strength and balance. She does demonstrate some limitations in knee flexion which continues to improve. She was able to tolerate all progressions with therex with reports of muscle fatigue but no increased pain by the end of the session.   Rehab Potential Good   PT Frequency 2x / week   PT Duration 6 weeks   PT Treatment/Interventions ADLs/Self Care Home Management;Aquatic Therapy;Electrical Stimulation;Moist Heat;Neuromuscular re-education;Balance training;Therapeutic exercise;Therapeutic activities;Stair training;Gait training;Patient/family education;Manual techniques;Scar mobilization;Passive range of motion;Taping   PT Next Visit Plan knee flexion PROM/manual techniques; progress closed chain strengthening and balance   PT Home Exercise Plan single leg stance   Consulted and Agree with Plan of Care Patient      Patient will benefit from skilled  therapeutic intervention in order to improve the following deficits and impairments:  Abnormal gait, Decreased activity tolerance, Decreased balance, Decreased range of motion,  Decreased mobility, Hypomobility, Increased edema, Impaired sensation, Pain, Increased muscle spasms, Increased fascial restricitons, Improper body mechanics, Postural dysfunction, Impaired flexibility  Visit Diagnosis: Pain in left knee  Stiffness of left knee, not elsewhere classified  Other abnormalities of gait and mobility  Unsteadiness on feet  Localized edema     Problem List Patient Active Problem List   Diagnosis Date Noted  . Osteoarthritis of left knee   . Arthritis of knee, degenerative 02/23/2013  . ACHILLES TENDINITIS 06/04/2009  . ANKLE PAIN, LEFT 12/01/2007    9:45 AM,04/07/16 Marylyn Ishihara PT, DPT [facility] [phone]  [facility] [address] [place], [place], [address] Phone: [phone]   Fax:  [phone]  Name: Stephanie [name] MRN: [id number] Date of Birth: 10-10-1950

## 2016-04-09 ENCOUNTER — Ambulatory Visit (HOSPITAL_COMMUNITY): Payer: BLUE CROSS/BLUE SHIELD | Admitting: Physical Therapy

## 2016-04-09 DIAGNOSIS — R2681 Unsteadiness on feet: Secondary | ICD-10-CM

## 2016-04-09 DIAGNOSIS — M25562 Pain in left knee: Secondary | ICD-10-CM

## 2016-04-09 DIAGNOSIS — M25662 Stiffness of left knee, not elsewhere classified: Secondary | ICD-10-CM

## 2016-04-09 DIAGNOSIS — R2689 Other abnormalities of gait and mobility: Secondary | ICD-10-CM

## 2016-04-09 DIAGNOSIS — R600 Localized edema: Secondary | ICD-10-CM

## 2016-04-09 NOTE — Therapy (Signed)
[facility] [address] [place], [place], [address] Phone: [phone]   Fax:  [phone]  Physical Therapy Treatment  Patient Details  Name: Stephanie [name] MRN: [id number] Date of Birth: [birth year] Referring Provider: Fuller Canada, MD  Encounter Date: 04/09/2016      PT End of Session - 04/09/16 0913    Visit Number 6   Number of Visits 12   Date for PT Re-Evaluation 04/13/16   Authorization Type BCBS   Authorization Time Period 03/23/16 to 05/08/16   PT Start Time 0901   PT Stop Time 0940   PT Time Calculation (min) 39 min   Equipment Utilized During Treatment Gait belt   Activity Tolerance Patient tolerated treatment well   Behavior During Therapy [facility] for tasks assessed/performed      Past Medical History:  Diagnosis Date  . Diabetes mellitus without complication (HCC)   . GERD (gastroesophageal reflux disease)   . Hypercholesteremia   . PONV (postoperative nausea and vomiting)     Past Surgical History:  Procedure Laterality Date  . ABDOMINAL HYSTERECTOMY    . COLONOSCOPY N/A 12/26/2012   Procedure: COLONOSCOPY;  Surgeon: Corbin Ade, MD;  [facility];  Service: Endoscopy;  Laterality: N/A;  9:15 AM  . TOTAL KNEE ARTHROPLASTY Left 03/04/2016   Procedure: LEFT TOTAL KNEE ARTHROPLASTY;  Surgeon: Vickki Hearing, MD;  Location: [facility];  Service: Orthopedics;  Laterality: Left;  . TUBAL LIGATION      There were no vitals filed for this visit.      Subjective Assessment - 04/09/16 0904    Subjective Pt reports she was stiff after he last session, but it has alleviated today. She was trying to do her exercises yesterday and feels she is doing them without too much difficulty.    Pertinent History GERD, DM   Currently in Pain? No/denies            [facility] PT Assessment - 04/09/16 0001      AROM   Left Knee Flexion 115  110 pre                     [facility] Adult PT  Treatment/Exercise - 04/09/16 0001      Knee/Hip Exercises: Stretches   Active Hamstring Stretch 3 reps;30 seconds   Active Hamstring Stretch Limitations 12" box    Gastroc Stretch Both;3 reps;30 seconds   Gastroc Stretch Limitations slant board      Knee/Hip Exercises: Standing   Forward Step Up 2 sets;Step Height: 6";Other (comment)  2 finger support    Step Down 2 sets;15 reps;Hand Hold: 2;Step Height: 4"   Step Down Limitations (+) hip drop    Wall Squat 3 sets;Other (comment)  7 reps each    Other Standing Knee Exercises forward lunge knee flexion stretch 15x10 sec hold LLE             Balance Exercises - 04/09/16 0924      Balance Exercises: Standing   Standing Eyes Opened Foam/compliant surface;Other reps (comment)  hip marching x10 each, no UE support    SLS 2 reps;30 secs;Intermittent upper extremity support;Eyes open;Foam/compliant surface;Other (comment)  Rt: 20 sec max, Lt: 16 sec max, x2 trials    SLS with Vectors 3 reps;10 secs;Solid surface;Intermittent upper extremity assist   Tandem Gait Forward;2 reps;Foam/compliant surface  CGA           PT Education - 04/09/16 6962  Education provided Yes   Education Details encouraged pt to ice/elevate after sessions to decrease swelling and stiffness; technique with therex    Person(s) Educated Patient   Methods Explanation;Verbal cues   Comprehension Verbalized understanding;Returned demonstration          PT Short Term Goals - 03/23/16 1108      PT SHORT TERM GOAL #1   Title Pt will demo consistency and independence with HEP   Time 3   Period Weeks   Status New     PT SHORT TERM GOAL #2   Title Pt will report no greater than 4/10 pain on VAS to improve tolerance to daily activity.   Time 3   Period Weeks   Status New     PT SHORT TERM GOAL #3   Title Pt will demo improved sit to stand technique throughout the session, evident by equal weight shift and minimal use of UE support, without cues  from the therapist.   Time 3   Period Weeks   Status New     PT SHORT TERM GOAL #4   Title Pt will demo Lt knee ROM of atleast 0 deg ext and 115 deg flexion in order to improve her functional mobility.   Time 3   Period Weeks   Status New     PT SHORT TERM GOAL #5   Title pt will demo improved gait mechanics of equal step length and upright posture for atleast 226 ft without cues from PT.    Time 3   Period Weeks   Status New           PT Long Term Goals - 03/23/16 1109      PT LONG TERM GOAL #1   Title Pt will demo improved functional strength evident by completing 5x sit to stand in atleast 11 sec without UE support and with equal weight shift.   Time 6   Period Weeks   Status New     PT LONG TERM GOAL #2   Title Pt will demo improved functional strength and balance evident by her ability to complete TUG in less than 13 sec without AD.   Time 6   Period Weeks   Status New     PT LONG TERM GOAL #3   Title Pt will report no greater than 2/10 pain during daily activity to improve tolerance to ADLs.   Time 6   Period Weeks   Status New               Plan - 04/09/16 0916    Clinical Impression Statement Today's session focused on progressions of functional strengthening and dynamic balance. Pt is pleased with her progress so far and she continues to make progress with improving balance and strength. Will anticipate d/c at next visit assuming no issues/concerns arise.    Rehab Potential Good   PT Frequency 2x / week   PT Duration 6 weeks   PT Treatment/Interventions ADLs/Self Care Home Management;Aquatic Therapy;Electrical Stimulation;Moist Heat;Neuromuscular re-education;Balance training;Therapeutic exercise;Therapeutic activities;Stair training;Gait training;Patient/family education;Manual techniques;Scar mobilization;Passive range of motion;Taping   PT Next Visit Plan reassessment and possible d/c    PT Home Exercise Plan finalize HEP at next visit    Recommended Other Services none    Consulted and Agree with Plan of Care Patient      Patient will benefit from skilled therapeutic intervention in order to improve the following deficits and impairments:  Abnormal gait, Decreased activity tolerance, Decreased balance, Decreased  range of motion, Decreased mobility, Hypomobility, Increased edema, Impaired sensation, Pain, Increased muscle spasms, Increased fascial restricitons, Improper body mechanics, Postural dysfunction, Impaired flexibility  Visit Diagnosis: Pain in left knee  Stiffness of left knee, not elsewhere classified  Unsteadiness on feet  Localized edema  Other abnormalities of gait and mobility     Problem List Patient Active Problem List   Diagnosis Date Noted  . Osteoarthritis of left knee   . Arthritis of knee, degenerative 02/23/2013  . ACHILLES TENDINITIS 06/04/2009  . ANKLE PAIN, LEFT 12/01/2007    9:44 AM,04/09/16 Marylyn Ishihara PT, DPT [facility] [phone]  [facility] [address] [place], [place], [address] Phone: [phone]   Fax:  [phone]  Name: Stephanie [name] MRN: [id number] Date of Birth: 11-04-1950

## 2016-04-13 ENCOUNTER — Ambulatory Visit (HOSPITAL_COMMUNITY): Payer: BLUE CROSS/BLUE SHIELD

## 2016-04-13 DIAGNOSIS — M25562 Pain in left knee: Secondary | ICD-10-CM | POA: Diagnosis not present

## 2016-04-13 DIAGNOSIS — R2681 Unsteadiness on feet: Secondary | ICD-10-CM

## 2016-04-13 DIAGNOSIS — R600 Localized edema: Secondary | ICD-10-CM

## 2016-04-13 DIAGNOSIS — M25662 Stiffness of left knee, not elsewhere classified: Secondary | ICD-10-CM

## 2016-04-13 DIAGNOSIS — R2689 Other abnormalities of gait and mobility: Secondary | ICD-10-CM

## 2016-04-13 NOTE — Therapy (Addendum)
[place] [address] [place], [place], [address] Phone: [phone]   Fax:  [phone]  Physical Therapy Treatment  Patient Details  Name: Stephanie [name] MRN: [id number] Date of Birth: [birth year] Referring Provider: Fuller CanadaStanley Harrison  Encounter Date: 04/13/2016      PT End of Session - 04/13/16 1121    Visit Number 7   Number of Visits 12   Date for PT Re-Evaluation 05/08/16   Authorization Type BCBS   Authorization Time Period 03/23/16 to 05/08/16   PT Start Time 0946   PT Stop Time 1031   PT Time Calculation (min) 45 min   Activity Tolerance Patient tolerated treatment well   Behavior [facility] Therapy [facility] for tasks assessed/performed      Past Medical History:  Diagnosis Date  . Diabetes mellitus without complication (HCC)   . GERD (gastroesophageal reflux disease)   . Hypercholesteremia   . PONV (postoperative nausea and vomiting)     Past Surgical History:  Procedure Laterality Date  . ABDOMINAL HYSTERECTOMY    . COLONOSCOPY N/A 12/26/2012   Procedure: COLONOSCOPY;  Surgeon: Corbin Adeobert M Rourk, MD;  [facility];  Service: Endoscopy;  Laterality: N/A;  9:15 AM  . TOTAL KNEE ARTHROPLASTY Left 03/04/2016   Procedure: LEFT TOTAL KNEE ARTHROPLASTY;  Surgeon: Vickki HearingStanley E Harrison, MD;  Location: [facility];  Service: Orthopedics;  Laterality: Left;  . TUBAL LIGATION      There were no vitals filed for this visit.      Subjective Assessment - 04/13/16 0948    Subjective Pt reports she is doign well overall, stil working on improving the stiffness in her knee. Pain has not been a huge issue, but it does get achy at night to soem degree.    Pertinent History GERD, DM   Limitations Walking   How long can you sit comfortably? 45-50 minutes (20-30 minutes at evaluation)    How long can you walk comfortably? ~30 minutes; has not attempted further (~20 minutes at evaluation)   Patient Stated Goals improve ROM and  strength    Currently in Pain? No/denies            [facility] - 04/13/16 0001      Assessment   Medical Diagnosis Lt TKR   Referring Provider Fuller CanadaStanley Harrison   Onset Date/Surgical Date 03/04/16   Hand Dominance Right   Next MD Visit 06/01/2016   Prior Therapy 2 weeks HHPT, 3x/week     Balance Screen   Has the patient fallen in the past 6 months No   Has the patient had a decrease in activity level because of a fear of falling?  Yes   Is the patient reluctant to leave their home because of a fear of falling?  Yes  yes when alone     AROM   Left Knee Extension 4   Left Knee Flexion 112  supine     Strength   Right Hip Flexion 5/5  seated   Right Hip Extension 5/5  previously 3+/5   Right Hip ABduction 5/5  seated; 5/5 sidelying   Left Hip Flexion 5/5   Left Hip Extension 5/5  previously 3/5   Left Hip ABduction 5/5  seated; 4/5 sidelying   Right Knee Flexion 5/5  previously 4+/5   Right Knee Extension 5/5   Left Knee Flexion 5/5  previously 4/5   Left Knee Extension 5/5     Transfers   Five time  sit to stand comments  11.12s, hands free equal weight bearing.   14.55s, No UE, weight shift Rt (Eval)      Ambulation/Gait   Ambulation/Gait Yes   Ambulation/Gait Assistance 6: Modified independent (Device/Increase time)   Ambulation Distance (Feet) 1300 Feet   Assistive device Straight cane  pt choice   Gait velocity 1.40m/s                               PT Short Term Goals - 04/13/16 1008      PT SHORT TERM GOAL #1   Title Pt will demo consistency and independence with HEP   Time 3   Period Weeks   Status Achieved     PT SHORT TERM GOAL #2   Title Pt will report no greater than 4/10 pain on VAS to improve tolerance to daily activity.   Time 3   Period Weeks   Status Achieved     PT SHORT TERM GOAL #3   Title Pt will demo improved sit to stand technique throughout the session, evident by equal weight shift and  minimal use of UE support, without cues from the therapist.   Time 3   Period Weeks   Status Achieved     PT SHORT TERM GOAL #4   Title Pt will demo Lt knee ROM of atleast 0 deg ext and 115 deg flexion in order to improve her functional mobility.   Baseline At reassessment, pt is 4-108 degrees.    Time 3   Period Weeks   Status New     PT SHORT TERM GOAL #5   Title pt will demo improved gait mechanics of equal step length and upright posture for atleast 226 ft without cues from PT.    Baseline AMB 1352ft at reassessment   Time 3   Period Weeks   Status Achieved           PT Long Term Goals - 04/13/16 1013      PT LONG TERM GOAL #1   Title Pt will demo improved functional strength evident by completing 5x sit to stand in at least 11 sec without UE support and with equal weight shift.   Baseline 11s at reassessment   Time 6   Period Weeks   Status Achieved     PT LONG TERM GOAL #3   Title Pt will report no greater than 2/10 pain during daily activity to improve tolerance to ADLs.   Time 6   Period Weeks   Status Achieved               Plan - 04/13/16 1122    Clinical Impression Statement Reassessment performed this session. The patient demonstrates completion of all short term goals, and completion of many longterm goals. ROM remains the greatest deficit, which continues to cause some decreased toelrance to prolonged sitting and sleep at night. Gait speed/quality have progressed well, but remain somewhat impaired compared to age matched norm values. The patient will benefit from additional 3 weeks of PT but will be decreased to 1x weekly.    Rehab Potential Good   PT Frequency 1x / week   PT Duration 6 weeks   PT Treatment/Interventions ADLs/Self Care Home Management;Aquatic Therapy;Electrical Stimulation;Moist Heat;Neuromuscular re-education;Balance training;Therapeutic exercise;Therapeutic activities;Stair training;Gait training;Patient/family education;Manual  techniques;Scar mobilization;Passive range of motion;Taping   PT Next Visit Plan review HEP in full, DC items no longer needed.  PT Home Exercise Plan 8/28 --> added lunge on stairs knee flexion stretch; heels hang calf stretch.    Consulted and Agree with Plan of Care Patient      Patient will benefit from skilled therapeutic intervention in order to improve the following deficits and impairments:  Abnormal gait, Decreased activity tolerance, Decreased balance, Decreased range of motion, Decreased mobility, Hypomobility, Increased edema, Impaired sensation, Pain, Increased muscle spasms, Increased fascial restricitons, Improper body mechanics, Postural dysfunction, Impaired flexibility  Visit Diagnosis: Pain in left knee  Stiffness of left knee, not elsewhere classified  Unsteadiness on feet  Localized edema  Other abnormalities of gait and mobility     Problem List Patient Active Problem List   Diagnosis Date Noted  . Osteoarthritis of left knee   . Arthritis of knee, degenerative 02/23/2013  . ACHILLES TENDINITIS 06/04/2009  . ANKLE PAIN, LEFT 12/01/2007    11:33 AM, 04/13/16 Rosamaria Lints, PT, DPT Physical Therapist at [facility] [phone] (office)     [facility] [address] [place], [place], [address] Phone: [phone]   Fax:  [phone]  Name: Stephanie [name] MRN: [id number] Date of Birth: 04/25/1951

## 2016-04-15 ENCOUNTER — Ambulatory Visit (HOSPITAL_COMMUNITY): Payer: BLUE CROSS/BLUE SHIELD | Admitting: Physical Therapy

## 2016-04-15 DIAGNOSIS — R600 Localized edema: Secondary | ICD-10-CM

## 2016-04-15 DIAGNOSIS — R2681 Unsteadiness on feet: Secondary | ICD-10-CM

## 2016-04-15 DIAGNOSIS — M25662 Stiffness of left knee, not elsewhere classified: Secondary | ICD-10-CM

## 2016-04-15 DIAGNOSIS — M25562 Pain in left knee: Secondary | ICD-10-CM

## 2016-04-15 DIAGNOSIS — R2689 Other abnormalities of gait and mobility: Secondary | ICD-10-CM

## 2016-04-15 NOTE — Therapy (Signed)
[facility] [address] [place], [place], [address] Phone: [phone]   Fax:  [phone]  Physical Therapy Treatment  Patient Details  Name: Stephanie [name] MRN: [id number] Date of Birth: [birth year] Referring Provider: Fuller CanadaStanley Harrison  Encounter Date: 04/15/2016      PT End of Session - 04/15/16 0942    Visit Number 8   Number of Visits 12   Date for PT Re-Evaluation 05/08/16   Authorization Type BCBS   Authorization Time Period 03/23/16 to 05/08/16   PT Start Time 0901   PT Stop Time 0939   PT Time Calculation (min) 38 min   Activity Tolerance Patient tolerated treatment well   Behavior During Therapy [facility] for tasks assessed/performed      Past Medical History:  Diagnosis Date  . Diabetes mellitus without complication (HCC)   . GERD (gastroesophageal reflux disease)   . Hypercholesteremia   . PONV (postoperative nausea and vomiting)     Past Surgical History:  Procedure Laterality Date  . ABDOMINAL HYSTERECTOMY    . COLONOSCOPY N/A 12/26/2012   Procedure: COLONOSCOPY;  Surgeon: Corbin Adeobert M Rourk, MD;  [facility];  Service: Endoscopy;  Laterality: N/A;  9:15 AM  . TOTAL KNEE ARTHROPLASTY Left 03/04/2016   Procedure: LEFT TOTAL KNEE ARTHROPLASTY;  Surgeon: Vickki HearingStanley E Harrison, MD;  Location: [facility];  Service: Orthopedics;  Laterality: Left;  . TUBAL LIGATION      There were no vitals filed for this visit.      Subjective Assessment - 04/15/16 0914    Subjective Pt feels she is doing great. She is not using her cane as much anymore and feels that is a huge improvement. She does not have any pain, but she does have stiffness after sitting for a while.   Pertinent History GERD, DM   Limitations Walking   How long can you sit comfortably? 45-50 minutes (20-30 minutes at evaluation)    How long can you walk comfortably? ~30 minutes; has not attempted further (~20 minutes at evaluation)   Patient Stated Goals  improve ROM and strength    Currently in Pain? No/denies            [facility] - 04/15/16 0001      Observation/Other Assessments   Observations LEFS: 67/80  increased difficulty with stairs                     [facility] Adult PT Treatment/Exercise - 04/15/16 0001      Ambulation/Gait   Gait Comments ascend 6" steps with reciprocal pattern and 1 handrail; descend steps with Lt step to pattern and 1 handrail. x3 RT     Knee/Hip Exercises: Stretches   Other Knee/Hip Stretches knee flexion stretch on steps 3x30 sec each      Knee/Hip Exercises: Standing   Step Down 3 sets;15 reps;Step Height: 6";Hand Hold: 1;Left  last set, no UE support    Step Down Limitations verbal cues to improve LLE work    Functional Squat 2 sets;15 reps   Functional Squat Limitations BUE support at counter                 PT Education - 04/15/16 0940    Education provided Yes   Education Details discussed possibility of muscle soreness following today's session; discussed progress and expected improvments in ROM considering RLE is equal to Lt at this time. Discussed results of LEFS and specifica areas of impairment;  encouraged pt return to [facility] and implement walking program; HEP update    Person(s) Educated Patient   Methods Explanation;Demonstration;Handout   Comprehension Verbalized understanding;Returned demonstration          PT Short Term Goals - 04/13/16 1008      PT SHORT TERM GOAL #1   Title Pt will demo consistency and independence with HEP   Time 3   Period Weeks   Status Achieved     PT SHORT TERM GOAL #2   Title Pt will report no greater than 4/10 pain on VAS to improve tolerance to daily activity.   Time 3   Period Weeks   Status Achieved     PT SHORT TERM GOAL #3   Title Pt will demo improved sit to stand technique throughout the session, evident by equal weight shift and minimal use of UE support, without cues from the therapist.   Time 3    Period Weeks   Status Achieved     PT SHORT TERM GOAL #4   Title Pt will demo Lt knee ROM of atleast 0 deg ext and 115 deg flexion in order to improve her functional mobility.   Baseline At reassessment, pt is 4-108 degrees.    Time 3   Period Weeks   Status New     PT SHORT TERM GOAL #5   Title pt will demo improved gait mechanics of equal step length and upright posture for atleast 226 ft without cues from PT.    Baseline AMB 1353ft at reassessment   Time 3   Period Weeks   Status Achieved           PT Long Term Goals - 04/13/16 1013      PT LONG TERM GOAL #1   Title Pt will demo improved functional strength evident by completing 5x sit to stand in at least 11 sec without UE support and with equal weight shift.   Baseline 11s at reassessment   Time 6   Period Weeks   Status Achieved     PT LONG TERM GOAL #3   Title Pt will report no greater than 2/10 pain during daily activity to improve tolerance to ADLs.   Time 6   Period Weeks   Status Achieved               Plan - 04/15/16 9147    Clinical Impression Statement Today's session focused on therex to improve functional strength. Pt with continued preference to lead with Lt step to pattern when descending steps and reports limited ability to perform activities at home requiring she carry laundry basket downstairs. She was able to perform step downs and squat activity with verbal cues for technique and reporting muscle fatigue by the end of the session. Discussed importance of adjusting technique with stairs, etc. to allow her to strengthen her LLE. She verbalized understanding at this time.   Rehab Potential Good   PT Frequency 1x / week   PT Duration 6 weeks   PT Treatment/Interventions ADLs/Self Care Home Management;Aquatic Therapy;Electrical Stimulation;Moist Heat;Neuromuscular re-education;Balance training;Therapeutic exercise;Therapeutic activities;Stair training;Gait training;Patient/family education;Manual  techniques;Scar mobilization;Passive range of motion;Taping   PT Next Visit Plan review HEP in full, DC items no longer needed.    PT Home Exercise Plan 8/28 --> added lunge on stairs knee flexion stretch; heels hang calf stretch.    Consulted and Agree with Plan of Care Patient      Patient will benefit from skilled therapeutic intervention in  order to improve the following deficits and impairments:     Visit Diagnosis: Stiffness of left knee, not elsewhere classified  Unsteadiness on feet  Pain in left knee  Localized edema  Other abnormalities of gait and mobility     Problem List Patient Active Problem List   Diagnosis Date Noted  . Osteoarthritis of left knee   . Arthritis of knee, degenerative 02/23/2013  . ACHILLES TENDINITIS 06/04/2009  . ANKLE PAIN, LEFT 12/01/2007    10:51 AM,04/15/16 Marylyn Ishihara PT, DPT [facility] [phone]  [facility] [address] [place], [place], [address] Phone: [phone]   Fax:  [phone]  Name: Stephanie [name] MRN: [id number] Date of Birth: 1950-10-11

## 2016-04-21 ENCOUNTER — Ambulatory Visit (HOSPITAL_COMMUNITY): Payer: BLUE CROSS/BLUE SHIELD | Attending: Orthopedic Surgery | Admitting: Physical Therapy

## 2016-04-21 DIAGNOSIS — R600 Localized edema: Secondary | ICD-10-CM | POA: Diagnosis present

## 2016-04-21 DIAGNOSIS — R2681 Unsteadiness on feet: Secondary | ICD-10-CM | POA: Diagnosis present

## 2016-04-21 DIAGNOSIS — M25562 Pain in left knee: Secondary | ICD-10-CM | POA: Diagnosis present

## 2016-04-21 DIAGNOSIS — R2689 Other abnormalities of gait and mobility: Secondary | ICD-10-CM | POA: Diagnosis present

## 2016-04-21 DIAGNOSIS — M25662 Stiffness of left knee, not elsewhere classified: Secondary | ICD-10-CM

## 2016-04-21 NOTE — Therapy (Addendum)
[facility] [address] [place], [place], [address] Phone: [phone]   Fax:  [phone]  Physical Therapy Treatment/Discharge  Patient Details  Name: Stephanie [name] MRN: [id number] Date of Birth: 1951-06-02 Referring Provider: Arther Abbott  Encounter Date: 04/21/2016      PT End of Session - 04/21/16 0954    Visit Number 9   Number of Visits 12   Date for PT Re-Evaluation 05/08/16   Authorization Type [employer]   Authorization Time Period 03/23/16 to 05/08/16   PT Start Time 0945   PT Stop Time 1025   PT Time Calculation (min) 40 min   Activity Tolerance Patient tolerated treatment well   Behavior During Therapy [facility] for tasks assessed/performed      Past Medical History:  Diagnosis Date  . Diabetes mellitus without complication ([place])   . GERD (gastroesophageal reflux disease)   . Hypercholesteremia   . PONV (postoperative nausea and vomiting)     Past Surgical History:  Procedure Laterality Date  . ABDOMINAL HYSTERECTOMY    . COLONOSCOPY N/A 12/26/2012   Procedure: COLONOSCOPY;  Surgeon: Daneil Dolin, MD;  Location: [facility];  Service: Endoscopy;  Laterality: N/A;  9:15 AM  . TOTAL KNEE ARTHROPLASTY Left 03/04/2016   Procedure: LEFT TOTAL KNEE ARTHROPLASTY;  Surgeon: Carole Civil, MD;  Location: [facility];  Service: Orthopedics;  Laterality: Left;  . TUBAL LIGATION      There were no vitals filed for this visit.      Subjective Assessment - 04/21/16 0949    Subjective Pt reports things are going well. She tried her exercises and feels things went well. She continues to have stiffness along the front of her knee when she sits for a while. No other complaints.    Pertinent History GERD, DM   Limitations Walking   How long can you sit comfortably? 45-50 minutes (20-30 minutes at evaluation)    How long can you walk comfortably? ~30 minutes; has not attempted further (~20 minutes at evaluation)   Patient  Stated Goals improve ROM and strength    Currently in Pain? No/denies                         [facility] Adult PT Treatment/Exercise - 04/21/16 0001      Ambulation/Gait   Gait Comments Ascend 6" steps with reciprocal pattern and 1 handrail; descend steps with Lt step to pattern and 1 handrail. x5 trials. While holding yellow weighted ball, ascend with reciprocal pattern and descend with Rt step to pattern x3 RT.     Knee/Hip Exercises: Standing   Heel Raises 2 sets;Both;20 reps   Hip Flexion Left;2 sets;15 reps;Knee bent;Other (comment)  standing on foam pad, No UE support    Forward Step Up 2 sets;20 reps;Hand Hold: 1;Step Height: 4";Left  with foam pad    Step Down Left;2 sets;20 reps;Hand Hold: 1;Step Height: 6"             Balance Exercises - 04/21/16 1008      Balance Exercises: Standing   SLS with Vectors 5 reps;10 secs;Solid surface;Intermittent upper extremity assist  each LE   Tandem Gait Forward;Intermittent upper extremity support;Other (comment)  x3 RT, CGA; x3RT holding weighted ball, CGA             PT Short Term Goals - 04/21/16 1027      PT SHORT TERM GOAL #1   Title Pt will  demo consistency and independence with HEP   Time 3   Period Weeks   Status Achieved     PT SHORT TERM GOAL #2   Title Pt will report no greater than 4/10 pain on VAS to improve tolerance to daily activity.   Time 3   Period Weeks   Status Achieved     PT SHORT TERM GOAL #3   Title Pt will demo improved sit to stand technique throughout the session, evident by equal weight shift and minimal use of UE support, without cues from the therapist.   Time 3   Period Weeks   Status Achieved     PT SHORT TERM GOAL #4   Title Pt will demo Lt knee ROM of atleast 0 deg ext and 115 deg flexion in order to improve her functional mobility.   Baseline At reassessment, pt is 4-108 degrees.; 112 deg knee flexion at last reassess    Time 3   Period Weeks   Status Partially  Met     PT SHORT TERM GOAL #5   Title pt will demo improved gait mechanics of equal step length and upright posture for atleast 226 ft without cues from PT.    Baseline AMB 1322f at reassessment   Time 3   Period Weeks   Status Achieved           PT Long Term Goals - 04/21/16 1029      PT LONG TERM GOAL #1   Title Pt will demo improved functional strength evident by completing 5x sit to stand in at least 11 sec without UE support and with equal weight shift.   Baseline 11s at reassessment   Time 6   Period Weeks   Status Achieved     PT LONG TERM GOAL #3   Title Pt will report no greater than 2/10 pain during daily activity to improve tolerance to ADLs.   Time 6   Period Weeks   Status Achieved               Plan - 04/21/16 1027    Clinical Impression Statement [name] made great progress since beginning PT several weeks ago having met all but 1 of her goals. She was reassessed 2 visits ago demonstrating improved BLE strength to 5/5 MMT and good performance on all functional tests. She currently demonstrates good balance evident by SLS up to 20 sec on each LE and improved functional strength evident by her ability to ascend/descend steps with reciprocal pattern and no more than 1 handrail assistance at times. She demonstrates minimal limitations in knee ROM, she is WNL of her RLE ROM and should continue to improve with HEP adherence. She demonstrates independence with her HEP and is able to perform all activities without reports of pain. She no longer requires skilled PT to address her impairments and is being discharged having met goals and being pleased with level of progress.    Rehab Potential Good   PT Frequency 1x / week   PT Duration 6 weeks   PT Treatment/Interventions ADLs/Self Care Home Management;Aquatic Therapy;Electrical Stimulation;Moist Heat;Neuromuscular re-education;Balance training;Therapeutic exercise;Therapeutic activities;Stair training;Gait  training;Patient/family education;Manual techniques;Scar mobilization;Passive range of motion;Taping   PT Next Visit Plan d/c    PT Home Exercise Plan knee flexion stretch on step, squat at counter, heel raises, Lateral step down   Recommended Other Services none    Consulted and Agree with Plan of Care Patient      Patient will  benefit from skilled therapeutic intervention in order to improve the following deficits and impairments:  Abnormal gait, Decreased activity tolerance, Decreased balance, Decreased range of motion, Decreased mobility, Hypomobility, Increased edema, Impaired sensation, Pain, Increased muscle spasms, Increased fascial restricitons, Improper body mechanics, Postural dysfunction, Impaired flexibility  Visit Diagnosis: Stiffness of left knee, not elsewhere classified  Unsteadiness on feet  Pain in left knee  Localized edema  Other abnormalities of gait and mobility     Problem List Patient Active Problem List   Diagnosis Date Noted  . Osteoarthritis of left knee   . Arthritis of knee, degenerative 02/23/2013  . ACHILLES TENDINITIS 06/04/2009  . ANKLE PAIN, LEFT 12/01/2007    PHYSICAL THERAPY DISCHARGE SUMMARY  Visits from Start of Care: 9  Current functional level related to goals / functional outcomes: Improved knee ROM, 5/5 BLE strength, good performance on functional testing (5x sit to stand, TUG), good single leg balance up to 20 sec each. See clinical impression above for more details    Remaining deficits: Knee flexion ROM (112 deg) which should continue to improve with HEP adherence. This is fairly WNL of her RLE   Education / Equipment: Advanced home management program  Plan: Patient agrees to discharge.  Patient goals were met. Patient is being discharged due to meeting the stated rehab goals.  ?????          12:40 PM,04/21/16 Elly Modena PT, DPT [facility] [place] [address] [place], [place], [address] Phone: [phone]   Fax:  [phone]  Name: Stephanie [name] MRN: [id number] Date of Birth: 30-Aug-1950   *Addendum to close episode of care  12:51 PM,04/21/16 Elly Modena PT, DPT [facility] [phone]

## 2016-04-23 ENCOUNTER — Encounter (HOSPITAL_COMMUNITY): Payer: BLUE CROSS/BLUE SHIELD

## 2016-04-28 ENCOUNTER — Encounter (HOSPITAL_COMMUNITY): Payer: BLUE CROSS/BLUE SHIELD | Admitting: Physical Therapy

## 2016-04-30 ENCOUNTER — Encounter (HOSPITAL_COMMUNITY): Payer: BLUE CROSS/BLUE SHIELD | Admitting: Physical Therapy

## 2016-06-01 ENCOUNTER — Ambulatory Visit (INDEPENDENT_AMBULATORY_CARE_PROVIDER_SITE_OTHER): Payer: Medicare Other | Admitting: Orthopedic Surgery

## 2016-06-01 ENCOUNTER — Encounter: Payer: Self-pay | Admitting: Orthopedic Surgery

## 2016-06-01 VITALS — BP 107/64 | HR 119 | Wt 183.0 lb

## 2016-06-01 DIAGNOSIS — Z96652 Presence of left artificial knee joint: Secondary | ICD-10-CM

## 2016-06-01 DIAGNOSIS — Z4789 Encounter for other orthopedic aftercare: Secondary | ICD-10-CM

## 2016-06-01 NOTE — Progress Notes (Signed)
Patient ID: Stephanie [name], female   DOB: 06-29-1951, 65 y.o.   MRN: [id number]  Post op visit   Chief Complaint  Patient presents with  . Follow-up    left total knee replacement 03/04/16    BP 107/64   Pulse (!) 119   Wt 183 lb (83 kg)   BMI 33.47 kg/m   Postop 3 months post left total knee. Doing well. Can use occasionally. Came in today walking without supportive devices. Knee extension is full flexion is 115 knee is stable  Follow-up 3 months

## 2016-07-29 DIAGNOSIS — H2513 Age-related nuclear cataract, bilateral: Secondary | ICD-10-CM | POA: Diagnosis not present

## 2016-07-29 DIAGNOSIS — H40023 Open angle with borderline findings, high risk, bilateral: Secondary | ICD-10-CM | POA: Diagnosis not present

## 2016-08-17 HISTORY — PX: BREAST BIOPSY: SHX20

## 2016-08-24 DIAGNOSIS — E119 Type 2 diabetes mellitus without complications: Secondary | ICD-10-CM | POA: Diagnosis not present

## 2016-08-24 DIAGNOSIS — Z79899 Other long term (current) drug therapy: Secondary | ICD-10-CM | POA: Diagnosis not present

## 2016-08-24 DIAGNOSIS — R945 Abnormal results of liver function studies: Secondary | ICD-10-CM | POA: Diagnosis not present

## 2016-08-31 ENCOUNTER — Encounter: Payer: Self-pay | Admitting: Orthopedic Surgery

## 2016-08-31 ENCOUNTER — Ambulatory Visit (INDEPENDENT_AMBULATORY_CARE_PROVIDER_SITE_OTHER): Payer: Medicare Other | Admitting: Orthopedic Surgery

## 2016-08-31 DIAGNOSIS — Z96652 Presence of left artificial knee joint: Secondary | ICD-10-CM

## 2016-08-31 MED ORDER — CEPHALEXIN 500 MG PO CAPS: ORAL_CAPSULE | ORAL | 3 refills | Status: DC

## 2016-08-31 NOTE — Progress Notes (Signed)
Patient ID: Marcelyn BruinsGwendolyn M Kohut, female   DOB: 1951-01-27, 66 y.o.   MRN: [id number]  Chief Complaint  Patient presents with  . Follow-up    LEFT TKA, DOS 03/04/16    HPI Marcelyn BruinsGwendolyn M Dolbow is a 66 y.o. female.   HPI six-month follow-up after left total knee no complaints  Review of Systems Review of Systems   No numbness and catching locking giving way  Physical Exam  Left knee incision well-healed good quadriceps strength knee is stable in the sagittal and coronal plane flexion is 115 there is no swelling or tenderness in the knee neurovascular exam is intact   MEDICAL DECISION MAKING  DATA     DIAGNOSIS  Encounter Diagnosis  Name Primary?  . Status post total left knee replacement Yes     PLAN(RISK)    Follow-up for annual knee replacement x-ray in July

## 2016-09-01 DIAGNOSIS — E119 Type 2 diabetes mellitus without complications: Secondary | ICD-10-CM | POA: Diagnosis not present

## 2016-09-01 DIAGNOSIS — Z23 Encounter for immunization: Secondary | ICD-10-CM | POA: Diagnosis not present

## 2016-09-01 DIAGNOSIS — K760 Fatty (change of) liver, not elsewhere classified: Secondary | ICD-10-CM | POA: Diagnosis not present

## 2016-09-01 DIAGNOSIS — Z6834 Body mass index (BMI) 34.0-34.9, adult: Secondary | ICD-10-CM | POA: Diagnosis not present

## 2016-11-23 ENCOUNTER — Other Ambulatory Visit (HOSPITAL_COMMUNITY): Payer: Self-pay | Admitting: Internal Medicine

## 2016-11-23 DIAGNOSIS — Z1231 Encounter for screening mammogram for malignant neoplasm of breast: Secondary | ICD-10-CM

## 2016-12-02 DIAGNOSIS — E119 Type 2 diabetes mellitus without complications: Secondary | ICD-10-CM | POA: Diagnosis not present

## 2016-12-04 DIAGNOSIS — E119 Type 2 diabetes mellitus without complications: Secondary | ICD-10-CM | POA: Diagnosis not present

## 2016-12-11 DIAGNOSIS — E119 Type 2 diabetes mellitus without complications: Secondary | ICD-10-CM | POA: Diagnosis not present

## 2016-12-18 DIAGNOSIS — E119 Type 2 diabetes mellitus without complications: Secondary | ICD-10-CM | POA: Diagnosis not present

## 2016-12-25 ENCOUNTER — Ambulatory Visit (HOSPITAL_COMMUNITY)
Admission: RE | Admit: 2016-12-25 | Discharge: 2016-12-25 | Disposition: A | Discharge status: Home / Self Care | Payer: Medicare Other | Source: Ambulatory Visit | Attending: Internal Medicine | Admitting: Internal Medicine

## 2016-12-25 DIAGNOSIS — R928 Other abnormal and inconclusive findings on diagnostic imaging of breast: Secondary | ICD-10-CM | POA: Insufficient documentation

## 2016-12-25 DIAGNOSIS — E119 Type 2 diabetes mellitus without complications: Secondary | ICD-10-CM | POA: Diagnosis not present

## 2016-12-25 DIAGNOSIS — R921 Mammographic calcification found on diagnostic imaging of breast: Secondary | ICD-10-CM | POA: Insufficient documentation

## 2016-12-25 DIAGNOSIS — Z1231 Encounter for screening mammogram for malignant neoplasm of breast: Secondary | ICD-10-CM | POA: Diagnosis not present

## 2016-12-28 ENCOUNTER — Other Ambulatory Visit (HOSPITAL_COMMUNITY): Payer: Self-pay | Admitting: Internal Medicine

## 2016-12-28 DIAGNOSIS — R52 Pain, unspecified: Secondary | ICD-10-CM

## 2016-12-28 DIAGNOSIS — R921 Mammographic calcification found on diagnostic imaging of breast: Secondary | ICD-10-CM

## 2016-12-28 DIAGNOSIS — Z1239 Encounter for other screening for malignant neoplasm of breast: Secondary | ICD-10-CM

## 2016-12-29 ENCOUNTER — Other Ambulatory Visit: Payer: Self-pay | Admitting: Internal Medicine

## 2016-12-29 ENCOUNTER — Ambulatory Visit (HOSPITAL_COMMUNITY)
Admission: RE | Admit: 2016-12-29 | Discharge: 2016-12-29 | Disposition: A | Discharge status: Home / Self Care | Payer: Medicare Other | Source: Ambulatory Visit | Attending: Internal Medicine | Admitting: Internal Medicine

## 2016-12-29 DIAGNOSIS — R921 Mammographic calcification found on diagnostic imaging of breast: Secondary | ICD-10-CM

## 2017-01-01 ENCOUNTER — Ambulatory Visit
Admission: RE | Admit: 2017-01-01 | Discharge: 2017-01-01 | Disposition: A | Discharge status: Home / Self Care | Payer: Medicare Other | Source: Ambulatory Visit | Attending: Internal Medicine | Admitting: Internal Medicine

## 2017-01-01 DIAGNOSIS — R921 Mammographic calcification found on diagnostic imaging of breast: Secondary | ICD-10-CM

## 2017-01-01 DIAGNOSIS — E119 Type 2 diabetes mellitus without complications: Secondary | ICD-10-CM | POA: Diagnosis not present

## 2017-01-01 DIAGNOSIS — N6321 Unspecified lump in the left breast, upper outer quadrant: Secondary | ICD-10-CM | POA: Diagnosis not present

## 2017-01-08 DIAGNOSIS — E119 Type 2 diabetes mellitus without complications: Secondary | ICD-10-CM | POA: Diagnosis not present

## 2017-01-12 DIAGNOSIS — E119 Type 2 diabetes mellitus without complications: Secondary | ICD-10-CM | POA: Diagnosis not present

## 2017-01-22 DIAGNOSIS — E119 Type 2 diabetes mellitus without complications: Secondary | ICD-10-CM | POA: Diagnosis not present

## 2017-01-22 DIAGNOSIS — E785 Hyperlipidemia, unspecified: Secondary | ICD-10-CM | POA: Diagnosis not present

## 2017-01-22 DIAGNOSIS — Z79899 Other long term (current) drug therapy: Secondary | ICD-10-CM | POA: Diagnosis not present

## 2017-01-22 DIAGNOSIS — E039 Hypothyroidism, unspecified: Secondary | ICD-10-CM | POA: Diagnosis not present

## 2017-01-22 DIAGNOSIS — K219 Gastro-esophageal reflux disease without esophagitis: Secondary | ICD-10-CM | POA: Diagnosis not present

## 2017-01-22 DIAGNOSIS — I10 Essential (primary) hypertension: Secondary | ICD-10-CM | POA: Diagnosis not present

## 2017-01-27 DIAGNOSIS — H40023 Open angle with borderline findings, high risk, bilateral: Secondary | ICD-10-CM | POA: Diagnosis not present

## 2017-01-27 DIAGNOSIS — E119 Type 2 diabetes mellitus without complications: Secondary | ICD-10-CM | POA: Diagnosis not present

## 2017-01-27 DIAGNOSIS — H2513 Age-related nuclear cataract, bilateral: Secondary | ICD-10-CM | POA: Diagnosis not present

## 2017-01-29 DIAGNOSIS — Z6835 Body mass index (BMI) 35.0-35.9, adult: Secondary | ICD-10-CM | POA: Diagnosis not present

## 2017-01-29 DIAGNOSIS — I10 Essential (primary) hypertension: Secondary | ICD-10-CM | POA: Diagnosis not present

## 2017-01-29 DIAGNOSIS — E119 Type 2 diabetes mellitus without complications: Secondary | ICD-10-CM | POA: Diagnosis not present

## 2017-01-29 DIAGNOSIS — E785 Hyperlipidemia, unspecified: Secondary | ICD-10-CM | POA: Diagnosis not present

## 2017-02-19 ENCOUNTER — Ambulatory Visit (INDEPENDENT_AMBULATORY_CARE_PROVIDER_SITE_OTHER): Payer: Medicare Other

## 2017-02-19 ENCOUNTER — Ambulatory Visit (INDEPENDENT_AMBULATORY_CARE_PROVIDER_SITE_OTHER): Payer: Medicare Other | Admitting: Orthopedic Surgery

## 2017-02-19 DIAGNOSIS — Z96652 Presence of left artificial knee joint: Secondary | ICD-10-CM | POA: Diagnosis not present

## 2017-02-19 MED ORDER — CEPHALEXIN 500 MG PO CAPS: ORAL_CAPSULE | ORAL | 5 refills | Status: DC

## 2017-02-19 NOTE — Progress Notes (Signed)
ANNUAL FOLLOW UP FOR  left TKA   Chief Complaint  Patient presents with  . Follow-up    Recheck on left total knee replacement, DOS 03-04-16.     HPI: The patient is here for the annual  follow-up x-ray for knee replacement   Review of Systems  Constitutional: Negative for chills and fever.  Skin: Negative.       Examination of the left KNEE   Inspection shows : incision healed nicely without erythema, no tenderness no swelling  Range of motion total range of motion is 120  Stability the knee is stable anterior to posterior as well as medial to lateral  Strength quadriceps strength is normal  Skin no erythema around the skin incision  Cardiovascular NO EDEMA    Medical decision-making section  X-rays ordered with the following personal interpretation  Normal alignment without loosening   Diagnosis  Encounter Diagnosis  Name Primary?  . Status post left knee replacement Yes     Plan follow-up 1 year repeat x-rays

## 2017-02-26 ENCOUNTER — Ambulatory Visit: Payer: Medicare Other | Admitting: Orthopedic Surgery

## 2017-05-27 DIAGNOSIS — E119 Type 2 diabetes mellitus without complications: Secondary | ICD-10-CM | POA: Diagnosis not present

## 2017-06-03 DIAGNOSIS — E119 Type 2 diabetes mellitus without complications: Secondary | ICD-10-CM | POA: Diagnosis not present

## 2017-06-03 DIAGNOSIS — Z23 Encounter for immunization: Secondary | ICD-10-CM | POA: Diagnosis not present

## 2017-06-03 DIAGNOSIS — I10 Essential (primary) hypertension: Secondary | ICD-10-CM | POA: Diagnosis not present

## 2017-08-02 DIAGNOSIS — H40023 Open angle with borderline findings, high risk, bilateral: Secondary | ICD-10-CM | POA: Diagnosis not present

## 2017-09-13 DIAGNOSIS — E119 Type 2 diabetes mellitus without complications: Secondary | ICD-10-CM | POA: Diagnosis not present

## 2017-09-20 DIAGNOSIS — I10 Essential (primary) hypertension: Secondary | ICD-10-CM | POA: Diagnosis not present

## 2017-09-20 DIAGNOSIS — Z23 Encounter for immunization: Secondary | ICD-10-CM | POA: Diagnosis not present

## 2017-09-20 DIAGNOSIS — E119 Type 2 diabetes mellitus without complications: Secondary | ICD-10-CM | POA: Diagnosis not present

## 2017-09-20 DIAGNOSIS — Z6838 Body mass index (BMI) 38.0-38.9, adult: Secondary | ICD-10-CM | POA: Diagnosis not present

## 2017-11-18 ENCOUNTER — Other Ambulatory Visit (HOSPITAL_COMMUNITY): Payer: Self-pay | Admitting: Internal Medicine

## 2017-11-18 DIAGNOSIS — Z1231 Encounter for screening mammogram for malignant neoplasm of breast: Secondary | ICD-10-CM

## 2017-12-21 IMAGING — CR DG KNEE 1-2V PORT*L*
2 series · 2 of 2 positions shown · non-contrast
Comparison: 01/20/2016

CLINICAL DATA: Status post left knee arthroplasty.

EXAM:
PORTABLE LEFT KNEE - 1-2 VIEW

[ap]
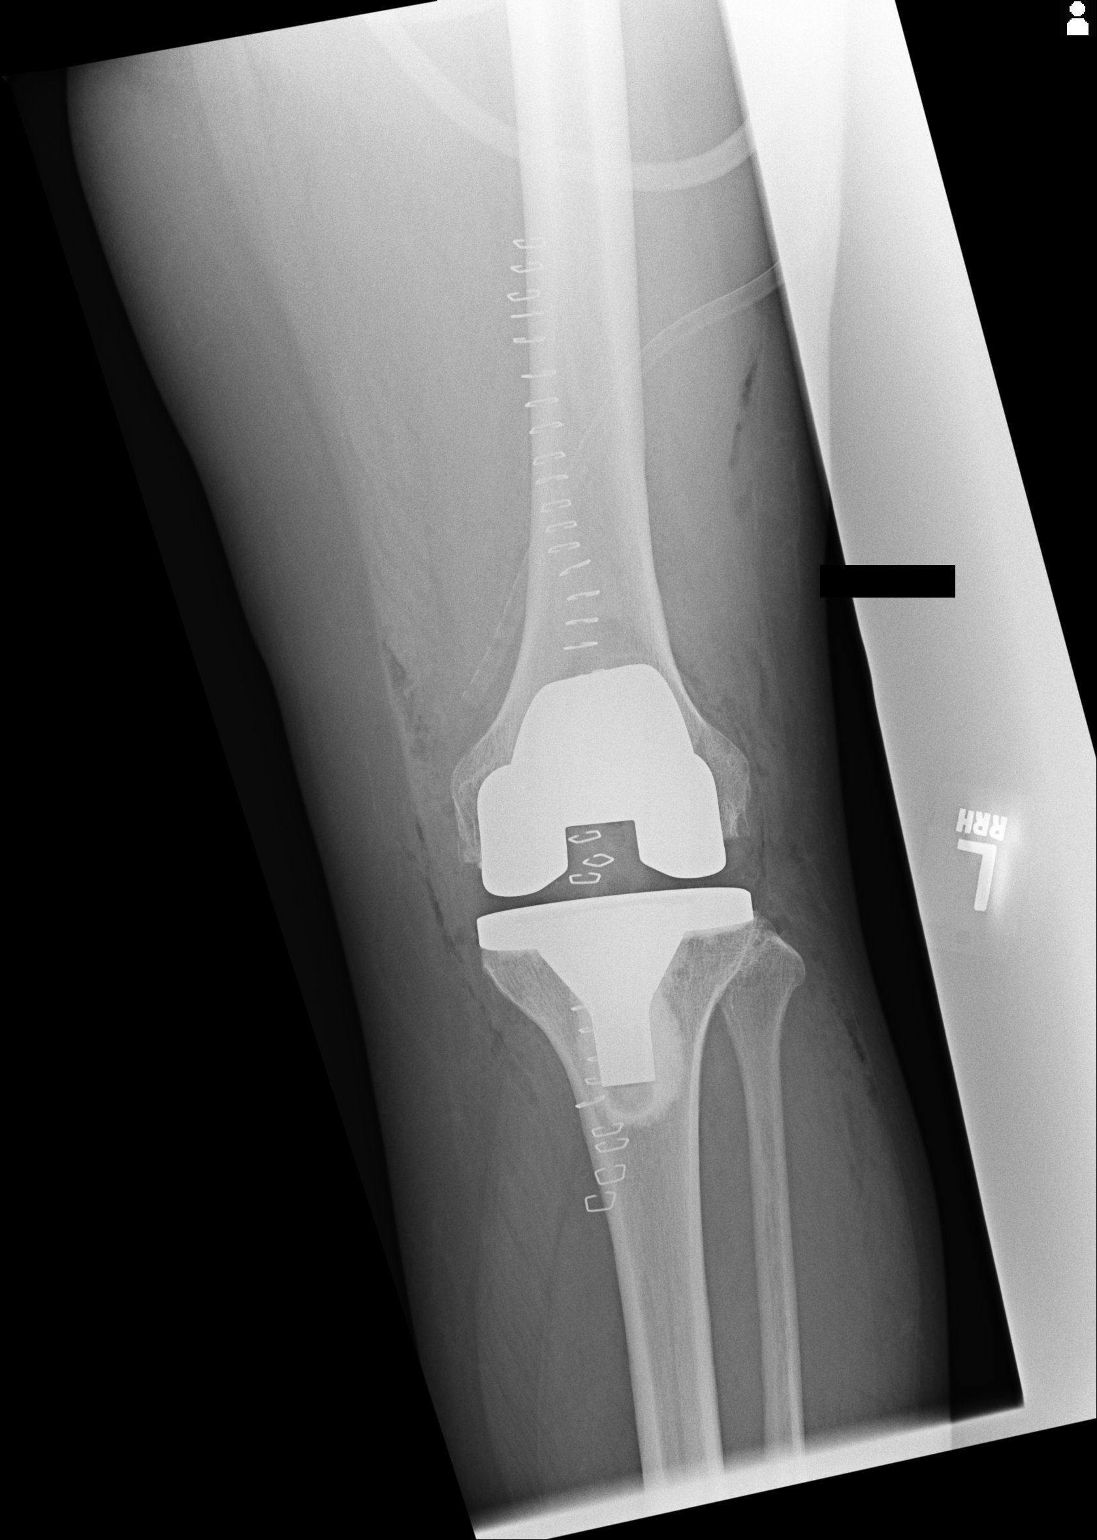

[lat]
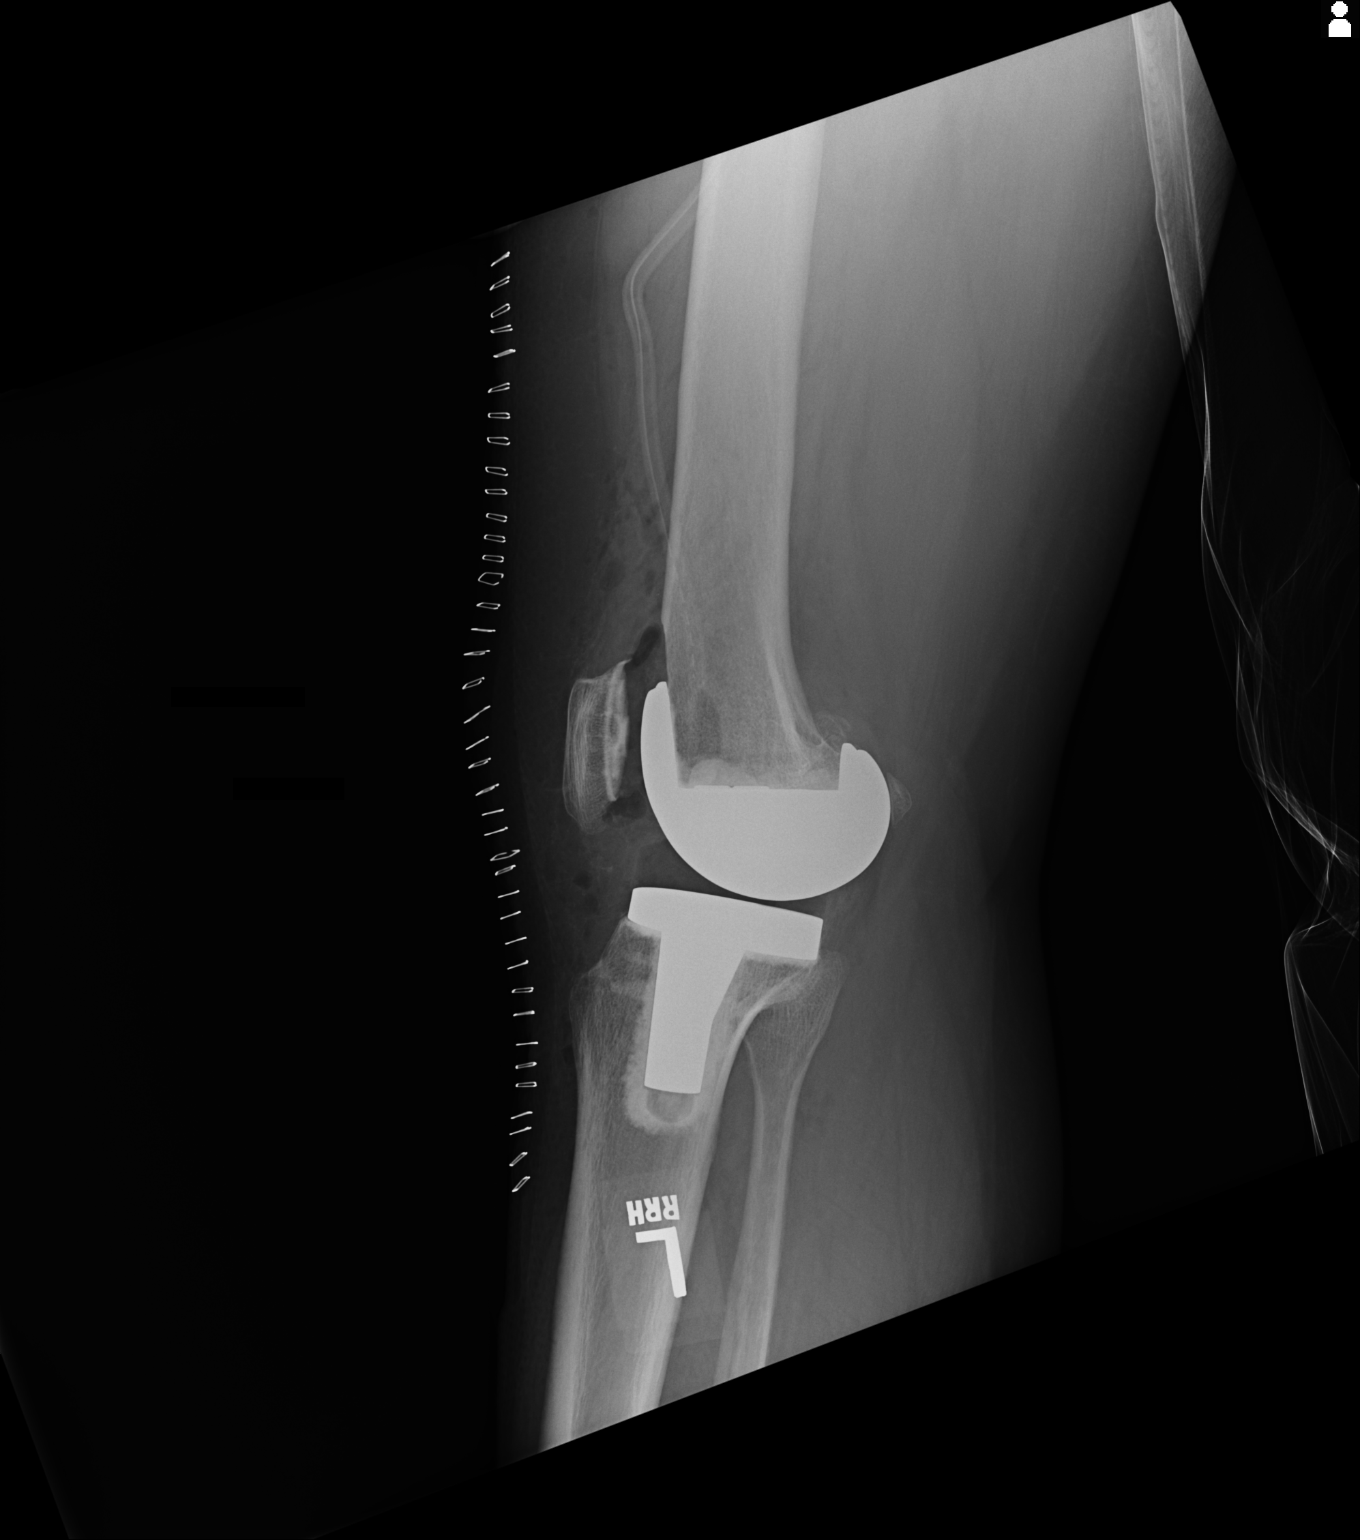

[2 of 2 positions shown; findings below may reference images not displayed]

FINDINGS: Frontal and lateral projection show normal alignment of a total knee
arthroplasty. Suprapatellar surgical drain present. Hardware shows
normal appearance without evidence of surrounding fracture or
abnormal lucency.
IMPRESSION: Normal alignment and radiographic appearance status post total left
knee arthroplasty.

## 2017-12-27 ENCOUNTER — Encounter (HOSPITAL_COMMUNITY): Payer: Self-pay

## 2017-12-27 ENCOUNTER — Ambulatory Visit (HOSPITAL_COMMUNITY)
Admission: RE | Admit: 2017-12-27 | Discharge: 2017-12-27 | Disposition: A | Discharge status: Home / Self Care | Payer: Medicare HMO | Source: Ambulatory Visit | Attending: Internal Medicine | Admitting: Internal Medicine

## 2017-12-27 DIAGNOSIS — Z1231 Encounter for screening mammogram for malignant neoplasm of breast: Secondary | ICD-10-CM | POA: Diagnosis not present

## 2018-01-28 DIAGNOSIS — K219 Gastro-esophageal reflux disease without esophagitis: Secondary | ICD-10-CM | POA: Diagnosis not present

## 2018-01-28 DIAGNOSIS — E039 Hypothyroidism, unspecified: Secondary | ICD-10-CM | POA: Diagnosis not present

## 2018-01-28 DIAGNOSIS — M1990 Unspecified osteoarthritis, unspecified site: Secondary | ICD-10-CM | POA: Diagnosis not present

## 2018-01-28 DIAGNOSIS — E785 Hyperlipidemia, unspecified: Secondary | ICD-10-CM | POA: Diagnosis not present

## 2018-01-28 DIAGNOSIS — I10 Essential (primary) hypertension: Secondary | ICD-10-CM | POA: Diagnosis not present

## 2018-01-28 DIAGNOSIS — E119 Type 2 diabetes mellitus without complications: Secondary | ICD-10-CM | POA: Diagnosis not present

## 2018-01-28 DIAGNOSIS — Z79899 Other long term (current) drug therapy: Secondary | ICD-10-CM | POA: Diagnosis not present

## 2018-02-02 DIAGNOSIS — H2513 Age-related nuclear cataract, bilateral: Secondary | ICD-10-CM | POA: Diagnosis not present

## 2018-02-02 DIAGNOSIS — H401134 Primary open-angle glaucoma, bilateral, indeterminate stage: Secondary | ICD-10-CM | POA: Diagnosis not present

## 2018-02-04 DIAGNOSIS — I10 Essential (primary) hypertension: Secondary | ICD-10-CM | POA: Diagnosis not present

## 2018-02-04 DIAGNOSIS — Z0001 Encounter for general adult medical examination with abnormal findings: Secondary | ICD-10-CM | POA: Diagnosis not present

## 2018-02-04 DIAGNOSIS — R000 Tachycardia, unspecified: Secondary | ICD-10-CM | POA: Diagnosis not present

## 2018-02-04 DIAGNOSIS — E119 Type 2 diabetes mellitus without complications: Secondary | ICD-10-CM | POA: Diagnosis not present

## 2018-02-05 IMAGING — US US ABDOMEN LIMITED
1 series · 14 of 25 positions shown · non-contrast
Comparison: None in PACs

CLINICAL DATA: Elevated liver enzymes

EXAM:
US ABDOMEN LIMITED - RIGHT UPPER QUADRANT

[Series 1: us abdomen limited · 0.22mm/px · 14 of 38 slices shown]
[im 1/38]
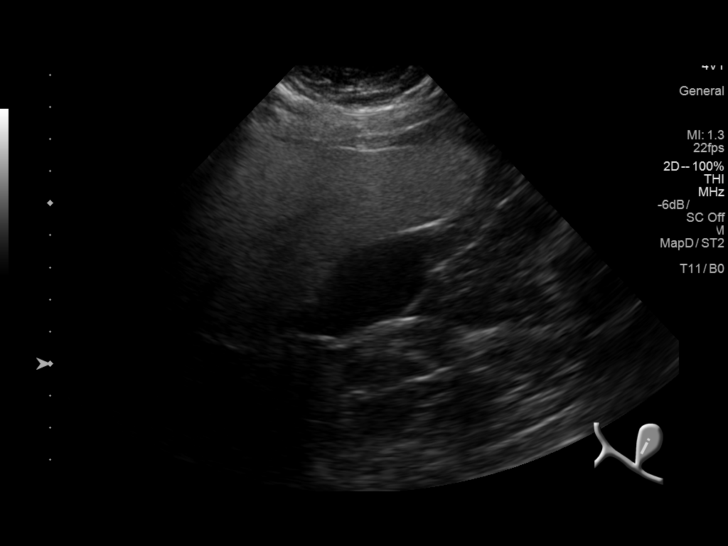
[im 4/38]
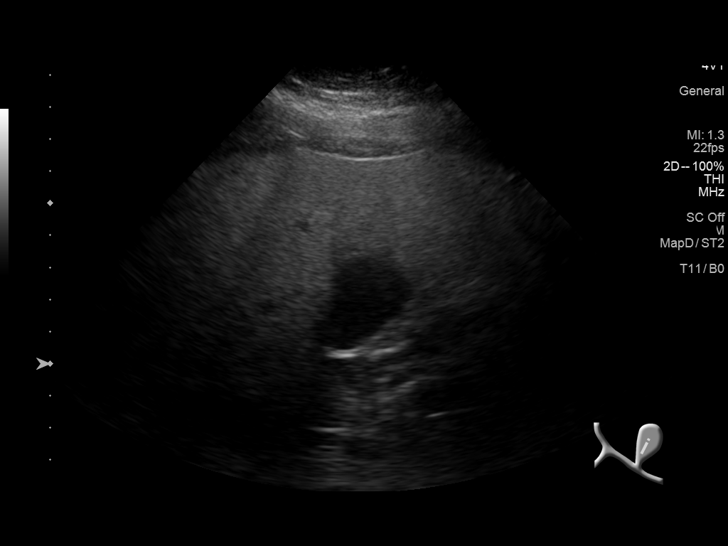
[im 7/38]
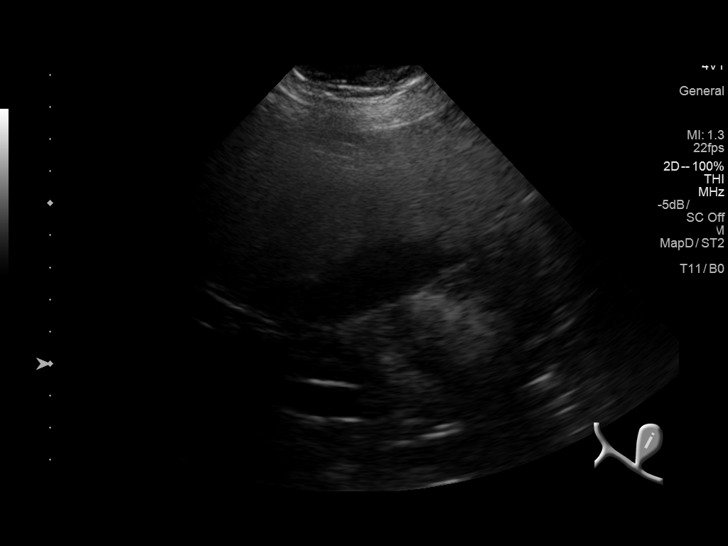
[im 10/38]
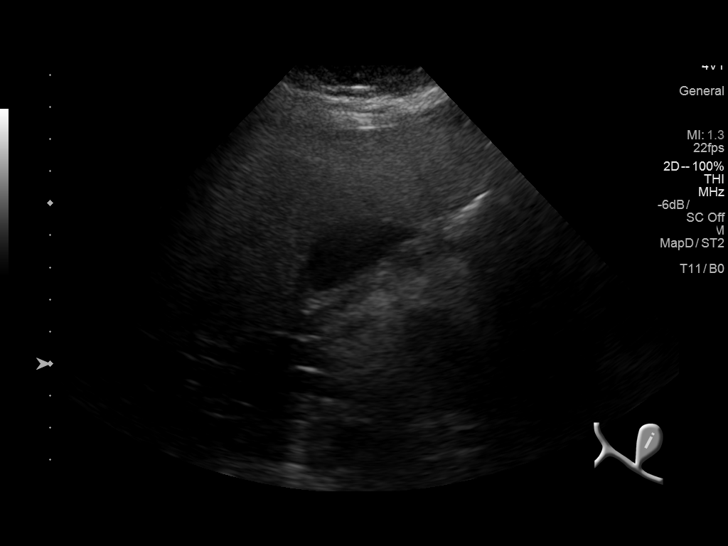
[im 13/38]
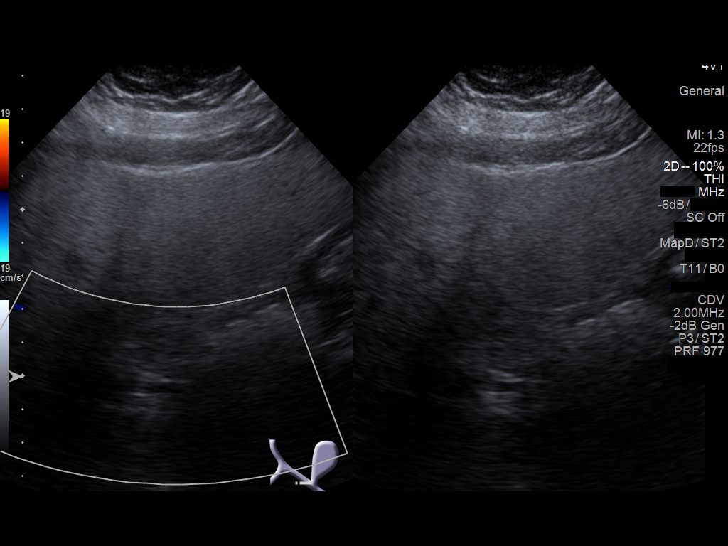
[im 14/38]
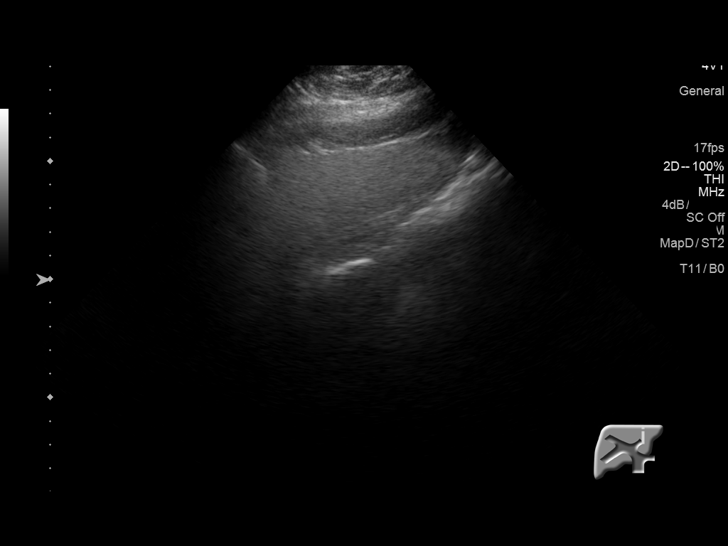
[im 17/38]
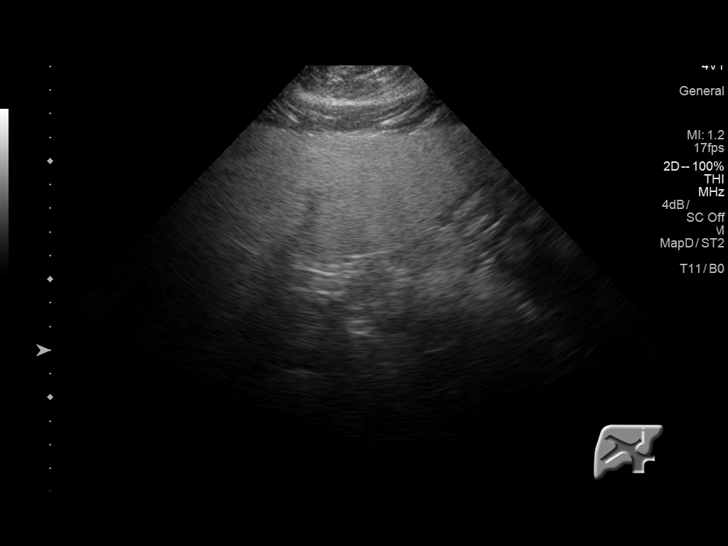
[im 21/38]
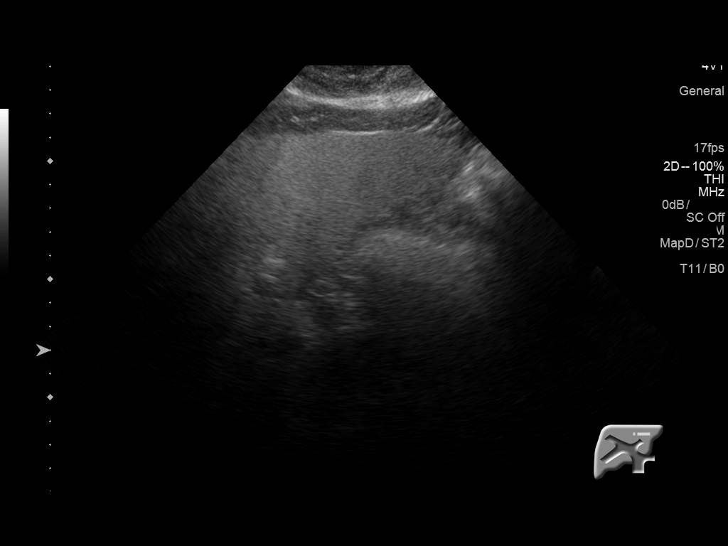
[im 24/38]
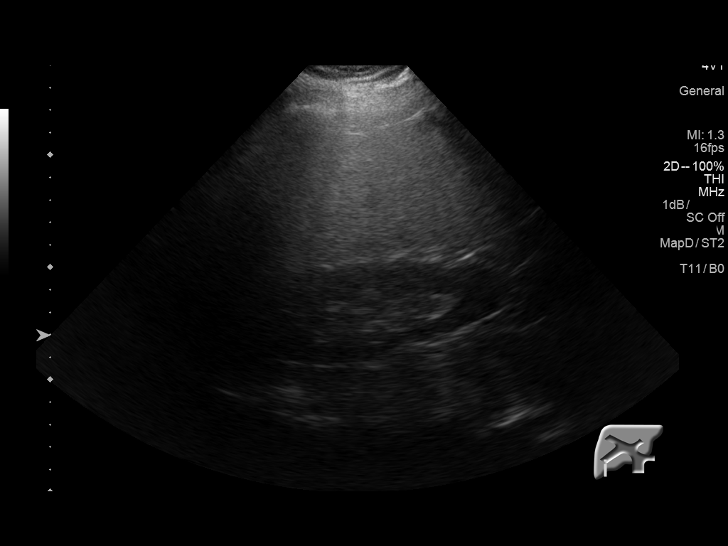
[im 25/38]
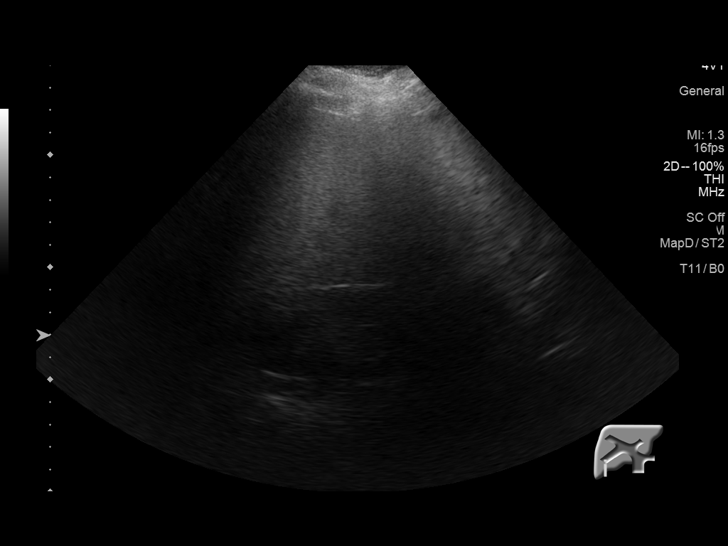
[im 28/38]
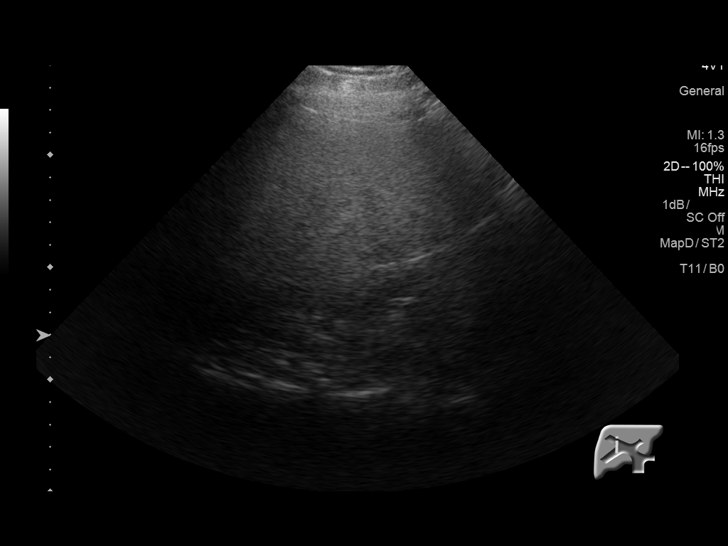
[im 31/38]
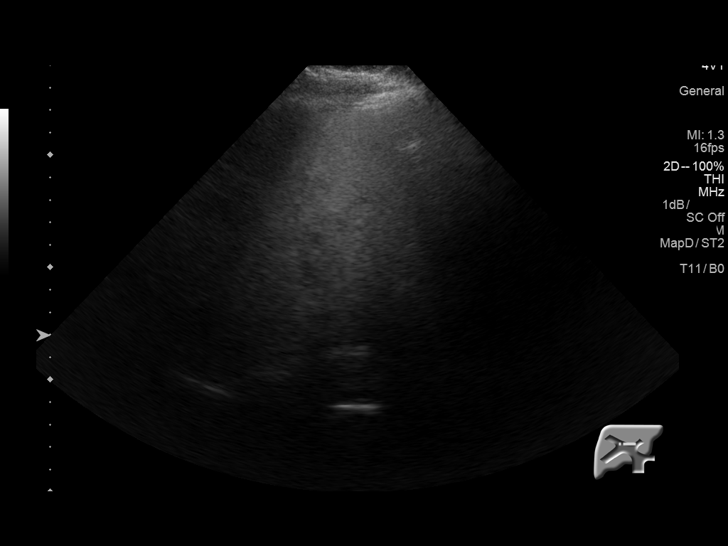
[im 34/38]
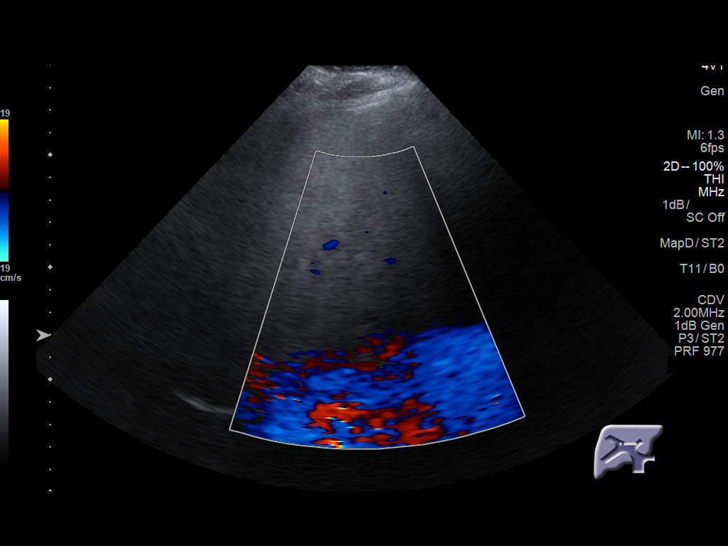
[im 38/38]
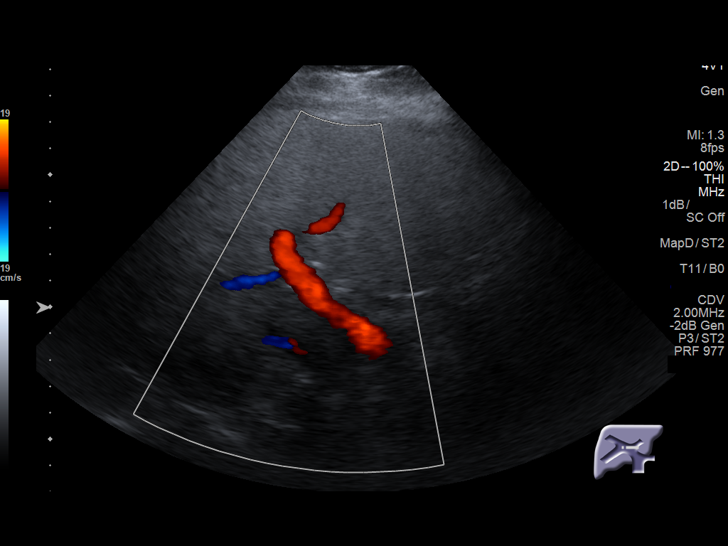

[14 of 25 positions shown; findings below may reference images not displayed]

FINDINGS: Gallbladder:

The gallbladder is adequately distended with no evidence of stones,
wall thickening, or pericholecystic fluid. There is no positive
sonographic Murphy's sign.

Common bile duct:

Diameter: 4 mm.

Liver:

The hepatic echotexture is diffusely increased. There is no focal
mass or ductal dilation. The surface contour of the liver is normal.
The liver measures 22 cm in length.
IMPRESSION: 1. No acute gallbladder pathology. No stones are evident. If
gallbladder dysfunction is suspected clinically, a nuclear medicine
hepatobiliary scan may be useful.
2. Hepatomegaly.  No discrete hepatic mass.

## 2018-02-18 ENCOUNTER — Ambulatory Visit: Payer: Self-pay | Admitting: Orthopedic Surgery

## 2018-02-22 ENCOUNTER — Ambulatory Visit (INDEPENDENT_AMBULATORY_CARE_PROVIDER_SITE_OTHER): Payer: Medicare HMO

## 2018-02-22 ENCOUNTER — Ambulatory Visit: Payer: Medicare HMO | Admitting: Orthopedic Surgery

## 2018-02-22 VITALS — BP 113/65 | HR 99 | Ht 62.0 in | Wt 203.0 lb

## 2018-02-22 DIAGNOSIS — Z96652 Presence of left artificial knee joint: Secondary | ICD-10-CM

## 2018-02-22 NOTE — Progress Notes (Signed)
ANNUAL FOLLOW UP FOR  left TKA   Chief Complaint  Patient presents with  . Follow-up    Recheck left TKA, DOS 03/04/16.     HPI: The patient is here for the annual  follow-up x-ray for knee replacement. The patient is not complaining of pain weakness instability or stiffness in the repaired knee.   Review of Systems  Musculoskeletal: Positive for joint pain.       Right knee pain     Past Medical History:  Diagnosis Date  . Diabetes mellitus without complication ([facility])   . GERD (gastroesophageal reflux disease)   . Hypercholesteremia   . PONV (postoperative nausea and vomiting)      Examination of the left KNEE  BP 113/65   Pulse 99   Ht 5\' 2"  (1.575 m)   Wt 203 lb (92.1 kg)   BMI 37.13 kg/m   General the patient is normally groomed in no distress  Mood normal Affect pleasant   The patient is Awake and alert ; oriented normal   Inspection shows : incision healed nicely without erythema, no tenderness no swelling  Range of motion total range of motion is 105  Stability the knee is stable anterior to posterior as well as medial to lateral  Strength quadriceps strength is normal  Skin no erythema around the skin incision  Cardiovascular NO EDEMA   Neuro: normal sensation in the operative leg   Gait: normal expected gait without cane    Medical decision-making section  X-rays ordered with the following personal interpretation  Normal alignment without loosening   Diagnosis  Encounter Diagnosis  Name Primary?  . S/P total knee replacement, left 03/04/16 Yes     Plan follow-up 1 year repeat x-rays

## 2018-03-30 DIAGNOSIS — E669 Obesity, unspecified: Secondary | ICD-10-CM | POA: Diagnosis not present

## 2018-03-30 DIAGNOSIS — Z823 Family history of stroke: Secondary | ICD-10-CM | POA: Diagnosis not present

## 2018-03-30 DIAGNOSIS — K219 Gastro-esophageal reflux disease without esophagitis: Secondary | ICD-10-CM | POA: Diagnosis not present

## 2018-03-30 DIAGNOSIS — I10 Essential (primary) hypertension: Secondary | ICD-10-CM | POA: Diagnosis not present

## 2018-03-30 DIAGNOSIS — Z6837 Body mass index (BMI) 37.0-37.9, adult: Secondary | ICD-10-CM | POA: Diagnosis not present

## 2018-03-30 DIAGNOSIS — E119 Type 2 diabetes mellitus without complications: Secondary | ICD-10-CM | POA: Diagnosis not present

## 2018-03-30 DIAGNOSIS — H409 Unspecified glaucoma: Secondary | ICD-10-CM | POA: Diagnosis not present

## 2018-03-30 DIAGNOSIS — Z7982 Long term (current) use of aspirin: Secondary | ICD-10-CM | POA: Diagnosis not present

## 2018-03-30 DIAGNOSIS — E039 Hypothyroidism, unspecified: Secondary | ICD-10-CM | POA: Diagnosis not present

## 2018-03-30 DIAGNOSIS — Z7984 Long term (current) use of oral hypoglycemic drugs: Secondary | ICD-10-CM | POA: Diagnosis not present

## 2018-04-05 DIAGNOSIS — H2513 Age-related nuclear cataract, bilateral: Secondary | ICD-10-CM | POA: Diagnosis not present

## 2018-04-05 DIAGNOSIS — H401134 Primary open-angle glaucoma, bilateral, indeterminate stage: Secondary | ICD-10-CM | POA: Diagnosis not present

## 2018-06-08 DIAGNOSIS — E119 Type 2 diabetes mellitus without complications: Secondary | ICD-10-CM | POA: Diagnosis not present

## 2018-06-08 DIAGNOSIS — E785 Hyperlipidemia, unspecified: Secondary | ICD-10-CM | POA: Diagnosis not present

## 2018-06-08 DIAGNOSIS — Z7984 Long term (current) use of oral hypoglycemic drugs: Secondary | ICD-10-CM | POA: Diagnosis not present

## 2018-06-08 DIAGNOSIS — I10 Essential (primary) hypertension: Secondary | ICD-10-CM | POA: Diagnosis not present

## 2018-06-08 DIAGNOSIS — M1990 Unspecified osteoarthritis, unspecified site: Secondary | ICD-10-CM | POA: Diagnosis not present

## 2018-06-08 DIAGNOSIS — E039 Hypothyroidism, unspecified: Secondary | ICD-10-CM | POA: Diagnosis not present

## 2018-06-17 DIAGNOSIS — E039 Hypothyroidism, unspecified: Secondary | ICD-10-CM | POA: Diagnosis not present

## 2018-06-17 DIAGNOSIS — Z23 Encounter for immunization: Secondary | ICD-10-CM | POA: Diagnosis not present

## 2018-06-17 DIAGNOSIS — E1159 Type 2 diabetes mellitus with other circulatory complications: Secondary | ICD-10-CM | POA: Diagnosis not present

## 2018-10-12 DIAGNOSIS — E119 Type 2 diabetes mellitus without complications: Secondary | ICD-10-CM | POA: Diagnosis not present

## 2018-10-12 DIAGNOSIS — H401131 Primary open-angle glaucoma, bilateral, mild stage: Secondary | ICD-10-CM | POA: Diagnosis not present

## 2018-10-12 DIAGNOSIS — H2513 Age-related nuclear cataract, bilateral: Secondary | ICD-10-CM | POA: Diagnosis not present

## 2018-10-17 DIAGNOSIS — E1159 Type 2 diabetes mellitus with other circulatory complications: Secondary | ICD-10-CM | POA: Diagnosis not present

## 2018-10-17 IMAGING — MG DIGITAL DIAGNOSTIC UNILATERAL LEFT MAMMOGRAM
3 series · 3 of 3 positions shown · non-contrast
Comparison: Previous exam(s).

CLINICAL DATA: Screening recall for left breast calcifications.

EXAM:
DIGITAL DIAGNOSTIC LEFT MAMMOGRAM

[L ML (1 of 2)]
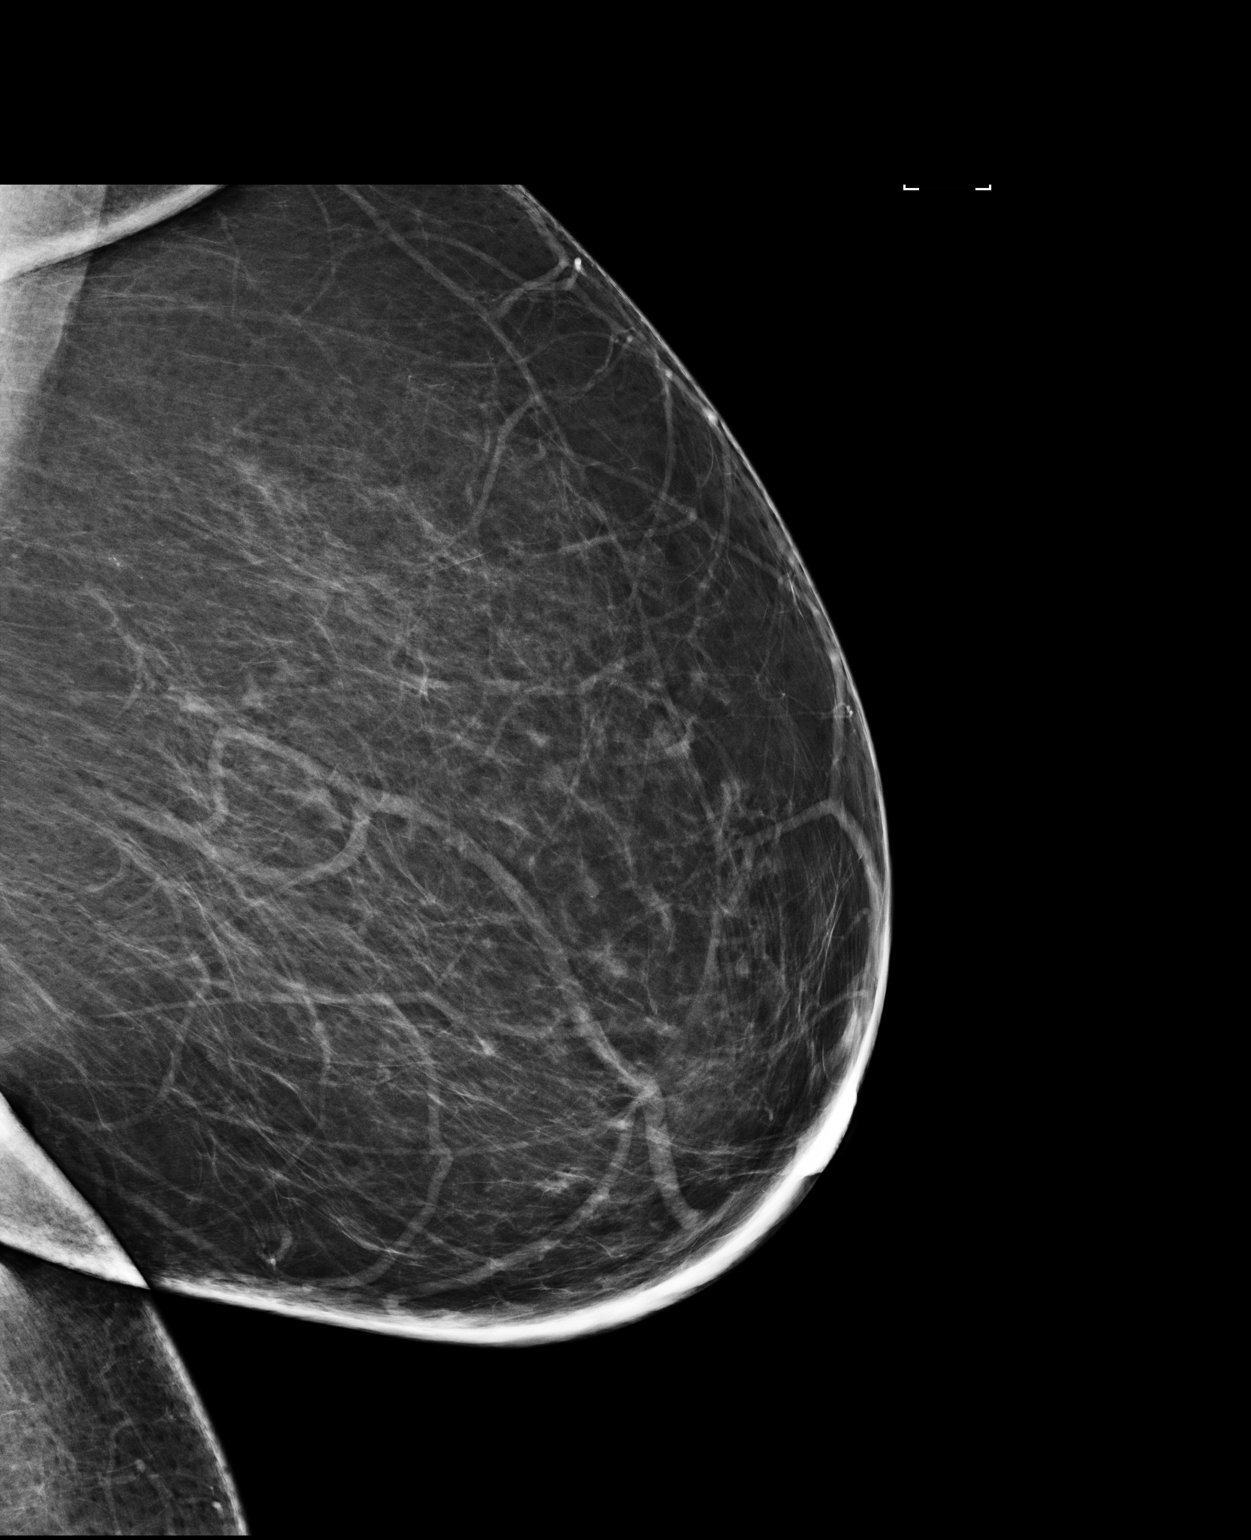

[L ML (2 of 2)]
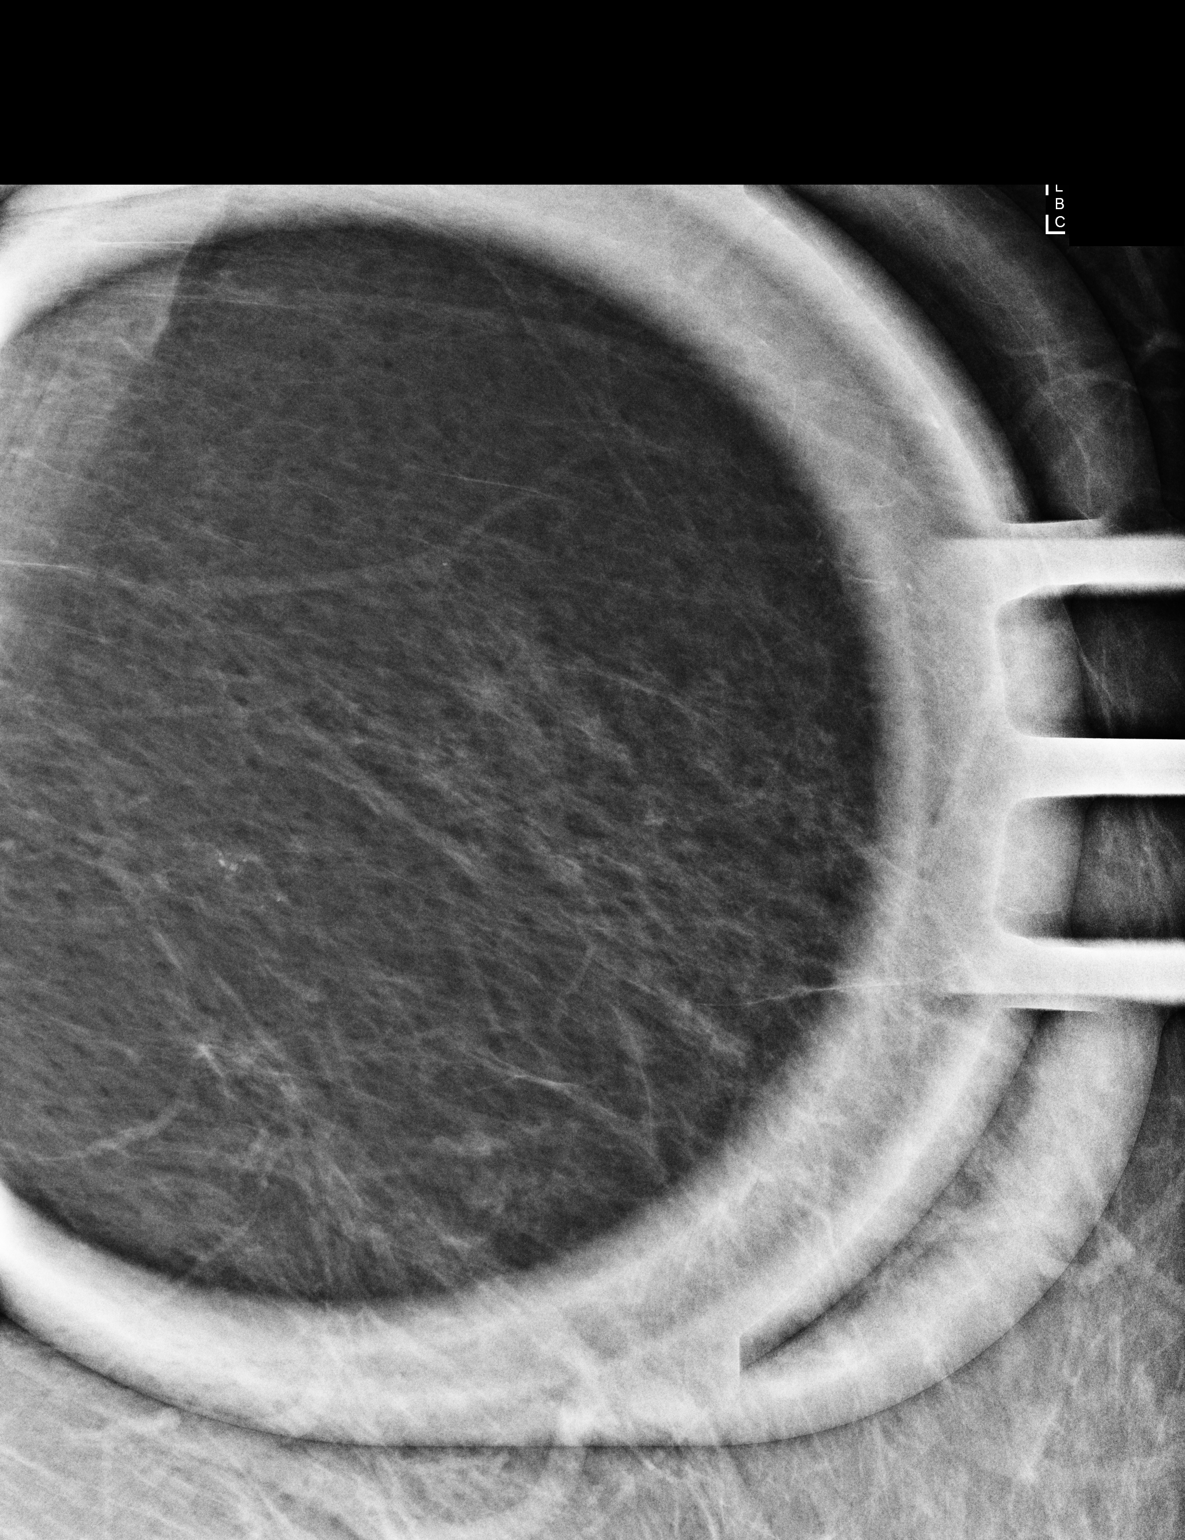

[L CC]
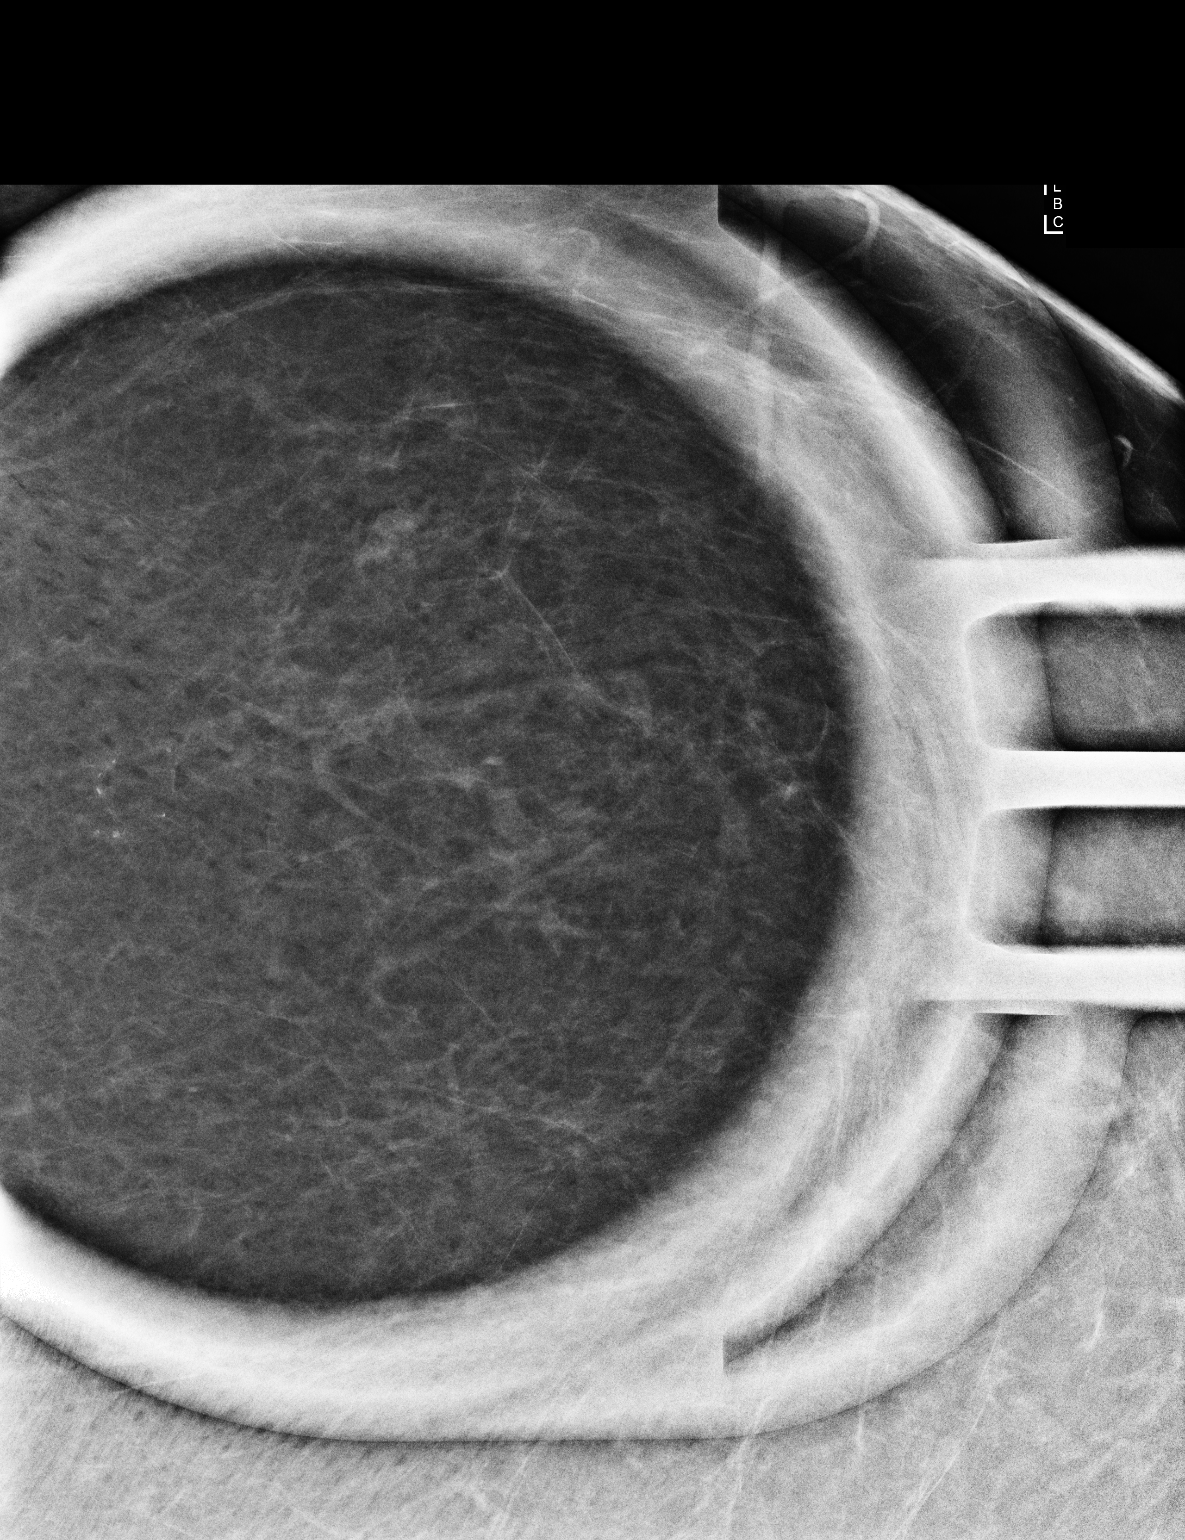

[3 of 3 positions shown; findings below may reference images not displayed]

ACR Breast Density Category b: There are scattered areas of
fibroglandular density.
FINDINGS: In the upper-outer quadrant of the left breast, posterior depth
there is a 1.0 cm group of pleomorphic calcifications. No other
suspicious calcifications, masses or areas of distortion are seen in
the left breast.
IMPRESSION: There is an indeterminate 1.0 cm group of calcifications in the
upper-outer left breast.

RECOMMENDATION:
Stereotactic biopsy is recommended for the left breast
calcifications. This biopsy will be scheduled to be performed at the
[REDACTED] in [HOSPITAL].

I have discussed the findings and recommendations with the patient.
Results were also provided in writing at the conclusion of the
visit. If applicable, a reminder letter will be sent to the patient
regarding the next appointment.

BI-RADS CATEGORY  4: Suspicious.

## 2018-11-16 DIAGNOSIS — E1159 Type 2 diabetes mellitus with other circulatory complications: Secondary | ICD-10-CM | POA: Diagnosis not present

## 2018-11-16 DIAGNOSIS — I10 Essential (primary) hypertension: Secondary | ICD-10-CM | POA: Diagnosis not present

## 2019-02-02 ENCOUNTER — Other Ambulatory Visit (HOSPITAL_COMMUNITY): Payer: Self-pay | Admitting: Internal Medicine

## 2019-02-02 DIAGNOSIS — Z1231 Encounter for screening mammogram for malignant neoplasm of breast: Secondary | ICD-10-CM

## 2019-02-08 ENCOUNTER — Other Ambulatory Visit: Payer: Self-pay

## 2019-02-08 ENCOUNTER — Encounter (HOSPITAL_COMMUNITY): Payer: Self-pay

## 2019-02-08 ENCOUNTER — Ambulatory Visit (HOSPITAL_COMMUNITY)
Admission: RE | Admit: 2019-02-08 | Discharge: 2019-02-08 | Disposition: A | Discharge status: Home / Self Care | Payer: Medicare HMO | Source: Ambulatory Visit | Attending: Internal Medicine | Admitting: Internal Medicine

## 2019-02-08 DIAGNOSIS — Z1231 Encounter for screening mammogram for malignant neoplasm of breast: Secondary | ICD-10-CM | POA: Insufficient documentation

## 2019-02-24 ENCOUNTER — Other Ambulatory Visit: Payer: Self-pay

## 2019-02-24 ENCOUNTER — Encounter: Payer: Self-pay | Admitting: Orthopedic Surgery

## 2019-02-24 ENCOUNTER — Ambulatory Visit (INDEPENDENT_AMBULATORY_CARE_PROVIDER_SITE_OTHER): Payer: Medicare HMO | Admitting: Orthopedic Surgery

## 2019-02-24 ENCOUNTER — Ambulatory Visit (INDEPENDENT_AMBULATORY_CARE_PROVIDER_SITE_OTHER): Payer: Medicare HMO

## 2019-02-24 VITALS — BP 125/73 | HR 107 | Temp 97.1°F | Ht 62.0 in | Wt 197.0 lb

## 2019-02-24 DIAGNOSIS — Z96652 Presence of left artificial knee joint: Secondary | ICD-10-CM | POA: Diagnosis not present

## 2019-02-24 NOTE — Progress Notes (Signed)
ANNUAL FOLLOW UP FOR  LEFT  TKA   Chief Complaint  Patient presents with  . Routine Post Op    03/04/16 left knee feels well     HPI: The patient is here for the annual  follow-up x-ray for knee replacement. The patient is not complaining of pain weakness instability or stiffness in the repaired knee.   ROS  No complaints    Examination of the left KNEE  BP 125/73   Pulse (!) 107   Ht 5\' 2"  (1.575 m)   Wt 197 lb (89.4 kg)   BMI 36.03 kg/m   General the patient is normally groomed in no distress  Inspection shows : incision healed nicely without erythema, no tenderness no swelling  Range of motion total range of motion is 120  Stability the knee is stable anterior to posterior as well as medial to lateral  Strength quadriceps strength is normal  Skin no erythema around the skin incision  Neuro: normal sensation in the operative leg   Gait: normal expected gait without cane    Medical decision-making section  X-rays ordered, internal imaging shows (see full dictated report) stable implant with no signs of loosening  Diagnosis  Encounter Diagnosis  Name Primary?  . S/P total knee replacement, left 03/04/16 Yes     Plan follow-up 1 year repeat x-rays

## 2019-04-14 DIAGNOSIS — E039 Hypothyroidism, unspecified: Secondary | ICD-10-CM | POA: Diagnosis not present

## 2019-04-14 DIAGNOSIS — E1122 Type 2 diabetes mellitus with diabetic chronic kidney disease: Secondary | ICD-10-CM | POA: Diagnosis not present

## 2019-04-14 DIAGNOSIS — I10 Essential (primary) hypertension: Secondary | ICD-10-CM | POA: Diagnosis not present

## 2019-04-14 DIAGNOSIS — Z79899 Other long term (current) drug therapy: Secondary | ICD-10-CM | POA: Diagnosis not present

## 2019-04-20 DIAGNOSIS — E1129 Type 2 diabetes mellitus with other diabetic kidney complication: Secondary | ICD-10-CM | POA: Diagnosis not present

## 2019-04-20 DIAGNOSIS — E039 Hypothyroidism, unspecified: Secondary | ICD-10-CM | POA: Diagnosis not present

## 2019-04-20 DIAGNOSIS — I10 Essential (primary) hypertension: Secondary | ICD-10-CM | POA: Diagnosis not present

## 2019-04-20 DIAGNOSIS — E785 Hyperlipidemia, unspecified: Secondary | ICD-10-CM | POA: Diagnosis not present

## 2019-05-02 ENCOUNTER — Other Ambulatory Visit: Payer: Self-pay | Admitting: Orthopedic Surgery

## 2019-05-24 DIAGNOSIS — H2513 Age-related nuclear cataract, bilateral: Secondary | ICD-10-CM | POA: Diagnosis not present

## 2019-05-24 DIAGNOSIS — H401131 Primary open-angle glaucoma, bilateral, mild stage: Secondary | ICD-10-CM | POA: Diagnosis not present

## 2019-06-20 DIAGNOSIS — Z23 Encounter for immunization: Secondary | ICD-10-CM | POA: Diagnosis not present

## 2019-08-21 DIAGNOSIS — Z79899 Other long term (current) drug therapy: Secondary | ICD-10-CM | POA: Diagnosis not present

## 2019-08-21 DIAGNOSIS — I10 Essential (primary) hypertension: Secondary | ICD-10-CM | POA: Diagnosis not present

## 2019-08-21 DIAGNOSIS — E1129 Type 2 diabetes mellitus with other diabetic kidney complication: Secondary | ICD-10-CM | POA: Diagnosis not present

## 2019-08-21 DIAGNOSIS — E039 Hypothyroidism, unspecified: Secondary | ICD-10-CM | POA: Diagnosis not present

## 2019-08-21 DIAGNOSIS — E785 Hyperlipidemia, unspecified: Secondary | ICD-10-CM | POA: Diagnosis not present

## 2019-08-28 DIAGNOSIS — I10 Essential (primary) hypertension: Secondary | ICD-10-CM | POA: Diagnosis not present

## 2019-08-28 DIAGNOSIS — E1129 Type 2 diabetes mellitus with other diabetic kidney complication: Secondary | ICD-10-CM | POA: Diagnosis not present

## 2019-11-13 ENCOUNTER — Other Ambulatory Visit: Payer: Self-pay | Admitting: Orthopedic Surgery

## 2019-11-29 DIAGNOSIS — H401132 Primary open-angle glaucoma, bilateral, moderate stage: Secondary | ICD-10-CM | POA: Diagnosis not present

## 2019-11-29 DIAGNOSIS — H2513 Age-related nuclear cataract, bilateral: Secondary | ICD-10-CM | POA: Diagnosis not present

## 2019-12-10 DIAGNOSIS — E785 Hyperlipidemia, unspecified: Secondary | ICD-10-CM | POA: Diagnosis not present

## 2019-12-10 DIAGNOSIS — M1990 Unspecified osteoarthritis, unspecified site: Secondary | ICD-10-CM | POA: Diagnosis not present

## 2019-12-10 DIAGNOSIS — I10 Essential (primary) hypertension: Secondary | ICD-10-CM | POA: Diagnosis not present

## 2019-12-10 DIAGNOSIS — E1165 Type 2 diabetes mellitus with hyperglycemia: Secondary | ICD-10-CM | POA: Diagnosis not present

## 2019-12-10 DIAGNOSIS — E039 Hypothyroidism, unspecified: Secondary | ICD-10-CM | POA: Diagnosis not present

## 2019-12-10 DIAGNOSIS — Z6836 Body mass index (BMI) 36.0-36.9, adult: Secondary | ICD-10-CM | POA: Diagnosis not present

## 2019-12-10 DIAGNOSIS — H409 Unspecified glaucoma: Secondary | ICD-10-CM | POA: Diagnosis not present

## 2019-12-10 DIAGNOSIS — Z7984 Long term (current) use of oral hypoglycemic drugs: Secondary | ICD-10-CM | POA: Diagnosis not present

## 2019-12-10 DIAGNOSIS — E1159 Type 2 diabetes mellitus with other circulatory complications: Secondary | ICD-10-CM | POA: Diagnosis not present

## 2019-12-19 DIAGNOSIS — E1129 Type 2 diabetes mellitus with other diabetic kidney complication: Secondary | ICD-10-CM | POA: Diagnosis not present

## 2019-12-26 DIAGNOSIS — E1129 Type 2 diabetes mellitus with other diabetic kidney complication: Secondary | ICD-10-CM | POA: Diagnosis not present

## 2019-12-26 DIAGNOSIS — Z6837 Body mass index (BMI) 37.0-37.9, adult: Secondary | ICD-10-CM | POA: Diagnosis not present

## 2019-12-26 DIAGNOSIS — I10 Essential (primary) hypertension: Secondary | ICD-10-CM | POA: Diagnosis not present

## 2020-01-29 ENCOUNTER — Other Ambulatory Visit (HOSPITAL_COMMUNITY): Payer: Self-pay | Admitting: Internal Medicine

## 2020-01-29 DIAGNOSIS — Z1231 Encounter for screening mammogram for malignant neoplasm of breast: Secondary | ICD-10-CM

## 2020-02-09 ENCOUNTER — Ambulatory Visit (HOSPITAL_COMMUNITY)
Admission: RE | Admit: 2020-02-09 | Discharge: 2020-02-09 | Disposition: A | Discharge status: Home / Self Care | Payer: Medicare HMO | Source: Ambulatory Visit | Attending: Internal Medicine | Admitting: Internal Medicine

## 2020-02-09 ENCOUNTER — Other Ambulatory Visit: Payer: Self-pay

## 2020-02-09 DIAGNOSIS — Z1231 Encounter for screening mammogram for malignant neoplasm of breast: Secondary | ICD-10-CM | POA: Diagnosis not present

## 2020-03-01 ENCOUNTER — Ambulatory Visit: Payer: Medicare HMO | Admitting: Orthopedic Surgery

## 2020-03-01 ENCOUNTER — Other Ambulatory Visit: Payer: Self-pay

## 2020-03-01 ENCOUNTER — Ambulatory Visit: Payer: Medicare HMO

## 2020-03-01 ENCOUNTER — Encounter: Payer: Self-pay | Admitting: Orthopedic Surgery

## 2020-03-01 VITALS — BP 121/74 | HR 96 | Ht 62.0 in | Wt 200.0 lb

## 2020-03-01 DIAGNOSIS — M1712 Unilateral primary osteoarthritis, left knee: Secondary | ICD-10-CM | POA: Diagnosis not present

## 2020-03-01 DIAGNOSIS — Z96652 Presence of left artificial knee joint: Secondary | ICD-10-CM | POA: Diagnosis not present

## 2020-03-01 DIAGNOSIS — M1710 Unilateral primary osteoarthritis, unspecified knee: Secondary | ICD-10-CM

## 2020-03-01 NOTE — Progress Notes (Signed)
Patient ID: Stephanie [name], female   DOB: Feb 19, 1951, 69 y.o.   MRN: [id number]  Chief Complaint  Patient presents with  . Follow-up    Left TKA DOS 03/04/16    HPI Stephanie [name] is a 69 y.o. female.  4 yrs post op  Status post left  total knee arthroplasty. The patient is doing well the knee implant is functioning well.     Physical Exam BP 121/74   Pulse 96   Ht 5\' 2"  (1.575 m)   Wt 200 lb (90.7 kg)   BMI 36.58 kg/m   Gait: normal   Assist device no   ROM: 0-120    Data Reviewed Today's imaging shows stable total knee implant without loosening or complication  Assessment Stable tka    Plan Return 1 yr xrays     03/01/2020, 9:04 AM

## 2020-03-11 DIAGNOSIS — H401132 Primary open-angle glaucoma, bilateral, moderate stage: Secondary | ICD-10-CM | POA: Diagnosis not present

## 2020-04-11 DIAGNOSIS — E785 Hyperlipidemia, unspecified: Secondary | ICD-10-CM | POA: Diagnosis not present

## 2020-04-11 DIAGNOSIS — E039 Hypothyroidism, unspecified: Secondary | ICD-10-CM | POA: Diagnosis not present

## 2020-04-11 DIAGNOSIS — E1129 Type 2 diabetes mellitus with other diabetic kidney complication: Secondary | ICD-10-CM | POA: Diagnosis not present

## 2020-04-11 DIAGNOSIS — K219 Gastro-esophageal reflux disease without esophagitis: Secondary | ICD-10-CM | POA: Diagnosis not present

## 2020-04-11 DIAGNOSIS — I10 Essential (primary) hypertension: Secondary | ICD-10-CM | POA: Diagnosis not present

## 2020-04-11 DIAGNOSIS — Z79899 Other long term (current) drug therapy: Secondary | ICD-10-CM | POA: Diagnosis not present

## 2020-04-11 DIAGNOSIS — H409 Unspecified glaucoma: Secondary | ICD-10-CM | POA: Diagnosis not present

## 2020-04-11 DIAGNOSIS — M1990 Unspecified osteoarthritis, unspecified site: Secondary | ICD-10-CM | POA: Diagnosis not present

## 2020-04-18 ENCOUNTER — Other Ambulatory Visit (HOSPITAL_COMMUNITY): Payer: Self-pay | Admitting: Internal Medicine

## 2020-04-18 DIAGNOSIS — Z0001 Encounter for general adult medical examination with abnormal findings: Secondary | ICD-10-CM | POA: Diagnosis not present

## 2020-04-18 DIAGNOSIS — E785 Hyperlipidemia, unspecified: Secondary | ICD-10-CM | POA: Diagnosis not present

## 2020-04-18 DIAGNOSIS — Z6837 Body mass index (BMI) 37.0-37.9, adult: Secondary | ICD-10-CM | POA: Diagnosis not present

## 2020-04-18 DIAGNOSIS — N1831 Chronic kidney disease, stage 3a: Secondary | ICD-10-CM | POA: Diagnosis not present

## 2020-04-18 DIAGNOSIS — R9431 Abnormal electrocardiogram [ECG] [EKG]: Secondary | ICD-10-CM | POA: Diagnosis not present

## 2020-04-18 DIAGNOSIS — I10 Essential (primary) hypertension: Secondary | ICD-10-CM | POA: Diagnosis not present

## 2020-04-18 DIAGNOSIS — E039 Hypothyroidism, unspecified: Secondary | ICD-10-CM | POA: Diagnosis not present

## 2020-04-18 DIAGNOSIS — Z780 Asymptomatic menopausal state: Secondary | ICD-10-CM

## 2020-04-18 DIAGNOSIS — E1122 Type 2 diabetes mellitus with diabetic chronic kidney disease: Secondary | ICD-10-CM | POA: Diagnosis not present

## 2020-05-01 ENCOUNTER — Other Ambulatory Visit: Payer: Self-pay

## 2020-05-01 ENCOUNTER — Ambulatory Visit (HOSPITAL_COMMUNITY)
Admission: RE | Admit: 2020-05-01 | Discharge: 2020-05-01 | Disposition: A | Discharge status: Home / Self Care | Payer: Medicare HMO | Source: Ambulatory Visit | Attending: Internal Medicine | Admitting: Internal Medicine

## 2020-05-01 DIAGNOSIS — Z780 Asymptomatic menopausal state: Secondary | ICD-10-CM | POA: Diagnosis not present

## 2020-05-01 DIAGNOSIS — M85852 Other specified disorders of bone density and structure, left thigh: Secondary | ICD-10-CM | POA: Diagnosis not present

## 2020-05-01 DIAGNOSIS — Z90710 Acquired absence of both cervix and uterus: Secondary | ICD-10-CM | POA: Diagnosis not present

## 2020-05-01 DIAGNOSIS — R634 Abnormal weight loss: Secondary | ICD-10-CM | POA: Diagnosis not present

## 2020-05-01 DIAGNOSIS — R29890 Loss of height: Secondary | ICD-10-CM | POA: Diagnosis not present

## 2020-05-31 ENCOUNTER — Other Ambulatory Visit: Payer: Self-pay | Admitting: Orthopedic Surgery

## 2020-05-31 MED ORDER — CEPHALEXIN 500 MG PO CAPS: ORAL_CAPSULE | ORAL | 0 refills | Status: DC

## 2020-05-31 NOTE — Addendum Note (Signed)
Addended byCaffie Damme on: 05/31/2020 10:23 AM   Modules accepted: Orders

## 2020-05-31 NOTE — Addendum Note (Signed)
Addended byCaffie Damme on: 05/31/2020 10:22 AM   Modules accepted: Orders

## 2020-06-04 ENCOUNTER — Other Ambulatory Visit: Payer: Self-pay | Admitting: Orthopedic Surgery

## 2020-07-17 DIAGNOSIS — E119 Type 2 diabetes mellitus without complications: Secondary | ICD-10-CM | POA: Diagnosis not present

## 2020-07-17 DIAGNOSIS — H2513 Age-related nuclear cataract, bilateral: Secondary | ICD-10-CM | POA: Diagnosis not present

## 2020-07-17 DIAGNOSIS — H401131 Primary open-angle glaucoma, bilateral, mild stage: Secondary | ICD-10-CM | POA: Diagnosis not present

## 2020-08-12 DIAGNOSIS — H409 Unspecified glaucoma: Secondary | ICD-10-CM | POA: Diagnosis not present

## 2020-08-12 DIAGNOSIS — E039 Hypothyroidism, unspecified: Secondary | ICD-10-CM | POA: Diagnosis not present

## 2020-08-12 DIAGNOSIS — E1129 Type 2 diabetes mellitus with other diabetic kidney complication: Secondary | ICD-10-CM | POA: Diagnosis not present

## 2020-08-12 DIAGNOSIS — Z79899 Other long term (current) drug therapy: Secondary | ICD-10-CM | POA: Diagnosis not present

## 2020-08-12 DIAGNOSIS — K219 Gastro-esophageal reflux disease without esophagitis: Secondary | ICD-10-CM | POA: Diagnosis not present

## 2020-08-12 DIAGNOSIS — E785 Hyperlipidemia, unspecified: Secondary | ICD-10-CM | POA: Diagnosis not present

## 2020-08-12 DIAGNOSIS — I10 Essential (primary) hypertension: Secondary | ICD-10-CM | POA: Diagnosis not present

## 2020-08-12 DIAGNOSIS — M1990 Unspecified osteoarthritis, unspecified site: Secondary | ICD-10-CM | POA: Diagnosis not present

## 2020-08-20 DIAGNOSIS — I10 Essential (primary) hypertension: Secondary | ICD-10-CM | POA: Diagnosis not present

## 2020-08-20 DIAGNOSIS — E1122 Type 2 diabetes mellitus with diabetic chronic kidney disease: Secondary | ICD-10-CM | POA: Diagnosis not present

## 2020-08-20 DIAGNOSIS — Z23 Encounter for immunization: Secondary | ICD-10-CM | POA: Diagnosis not present

## 2020-10-14 DIAGNOSIS — E1122 Type 2 diabetes mellitus with diabetic chronic kidney disease: Secondary | ICD-10-CM | POA: Diagnosis not present

## 2020-10-14 DIAGNOSIS — I10 Essential (primary) hypertension: Secondary | ICD-10-CM | POA: Diagnosis not present

## 2020-10-14 DIAGNOSIS — E119 Type 2 diabetes mellitus without complications: Secondary | ICD-10-CM | POA: Diagnosis not present

## 2020-10-14 DIAGNOSIS — K219 Gastro-esophageal reflux disease without esophagitis: Secondary | ICD-10-CM | POA: Diagnosis not present

## 2020-11-20 DIAGNOSIS — H401131 Primary open-angle glaucoma, bilateral, mild stage: Secondary | ICD-10-CM | POA: Diagnosis not present

## 2020-11-20 DIAGNOSIS — H2513 Age-related nuclear cataract, bilateral: Secondary | ICD-10-CM | POA: Diagnosis not present

## 2020-11-20 DIAGNOSIS — E119 Type 2 diabetes mellitus without complications: Secondary | ICD-10-CM | POA: Diagnosis not present

## 2020-12-11 DIAGNOSIS — Z79899 Other long term (current) drug therapy: Secondary | ICD-10-CM | POA: Diagnosis not present

## 2020-12-11 DIAGNOSIS — I10 Essential (primary) hypertension: Secondary | ICD-10-CM | POA: Diagnosis not present

## 2020-12-11 DIAGNOSIS — E1129 Type 2 diabetes mellitus with other diabetic kidney complication: Secondary | ICD-10-CM | POA: Diagnosis not present

## 2020-12-14 DIAGNOSIS — E785 Hyperlipidemia, unspecified: Secondary | ICD-10-CM | POA: Diagnosis not present

## 2020-12-14 DIAGNOSIS — I10 Essential (primary) hypertension: Secondary | ICD-10-CM | POA: Diagnosis not present

## 2020-12-14 DIAGNOSIS — K219 Gastro-esophageal reflux disease without esophagitis: Secondary | ICD-10-CM | POA: Diagnosis not present

## 2020-12-14 DIAGNOSIS — E119 Type 2 diabetes mellitus without complications: Secondary | ICD-10-CM | POA: Diagnosis not present

## 2020-12-18 DIAGNOSIS — I10 Essential (primary) hypertension: Secondary | ICD-10-CM | POA: Diagnosis not present

## 2020-12-18 DIAGNOSIS — N1831 Chronic kidney disease, stage 3a: Secondary | ICD-10-CM | POA: Diagnosis not present

## 2020-12-18 DIAGNOSIS — R7309 Other abnormal glucose: Secondary | ICD-10-CM | POA: Diagnosis not present

## 2020-12-18 DIAGNOSIS — E1122 Type 2 diabetes mellitus with diabetic chronic kidney disease: Secondary | ICD-10-CM | POA: Diagnosis not present

## 2021-01-10 DIAGNOSIS — H409 Unspecified glaucoma: Secondary | ICD-10-CM | POA: Diagnosis not present

## 2021-01-10 DIAGNOSIS — E785 Hyperlipidemia, unspecified: Secondary | ICD-10-CM | POA: Diagnosis not present

## 2021-01-10 DIAGNOSIS — E039 Hypothyroidism, unspecified: Secondary | ICD-10-CM | POA: Diagnosis not present

## 2021-01-10 DIAGNOSIS — Z809 Family history of malignant neoplasm, unspecified: Secondary | ICD-10-CM | POA: Diagnosis not present

## 2021-01-10 DIAGNOSIS — Z7984 Long term (current) use of oral hypoglycemic drugs: Secondary | ICD-10-CM | POA: Diagnosis not present

## 2021-01-10 DIAGNOSIS — E1165 Type 2 diabetes mellitus with hyperglycemia: Secondary | ICD-10-CM | POA: Diagnosis not present

## 2021-01-10 DIAGNOSIS — Z6837 Body mass index (BMI) 37.0-37.9, adult: Secondary | ICD-10-CM | POA: Diagnosis not present

## 2021-01-10 DIAGNOSIS — I10 Essential (primary) hypertension: Secondary | ICD-10-CM | POA: Diagnosis not present

## 2021-01-10 DIAGNOSIS — Z820 Family history of epilepsy and other diseases of the nervous system: Secondary | ICD-10-CM | POA: Diagnosis not present

## 2021-01-16 ENCOUNTER — Other Ambulatory Visit (HOSPITAL_COMMUNITY): Payer: Self-pay | Admitting: Internal Medicine

## 2021-01-16 DIAGNOSIS — Z1231 Encounter for screening mammogram for malignant neoplasm of breast: Secondary | ICD-10-CM

## 2021-02-10 ENCOUNTER — Ambulatory Visit (HOSPITAL_COMMUNITY)
Admission: RE | Admit: 2021-02-10 | Discharge: 2021-02-10 | Disposition: A | Discharge status: Home / Self Care | Payer: Medicare HMO | Source: Ambulatory Visit | Attending: Internal Medicine | Admitting: Internal Medicine

## 2021-02-10 ENCOUNTER — Other Ambulatory Visit: Payer: Self-pay

## 2021-02-10 DIAGNOSIS — Z1231 Encounter for screening mammogram for malignant neoplasm of breast: Secondary | ICD-10-CM

## 2021-03-26 DIAGNOSIS — H40053 Ocular hypertension, bilateral: Secondary | ICD-10-CM | POA: Diagnosis not present

## 2021-03-26 DIAGNOSIS — E119 Type 2 diabetes mellitus without complications: Secondary | ICD-10-CM | POA: Diagnosis not present

## 2021-03-26 DIAGNOSIS — H25813 Combined forms of age-related cataract, bilateral: Secondary | ICD-10-CM | POA: Diagnosis not present

## 2021-03-31 ENCOUNTER — Encounter: Payer: Self-pay | Admitting: Orthopedic Surgery

## 2021-03-31 ENCOUNTER — Ambulatory Visit: Payer: Medicare HMO

## 2021-03-31 ENCOUNTER — Ambulatory Visit: Payer: Medicare HMO | Admitting: Orthopedic Surgery

## 2021-03-31 ENCOUNTER — Other Ambulatory Visit: Payer: Self-pay

## 2021-03-31 DIAGNOSIS — M1711 Unilateral primary osteoarthritis, right knee: Secondary | ICD-10-CM

## 2021-03-31 DIAGNOSIS — G8929 Other chronic pain: Secondary | ICD-10-CM

## 2021-03-31 DIAGNOSIS — Z96652 Presence of left artificial knee joint: Secondary | ICD-10-CM | POA: Diagnosis not present

## 2021-03-31 DIAGNOSIS — M1712 Unilateral primary osteoarthritis, left knee: Secondary | ICD-10-CM

## 2021-03-31 NOTE — Patient Instructions (Signed)
Call us 1 month before you want to schedule the knee replacement on the right

## 2021-03-31 NOTE — Progress Notes (Signed)
Chief Complaint  Patient presents with   Knee Pain    Right/    Left total knee March 04, 2016  Patient has right knee arthritis right knee pain but is not ready for knee replacement according to her  Her left total knee was done in 2017 she had a posterior stabilized fixed-bearing Sigma total knee doing well  Recent hemoglobin A1c was 6  Left knee 0-1 20 range of motion right knee 5-1 10  X-ray of the right knee shows osteoarthritis varus less than 10 degrees grade 4 arthritis medial compartment  Left knee replacement x-ray was normal without loosening  Patient will call us when she is ready to have surgery on her right knee

## 2021-04-16 DIAGNOSIS — K219 Gastro-esophageal reflux disease without esophagitis: Secondary | ICD-10-CM | POA: Diagnosis not present

## 2021-04-16 DIAGNOSIS — I10 Essential (primary) hypertension: Secondary | ICD-10-CM | POA: Diagnosis not present

## 2021-04-16 DIAGNOSIS — E785 Hyperlipidemia, unspecified: Secondary | ICD-10-CM | POA: Diagnosis not present

## 2021-04-16 DIAGNOSIS — E119 Type 2 diabetes mellitus without complications: Secondary | ICD-10-CM | POA: Diagnosis not present

## 2021-04-17 DIAGNOSIS — M1990 Unspecified osteoarthritis, unspecified site: Secondary | ICD-10-CM | POA: Diagnosis not present

## 2021-04-17 DIAGNOSIS — K760 Fatty (change of) liver, not elsewhere classified: Secondary | ICD-10-CM | POA: Diagnosis not present

## 2021-04-17 DIAGNOSIS — Z79899 Other long term (current) drug therapy: Secondary | ICD-10-CM | POA: Diagnosis not present

## 2021-04-17 DIAGNOSIS — E785 Hyperlipidemia, unspecified: Secondary | ICD-10-CM | POA: Diagnosis not present

## 2021-04-17 DIAGNOSIS — H409 Unspecified glaucoma: Secondary | ICD-10-CM | POA: Diagnosis not present

## 2021-04-17 DIAGNOSIS — E039 Hypothyroidism, unspecified: Secondary | ICD-10-CM | POA: Diagnosis not present

## 2021-04-17 DIAGNOSIS — I10 Essential (primary) hypertension: Secondary | ICD-10-CM | POA: Diagnosis not present

## 2021-04-17 DIAGNOSIS — E1129 Type 2 diabetes mellitus with other diabetic kidney complication: Secondary | ICD-10-CM | POA: Diagnosis not present

## 2021-04-17 DIAGNOSIS — K219 Gastro-esophageal reflux disease without esophagitis: Secondary | ICD-10-CM | POA: Diagnosis not present

## 2021-04-17 DIAGNOSIS — N183 Chronic kidney disease, stage 3 unspecified: Secondary | ICD-10-CM | POA: Diagnosis not present

## 2021-04-24 DIAGNOSIS — Z23 Encounter for immunization: Secondary | ICD-10-CM | POA: Diagnosis not present

## 2021-04-24 DIAGNOSIS — N1831 Chronic kidney disease, stage 3a: Secondary | ICD-10-CM | POA: Diagnosis not present

## 2021-04-24 DIAGNOSIS — E785 Hyperlipidemia, unspecified: Secondary | ICD-10-CM | POA: Diagnosis not present

## 2021-04-24 DIAGNOSIS — E559 Vitamin D deficiency, unspecified: Secondary | ICD-10-CM | POA: Diagnosis not present

## 2021-04-24 DIAGNOSIS — E1122 Type 2 diabetes mellitus with diabetic chronic kidney disease: Secondary | ICD-10-CM | POA: Diagnosis not present

## 2021-04-24 DIAGNOSIS — E039 Hypothyroidism, unspecified: Secondary | ICD-10-CM | POA: Diagnosis not present

## 2021-04-24 DIAGNOSIS — R000 Tachycardia, unspecified: Secondary | ICD-10-CM | POA: Diagnosis not present

## 2021-08-19 DIAGNOSIS — K219 Gastro-esophageal reflux disease without esophagitis: Secondary | ICD-10-CM | POA: Diagnosis not present

## 2021-08-19 DIAGNOSIS — N1831 Chronic kidney disease, stage 3a: Secondary | ICD-10-CM | POA: Diagnosis not present

## 2021-08-19 DIAGNOSIS — E785 Hyperlipidemia, unspecified: Secondary | ICD-10-CM | POA: Diagnosis not present

## 2021-08-19 DIAGNOSIS — M1990 Unspecified osteoarthritis, unspecified site: Secondary | ICD-10-CM | POA: Diagnosis not present

## 2021-08-19 DIAGNOSIS — E039 Hypothyroidism, unspecified: Secondary | ICD-10-CM | POA: Diagnosis not present

## 2021-08-19 DIAGNOSIS — I10 Essential (primary) hypertension: Secondary | ICD-10-CM | POA: Diagnosis not present

## 2021-08-19 DIAGNOSIS — E1129 Type 2 diabetes mellitus with other diabetic kidney complication: Secondary | ICD-10-CM | POA: Diagnosis not present

## 2021-08-19 DIAGNOSIS — E559 Vitamin D deficiency, unspecified: Secondary | ICD-10-CM | POA: Diagnosis not present

## 2021-08-25 DIAGNOSIS — N1831 Chronic kidney disease, stage 3a: Secondary | ICD-10-CM | POA: Diagnosis not present

## 2021-08-25 DIAGNOSIS — E1122 Type 2 diabetes mellitus with diabetic chronic kidney disease: Secondary | ICD-10-CM | POA: Diagnosis not present

## 2021-08-25 DIAGNOSIS — R7309 Other abnormal glucose: Secondary | ICD-10-CM | POA: Diagnosis not present

## 2021-08-25 DIAGNOSIS — E039 Hypothyroidism, unspecified: Secondary | ICD-10-CM | POA: Diagnosis not present

## 2021-09-26 DIAGNOSIS — Z823 Family history of stroke: Secondary | ICD-10-CM | POA: Diagnosis not present

## 2021-09-26 DIAGNOSIS — H409 Unspecified glaucoma: Secondary | ICD-10-CM | POA: Diagnosis not present

## 2021-09-26 DIAGNOSIS — I10 Essential (primary) hypertension: Secondary | ICD-10-CM | POA: Diagnosis not present

## 2021-09-26 DIAGNOSIS — Z7982 Long term (current) use of aspirin: Secondary | ICD-10-CM | POA: Diagnosis not present

## 2021-09-26 DIAGNOSIS — E785 Hyperlipidemia, unspecified: Secondary | ICD-10-CM | POA: Diagnosis not present

## 2021-09-26 DIAGNOSIS — Z809 Family history of malignant neoplasm, unspecified: Secondary | ICD-10-CM | POA: Diagnosis not present

## 2021-09-26 DIAGNOSIS — E1165 Type 2 diabetes mellitus with hyperglycemia: Secondary | ICD-10-CM | POA: Diagnosis not present

## 2021-09-26 DIAGNOSIS — Z6837 Body mass index (BMI) 37.0-37.9, adult: Secondary | ICD-10-CM | POA: Diagnosis not present

## 2021-09-26 DIAGNOSIS — Z7984 Long term (current) use of oral hypoglycemic drugs: Secondary | ICD-10-CM | POA: Diagnosis not present

## 2021-09-26 DIAGNOSIS — K219 Gastro-esophageal reflux disease without esophagitis: Secondary | ICD-10-CM | POA: Diagnosis not present

## 2021-09-26 DIAGNOSIS — E039 Hypothyroidism, unspecified: Secondary | ICD-10-CM | POA: Diagnosis not present

## 2021-09-29 DIAGNOSIS — H40053 Ocular hypertension, bilateral: Secondary | ICD-10-CM | POA: Diagnosis not present

## 2021-09-29 DIAGNOSIS — H25813 Combined forms of age-related cataract, bilateral: Secondary | ICD-10-CM | POA: Diagnosis not present

## 2021-10-14 DIAGNOSIS — I10 Essential (primary) hypertension: Secondary | ICD-10-CM | POA: Diagnosis not present

## 2021-10-14 DIAGNOSIS — E119 Type 2 diabetes mellitus without complications: Secondary | ICD-10-CM | POA: Diagnosis not present

## 2021-10-14 DIAGNOSIS — K219 Gastro-esophageal reflux disease without esophagitis: Secondary | ICD-10-CM | POA: Diagnosis not present

## 2021-10-14 DIAGNOSIS — E785 Hyperlipidemia, unspecified: Secondary | ICD-10-CM | POA: Diagnosis not present

## 2021-12-15 DIAGNOSIS — Z79899 Other long term (current) drug therapy: Secondary | ICD-10-CM | POA: Diagnosis not present

## 2021-12-15 DIAGNOSIS — E1129 Type 2 diabetes mellitus with other diabetic kidney complication: Secondary | ICD-10-CM | POA: Diagnosis not present

## 2021-12-15 DIAGNOSIS — I10 Essential (primary) hypertension: Secondary | ICD-10-CM | POA: Diagnosis not present

## 2021-12-15 DIAGNOSIS — E785 Hyperlipidemia, unspecified: Secondary | ICD-10-CM | POA: Diagnosis not present

## 2021-12-22 DIAGNOSIS — E1122 Type 2 diabetes mellitus with diabetic chronic kidney disease: Secondary | ICD-10-CM | POA: Diagnosis not present

## 2021-12-22 DIAGNOSIS — I10 Essential (primary) hypertension: Secondary | ICD-10-CM | POA: Diagnosis not present

## 2021-12-22 DIAGNOSIS — N1831 Chronic kidney disease, stage 3a: Secondary | ICD-10-CM | POA: Diagnosis not present

## 2021-12-22 DIAGNOSIS — R7309 Other abnormal glucose: Secondary | ICD-10-CM | POA: Diagnosis not present

## 2022-01-06 DIAGNOSIS — G4730 Sleep apnea, unspecified: Secondary | ICD-10-CM | POA: Diagnosis not present

## 2022-01-09 ENCOUNTER — Other Ambulatory Visit (HOSPITAL_COMMUNITY): Payer: Self-pay | Admitting: Internal Medicine

## 2022-01-09 DIAGNOSIS — Z1231 Encounter for screening mammogram for malignant neoplasm of breast: Secondary | ICD-10-CM

## 2022-02-02 DIAGNOSIS — H40053 Ocular hypertension, bilateral: Secondary | ICD-10-CM | POA: Diagnosis not present

## 2022-02-02 DIAGNOSIS — H25813 Combined forms of age-related cataract, bilateral: Secondary | ICD-10-CM | POA: Diagnosis not present

## 2022-02-13 ENCOUNTER — Ambulatory Visit (HOSPITAL_COMMUNITY)
Admission: RE | Admit: 2022-02-13 | Discharge: 2022-02-13 | Disposition: A | Discharge status: Home / Self Care | Payer: Medicare HMO | Source: Ambulatory Visit | Attending: Internal Medicine | Admitting: Internal Medicine

## 2022-02-13 DIAGNOSIS — Z1231 Encounter for screening mammogram for malignant neoplasm of breast: Secondary | ICD-10-CM | POA: Diagnosis not present

## 2022-03-13 ENCOUNTER — Ambulatory Visit: Payer: Medicare HMO | Admitting: Pulmonary Disease

## 2022-03-13 ENCOUNTER — Encounter: Payer: Self-pay | Admitting: Pulmonary Disease

## 2022-03-13 ENCOUNTER — Other Ambulatory Visit: Payer: Self-pay | Admitting: Internal Medicine

## 2022-03-13 VITALS — BP 134/76 | HR 62 | Temp 98.7°F | Ht 62.0 in | Wt 207.0 lb

## 2022-03-13 DIAGNOSIS — I10 Essential (primary) hypertension: Secondary | ICD-10-CM | POA: Diagnosis not present

## 2022-03-13 DIAGNOSIS — N1831 Chronic kidney disease, stage 3a: Secondary | ICD-10-CM | POA: Diagnosis not present

## 2022-03-13 DIAGNOSIS — Z79899 Other long term (current) drug therapy: Secondary | ICD-10-CM | POA: Diagnosis not present

## 2022-03-13 DIAGNOSIS — G4733 Obstructive sleep apnea (adult) (pediatric): Secondary | ICD-10-CM | POA: Diagnosis not present

## 2022-03-13 DIAGNOSIS — E1129 Type 2 diabetes mellitus with other diabetic kidney complication: Secondary | ICD-10-CM | POA: Diagnosis not present

## 2022-03-13 NOTE — Progress Notes (Signed)
Subjective:    Patient ID: Stephanie [name], female    DOB: 08/24/1950, 71 y.o.   MRN: [id number]  HPI  Chief Complaint  Patient presents with   Consult    Sleep consult. Dry mouth and wakes up with headaches. Home sleep study done 12/2021 (in referral paperwork)    71 year old referred for evaluation of sleep disordered breathing. She reported morning headaches to her PCP lasting about 30 minutes and dryness of mouth.  Her husband has not witnessed apneas. Epworth sleepiness score is 7. Bedtime is between 11 PM and midnight, sleep latency is minimal, she sleeps on her side with 1 pillow, reports 3-4 nocturnal awakenings including nocturia and is out of bed at 6:45 AM feeling somewhat rested with dryness of mouth and headaches. She has gained 25 pounds in the last few years. There are occasional naps in the recliner which are not refreshing    PMH -diabetes type 2 Hypertension Hyperlipidemia  Significant tests/ events reviewed  HST 12/2021  (SNAP ) >> 203 lbs , AH I 39/h, lowest desat 66%    Past Medical History:  Diagnosis Date   Diabetes mellitus without complication ([facility])    GERD (gastroesophageal reflux disease)    Hypercholesteremia    PONV (postoperative nausea and vomiting)    Past Surgical History:  Procedure Laterality Date   ABDOMINAL HYSTERECTOMY     BREAST BIOPSY Left    neg   COLONOSCOPY N/A 12/26/2012   Procedure: COLONOSCOPY;  Surgeon: Corbin Ade, MD;  Location: [facility];  Service: Endoscopy;  Laterality: N/A;  9:15 AM   TOTAL KNEE ARTHROPLASTY Left 03/04/2016   Procedure: LEFT TOTAL KNEE ARTHROPLASTY;  Surgeon: Vickki Hearing, MD;  Location: [facility];  Service: Orthopedics;  Laterality: Left;   TUBAL LIGATION      No Known Allergies  Social History   Socioeconomic History   Marital status: Married    Spouse name: Not on file   Number of children: Not on file   Years of education: Not on file   Highest education level: Not on  file  Occupational History   Not on file  Tobacco Use   Smoking status: Never   Smokeless tobacco: Never  Substance and Sexual Activity   Alcohol use: No   Drug use: No   Sexual activity: Not on file  Other Topics Concern   Not on file  Social History Narrative   Not on file   Social Determinants of Health   Financial Resource Strain: Not on file  Food Insecurity: Not on file  Transportation Needs: Not on file  Physical Activity: Not on file  Stress: Not on file  Social Connections: Not on file  Intimate Partner Violence: Not on file    Family History  Problem Relation Age of Onset   Colon cancer Neg Hx      Review of Systems Constitutional: negative for anorexia, fevers and sweats  Eyes: negative for irritation, redness and visual disturbance  Ears, nose, mouth, throat, and face: negative for earaches, epistaxis, nasal congestion and sore throat  Respiratory: negative for cough, dyspnea on exertion, sputum and wheezing  Cardiovascular: negative for chest pain, dyspnea, lower extremity edema, orthopnea, palpitations and syncope  Gastrointestinal: negative for abdominal pain, constipation, diarrhea, melena, nausea and vomiting  Genitourinary:negative for dysuria, frequency and hematuria  Hematologic/lymphatic: negative for bleeding, easy bruising and lymphadenopathy  Musculoskeletal:negative for arthralgias, muscle weakness and stiff joints  Neurological: negative for coordination problems, gait problems, headaches  and weakness  Endocrine: negative for diabetic symptoms including polydipsia, polyuria and weight loss     Objective:   Physical Exam   Gen. Pleasant, obese, in no distress, normal affect ENT - no pallor,icterus, no post nasal drip, class 2-3 airway Neck: No JVD, no thyromegaly, no carotid bruits Lungs: no use of accessory muscles, no dullness to percussion, decreased without rales or rhonchi  Cardiovascular: Rhythm regular, heart sounds  normal, no  murmurs or gallops, no peripheral edema Abdomen: soft and non-tender, no hepatosplenomegaly, BS normal. Musculoskeletal: No deformities, no cyanosis or clubbing Neuro:  alert, non focal, no tremors        Assessment & Plan:   Obesity -close correlation between OSA and obesity was emphasized

## 2022-03-13 NOTE — Assessment & Plan Note (Signed)
Surprisingly home sleep test which I reviewed with her shows severe OSA and desaturation.  I offered her formal attended titration study but she would prefer to initiate CPAP at home. I discussed treatment options.  CPAP will be initiated on auto settings 5 to 12 cm with nasal mask or pillows depending on her preference. We will bring her back after a month of usage and tweak settings as needed  Weight loss encouraged, compliance with goal of at least 4-6 hrs every night is the expectation. Advised against medications with sedative side effects Cautioned against driving when sleepy - understanding that sleepiness will vary on a day to day basis

## 2022-03-13 NOTE — Patient Instructions (Signed)
  You have severe OSA , stop breathing 39 times/ hour  X Rx for autoCPAP 5-12 cm nasal mask or pillows

## 2022-04-01 DIAGNOSIS — G4733 Obstructive sleep apnea (adult) (pediatric): Secondary | ICD-10-CM | POA: Diagnosis not present

## 2022-05-02 DIAGNOSIS — G4733 Obstructive sleep apnea (adult) (pediatric): Secondary | ICD-10-CM | POA: Diagnosis not present

## 2022-06-01 DIAGNOSIS — G4733 Obstructive sleep apnea (adult) (pediatric): Secondary | ICD-10-CM | POA: Diagnosis not present

## 2022-06-04 ENCOUNTER — Encounter: Payer: Self-pay | Admitting: Pulmonary Disease

## 2022-06-04 ENCOUNTER — Ambulatory Visit: Payer: Medicare HMO | Admitting: Pulmonary Disease

## 2022-06-04 DIAGNOSIS — G4733 Obstructive sleep apnea (adult) (pediatric): Secondary | ICD-10-CM

## 2022-06-04 NOTE — Progress Notes (Signed)
   Subjective:    Patient ID: Stephanie [name], female    DOB: March 07, 1951, 71 y.o.   MRN: [id number]  HPI  71 year old for follow-up of severe OSA  PMH -diabetes type 2 Hypertension Hyperlipidemia  Initial office visit 02/2022, be set up with auto CPAP 5-12 cm with nasal pillows.  She has adjusted well to this.  Feels like she sleeps a little harder, has more energy and less tired when she wakes up.  She has been using humidity.  No snoring has been noted by family members.  She reports feeling like she needs more pressure.  Mild dryness in the mornings   Significant tests/ events reviewed   HST 12/2021  (SNAP ) >> 203 lbs , AH I 39/h, lowest desat 66%  Review of Systems neg for any significant sore throat, dysphagia, itching, sneezing, nasal congestion or excess/ purulent secretions, fever, chills, sweats, unintended wt loss, pleuritic or exertional cp, hempoptysis, orthopnea pnd or change in chronic leg swelling. Also denies presyncope, palpitations, heartburn, abdominal pain, nausea, vomiting, diarrhea or change in bowel or urinary habits, dysuria,hematuria, rash, arthralgias, visual complaints, headache, numbness weakness or ataxia.     Objective:   Physical Exam  Gen. Pleasant, obese, in no distress ENT - no lesions, no post nasal drip Neck: No JVD, no thyromegaly, no carotid bruits Lungs: no use of accessory muscles, no dullness to percussion, decreased without rales or rhonchi  Cardiovascular: Rhythm regular, heart sounds  normal, no murmurs or gallops, no peripheral edema Musculoskeletal: No deformities, no cyanosis or clubbing , no tremors       Assessment & Plan:

## 2022-06-04 NOTE — Assessment & Plan Note (Signed)
CPAP download was reviewed which shows excellent control of events on 12 cm with no residual AHI, she has good compliance more than 7 hours every night without a single missed night.  She has settled down well with this and CPAP is clearly helping her daytime somnolence and fatigue. Pressure appears adequate so we will leave her on auto settings We discussed care of the machine and cardiovascular benefits  Weight loss encouraged, compliance with goal of at least 4-6 hrs every night is the expectation. Advised against medications with sedative side effects Cautioned against driving when sleepy - understanding that sleepiness will vary on a day to day basis

## 2022-06-04 NOTE — Patient Instructions (Signed)
  CPAP is working well  COngratulations ! You are adjusting well

## 2022-06-10 DIAGNOSIS — H25813 Combined forms of age-related cataract, bilateral: Secondary | ICD-10-CM | POA: Diagnosis not present

## 2022-06-10 DIAGNOSIS — H524 Presbyopia: Secondary | ICD-10-CM | POA: Diagnosis not present

## 2022-06-10 DIAGNOSIS — H1045 Other chronic allergic conjunctivitis: Secondary | ICD-10-CM | POA: Diagnosis not present

## 2022-06-10 DIAGNOSIS — H5203 Hypermetropia, bilateral: Secondary | ICD-10-CM | POA: Diagnosis not present

## 2022-06-10 DIAGNOSIS — H401134 Primary open-angle glaucoma, bilateral, indeterminate stage: Secondary | ICD-10-CM | POA: Diagnosis not present

## 2022-06-10 DIAGNOSIS — E119 Type 2 diabetes mellitus without complications: Secondary | ICD-10-CM | POA: Diagnosis not present

## 2022-06-10 DIAGNOSIS — Z0100 Encounter for examination of eyes and vision without abnormal findings: Secondary | ICD-10-CM | POA: Diagnosis not present

## 2022-07-01 DIAGNOSIS — G4733 Obstructive sleep apnea (adult) (pediatric): Secondary | ICD-10-CM | POA: Diagnosis not present

## 2022-07-02 DIAGNOSIS — G4733 Obstructive sleep apnea (adult) (pediatric): Secondary | ICD-10-CM | POA: Diagnosis not present

## 2022-07-03 DIAGNOSIS — Z23 Encounter for immunization: Secondary | ICD-10-CM | POA: Diagnosis not present

## 2022-07-06 DIAGNOSIS — Z79899 Other long term (current) drug therapy: Secondary | ICD-10-CM | POA: Diagnosis not present

## 2022-07-06 DIAGNOSIS — K760 Fatty (change of) liver, not elsewhere classified: Secondary | ICD-10-CM | POA: Diagnosis not present

## 2022-07-06 DIAGNOSIS — E039 Hypothyroidism, unspecified: Secondary | ICD-10-CM | POA: Diagnosis not present

## 2022-07-06 DIAGNOSIS — N1831 Chronic kidney disease, stage 3a: Secondary | ICD-10-CM | POA: Diagnosis not present

## 2022-07-06 DIAGNOSIS — E785 Hyperlipidemia, unspecified: Secondary | ICD-10-CM | POA: Diagnosis not present

## 2022-07-06 DIAGNOSIS — E559 Vitamin D deficiency, unspecified: Secondary | ICD-10-CM | POA: Diagnosis not present

## 2022-07-06 DIAGNOSIS — K219 Gastro-esophageal reflux disease without esophagitis: Secondary | ICD-10-CM | POA: Diagnosis not present

## 2022-07-06 DIAGNOSIS — M1990 Unspecified osteoarthritis, unspecified site: Secondary | ICD-10-CM | POA: Diagnosis not present

## 2022-07-06 DIAGNOSIS — E1129 Type 2 diabetes mellitus with other diabetic kidney complication: Secondary | ICD-10-CM | POA: Diagnosis not present

## 2022-07-06 DIAGNOSIS — I10 Essential (primary) hypertension: Secondary | ICD-10-CM | POA: Diagnosis not present

## 2022-07-16 DIAGNOSIS — H401131 Primary open-angle glaucoma, bilateral, mild stage: Secondary | ICD-10-CM | POA: Diagnosis not present

## 2022-07-17 DIAGNOSIS — N1831 Chronic kidney disease, stage 3a: Secondary | ICD-10-CM | POA: Diagnosis not present

## 2022-07-17 DIAGNOSIS — E039 Hypothyroidism, unspecified: Secondary | ICD-10-CM | POA: Diagnosis not present

## 2022-07-17 DIAGNOSIS — I10 Essential (primary) hypertension: Secondary | ICD-10-CM | POA: Diagnosis not present

## 2022-07-17 DIAGNOSIS — E1122 Type 2 diabetes mellitus with diabetic chronic kidney disease: Secondary | ICD-10-CM | POA: Diagnosis not present

## 2022-07-17 DIAGNOSIS — I480 Paroxysmal atrial fibrillation: Secondary | ICD-10-CM | POA: Diagnosis not present

## 2022-08-01 DIAGNOSIS — G4733 Obstructive sleep apnea (adult) (pediatric): Secondary | ICD-10-CM | POA: Diagnosis not present

## 2022-08-31 DIAGNOSIS — H40053 Ocular hypertension, bilateral: Secondary | ICD-10-CM | POA: Diagnosis not present

## 2022-08-31 DIAGNOSIS — H25813 Combined forms of age-related cataract, bilateral: Secondary | ICD-10-CM | POA: Diagnosis not present

## 2022-09-01 DIAGNOSIS — G4733 Obstructive sleep apnea (adult) (pediatric): Secondary | ICD-10-CM | POA: Diagnosis not present

## 2022-09-29 DIAGNOSIS — G4733 Obstructive sleep apnea (adult) (pediatric): Secondary | ICD-10-CM | POA: Diagnosis not present

## 2022-10-02 DIAGNOSIS — G4733 Obstructive sleep apnea (adult) (pediatric): Secondary | ICD-10-CM | POA: Diagnosis not present

## 2022-10-12 DIAGNOSIS — I10 Essential (primary) hypertension: Secondary | ICD-10-CM | POA: Diagnosis not present

## 2022-10-12 DIAGNOSIS — N1831 Chronic kidney disease, stage 3a: Secondary | ICD-10-CM | POA: Diagnosis not present

## 2022-10-12 DIAGNOSIS — E1129 Type 2 diabetes mellitus with other diabetic kidney complication: Secondary | ICD-10-CM | POA: Diagnosis not present

## 2022-10-15 ENCOUNTER — Encounter: Payer: Self-pay | Admitting: Radiology

## 2022-10-31 DIAGNOSIS — G4733 Obstructive sleep apnea (adult) (pediatric): Secondary | ICD-10-CM | POA: Diagnosis not present

## 2022-11-25 ENCOUNTER — Encounter: Payer: Self-pay | Admitting: *Deleted

## 2022-12-01 DIAGNOSIS — G4733 Obstructive sleep apnea (adult) (pediatric): Secondary | ICD-10-CM | POA: Diagnosis not present

## 2022-12-04 ENCOUNTER — Ambulatory Visit: Payer: Medicare HMO | Admitting: Pulmonary Disease

## 2022-12-16 ENCOUNTER — Encounter: Payer: Self-pay | Admitting: Pulmonary Disease

## 2022-12-16 ENCOUNTER — Ambulatory Visit: Payer: Medicare HMO | Admitting: Pulmonary Disease

## 2022-12-16 VITALS — BP 124/68 | HR 89 | Ht 62.0 in | Wt 211.0 lb

## 2022-12-16 DIAGNOSIS — G4733 Obstructive sleep apnea (adult) (pediatric): Secondary | ICD-10-CM

## 2022-12-16 DIAGNOSIS — I10 Essential (primary) hypertension: Secondary | ICD-10-CM | POA: Diagnosis not present

## 2022-12-16 NOTE — Assessment & Plan Note (Signed)
CPAP download was reviewed which shows excellent control of events on auto settings 5 to 12 cm with average pressure of 12 and max pressure 12 cm.  She is very compliant.  CPAP is only helped improve her daytime somnolence and fatigue. CPAP supplies will be renewed for a year Weight loss encouraged, compliance with goal of at least 4-6 hrs every night is the expectation. Advised against medications with sedative side effects Cautioned against driving when sleepy - understanding that sleepiness will vary on a day to day basis

## 2022-12-16 NOTE — Patient Instructions (Signed)
CPAP is working well °Supplies will be renewed x  1year ° °

## 2022-12-16 NOTE — Progress Notes (Signed)
   Subjective:    Patient ID: Stephanie [name], female    DOB: [birth year], 72 y.o.   MRN: [id number]  HPI  72 yo for follow-up of severe OSA   PMH -diabetes type 2 Hypertension Hyperlipidemia   Initial office visit 02/2022, set up with auto CPAP 5-12 cm with nasal pillows.  6 month FU visit She has settled down with CPAP and is using nasal pillows. She is getting her supplies on time.  She denies any problems with mask or pressure. She reports increased energy and feeling rested in the mornings when she wakes up   Significant tests/ events reviewed  HST 12/2021  (SNAP ) >> 203 lbs , AH I 39/h, lowest desat 66%   Review of Systems    neg for any significant sore throat, dysphagia, itching, sneezing, nasal congestion or excess/ purulent secretions, fever, chills, sweats, unintended wt loss, pleuritic or exertional cp, hempoptysis, orthopnea pnd or change in chronic leg swelling. Also denies presyncope, palpitations, heartburn, abdominal pain, nausea, vomiting, diarrhea or change in bowel or urinary habits, dysuria,hematuria, rash, arthralgias, visual complaints, headache, numbness weakness or ataxia.  Objective:   Physical Exam  Gen. Pleasant, obese, in no distress ENT - no lesions, no post nasal drip Neck: No JVD, no thyromegaly, no carotid bruits Lungs: no use of accessory muscles, no dullness to percussion, decreased without rales or rhonchi  Cardiovascular: Rhythm regular, heart sounds  normal, no murmurs or gallops, no peripheral edema Musculoskeletal: No deformities, no cyanosis or clubbing , no tremors        Assessment & Plan:

## 2022-12-16 NOTE — Assessment & Plan Note (Signed)
Well-controlled on 2 medications 

## 2022-12-17 ENCOUNTER — Telehealth: Payer: Self-pay | Admitting: *Deleted

## 2022-12-17 NOTE — Telephone Encounter (Signed)
  Procedure: Colonoscopy  Estimated body mass index is 38.59 kg/m as calculated from the following:   Height as of 12/16/22: 5\' 2"  (1.575 m).   Weight as of 12/16/22: 211 lb (95.7 kg).   Have you had a colonoscopy before?  Yes 12/26/12, Dr. Jena Gauss  Do you have family history of colon cancer?  no  Do you have a family history of polyps? no  Previous colonoscopy with polyps removed? no  Do you have a history colorectal cancer?   no  Are you diabetic?  Yes, type   Do you have a prosthetic or mechanical heart valve? no  Do you have a pacemaker/defibrillator?   no  Have you had endocarditis/atrial fibrillation?  no  Do you use supplemental oxygen/CPAP?  yes cpap  Have you had joint replacement within the last 12 months?  no  Do you tend to be constipated or have to use laxatives?  no   Do you have history of alcohol use? If yes, how much and how often.  no  Do you have history or are you using drugs? If yes, what do are you  using?  no  Have you ever had a stroke/heart attack?    Have you ever had a heart or other vascular stent placed,?  Do you take weight loss medication? no  female patients,: have you had a hysterectomy? yes                              are you post menopausal?  no                              do you still have your menstrual cycle? no    Date of last menstrual period?   Do you take any blood-thinning medications such as: (Plavix, aspirin, Coumadin, Aggrenox, Brilinta, Xarelto, Eliquis, Pradaxa, Savaysa or Effient)? Aspirin 81mg   If yes we need the name, milligram, dosage and who is prescribing doctor:               Current Outpatient Medications  Medication Sig Dispense Refill   aspirin EC 81 MG tablet Take 81 mg by mouth daily. Swallow whole.     hydrochlorothiazide (HYDRODIURIL) 25 MG tablet Take 25 mg by mouth daily.     latanoprost (XALATAN) 0.005 % ophthalmic solution INSTILL 1 DROP INTO EACH EYE EVERY DAY AT BEDTIME     levothyroxine (SYNTHROID,  LEVOTHROID) 75 MCG tablet Take 75 mcg by mouth daily before breakfast.     losartan (COZAAR) 100 MG tablet Take 100 mg by mouth daily.     metFORMIN (GLUCOPHAGE) 1000 MG tablet Take 1,000 mg by mouth 2 (two) times daily with a meal.     pioglitazone (ACTOS) 15 MG tablet Take 15 mg by mouth daily.     pravastatin (PRAVACHOL) 40 MG tablet Take 40 mg by mouth daily.     ranitidine (ZANTAC) 150 MG tablet Take 150 mg by mouth daily as needed for heartburn. Only occasionally     No current facility-administered medications for this visit.    No Known Allergies

## 2022-12-20 NOTE — Telephone Encounter (Signed)
Previous ASA 3. History of OSA and diabetes. Needs OV.

## 2022-12-21 NOTE — Telephone Encounter (Signed)
Questionnaire from recall, no referral needed  

## 2022-12-31 DIAGNOSIS — G4733 Obstructive sleep apnea (adult) (pediatric): Secondary | ICD-10-CM | POA: Diagnosis not present

## 2023-01-12 DIAGNOSIS — I10 Essential (primary) hypertension: Secondary | ICD-10-CM | POA: Diagnosis not present

## 2023-01-12 DIAGNOSIS — N1832 Chronic kidney disease, stage 3b: Secondary | ICD-10-CM | POA: Diagnosis not present

## 2023-01-12 DIAGNOSIS — E1129 Type 2 diabetes mellitus with other diabetic kidney complication: Secondary | ICD-10-CM | POA: Diagnosis not present

## 2023-01-19 DIAGNOSIS — M1990 Unspecified osteoarthritis, unspecified site: Secondary | ICD-10-CM | POA: Diagnosis not present

## 2023-01-19 DIAGNOSIS — I10 Essential (primary) hypertension: Secondary | ICD-10-CM | POA: Diagnosis not present

## 2023-01-19 DIAGNOSIS — N1831 Chronic kidney disease, stage 3a: Secondary | ICD-10-CM | POA: Diagnosis not present

## 2023-01-19 DIAGNOSIS — E1122 Type 2 diabetes mellitus with diabetic chronic kidney disease: Secondary | ICD-10-CM | POA: Diagnosis not present

## 2023-01-29 ENCOUNTER — Encounter: Payer: Self-pay | Admitting: Internal Medicine

## 2023-01-29 ENCOUNTER — Ambulatory Visit: Payer: Medicare HMO | Admitting: Internal Medicine

## 2023-01-29 VITALS — BP 117/75 | HR 92 | Temp 98.0°F | Ht 62.0 in | Wt 211.2 lb

## 2023-01-29 DIAGNOSIS — G4733 Obstructive sleep apnea (adult) (pediatric): Secondary | ICD-10-CM | POA: Diagnosis not present

## 2023-01-29 DIAGNOSIS — Z1211 Encounter for screening for malignant neoplasm of colon: Secondary | ICD-10-CM

## 2023-01-29 NOTE — Patient Instructions (Signed)
It was good to see you again today!  As discussed, will schedule a screening colonoscopy (ASA 3) in the near future.  Hold diabetic medications per protocol.

## 2023-01-29 NOTE — Progress Notes (Unsigned)
Primary Care Physician:  Carylon Perches, MD Primary Gastroenterologist:  Dr. Jena Gauss  Pre-Procedure History & Physical: HPI:  Stephanie [name] is a 72 y.o. female here for for consideration of average rescreening colonoscopy.  Negative colonoscopy 10 years ago.  No bowel symptoms.  No family history of colon cancer.  Now has sleep apnea: Uses CPAP.  Past Medical History:  Diagnosis Date   Diabetes mellitus without complication (HCC)    GERD (gastroesophageal reflux disease)    Hypercholesteremia    PONV (postoperative nausea and vomiting)     Past Surgical History:  Procedure Laterality Date   ABDOMINAL HYSTERECTOMY     BREAST BIOPSY Left    neg   COLONOSCOPY N/A 12/26/2012   Procedure: COLONOSCOPY;  Surgeon: Corbin Ade, MD;  Location: [facility];  Service: Endoscopy;  Laterality: N/A;  9:15 AM   TOTAL KNEE ARTHROPLASTY Left 03/04/2016   Procedure: LEFT TOTAL KNEE ARTHROPLASTY;  Surgeon: Vickki Hearing, MD;  Location: [facility];  Service: Orthopedics;  Laterality: Left;   TUBAL LIGATION      Prior to Admission medications   Medication Sig Start Date End Date Taking? Authorizing Provider  aspirin EC 81 MG tablet Take 81 mg by mouth daily. Swallow whole.   Yes [provider]  Cholecalciferol (VITAMIN D3) 50 MCG (2000 UT) capsule Take 2,000 Units by mouth daily.   Yes [provider]  hydrochlorothiazide (HYDRODIURIL) 25 MG tablet Take 25 mg by mouth daily.   Yes [provider]  latanoprost (XALATAN) 0.005 % ophthalmic solution INSTILL 1 DROP INTO EACH EYE EVERY DAY AT BEDTIME 01/28/19  Yes [provider]  levothyroxine (SYNTHROID, LEVOTHROID) 75 MCG tablet Take 75 mcg by mouth daily before breakfast.   Yes [provider]  losartan (COZAAR) 100 MG tablet Take 100 mg by mouth daily. 03/28/21  Yes [provider]  metFORMIN (GLUCOPHAGE) 1000 MG tablet Take 1,000 mg by mouth 2 (two) times daily with a meal.   Yes [provider]  pioglitazone (ACTOS) 15 MG tablet Take 15 mg by mouth daily. 12/11/18  Yes [provider]  pravastatin (PRAVACHOL) 40 MG tablet Take 40 mg by mouth daily.   Yes [provider]  ranitidine (ZANTAC) 150 MG tablet Take 150 mg by mouth daily as needed for heartburn. Only occasionally   Yes [provider]    Allergies as of 01/29/2023   (No Known Allergies)    Family History  Problem Relation Age of Onset   Colon cancer Neg Hx     Social History   Socioeconomic History   Marital status: Married    Spouse name: Not on file   Number of children: Not on file   Years of education: Not on file   Highest education level: Not on file  Occupational History   Not on file  Tobacco Use   Smoking status: Never   Smokeless tobacco: Never  Substance and Sexual Activity   Alcohol use: No   Drug use: No   Sexual activity: Not on file  Other Topics Concern   Not on file  Social History Narrative   Not on file   Social Determinants of Health   Financial Resource Strain: Not on file  Food Insecurity: Not on file  Transportation Needs: Not on file  Physical Activity: Not on file  Stress: Not on file  Social Connections: Not on file  Intimate Partner Violence: Not on file    Review  of Systems: See HPI, otherwise negative ROS  Physical Exam: BP 117/75 (BP Location: Right Arm, Patient Position: Sitting, Cuff Size: Large)   Pulse 92   Temp 98 F (36.7 C) (Oral)   Ht 5\' 2"  (1.575 m)   Wt 211 lb 3.2 oz (95.8 kg)   SpO2 96%   BMI 38.63 kg/m  General:   Alert,   pleasant and cooperative in NAD Neck:  Supple; no masses or thyromegaly. No significant cervical adenopathy. Lungs:  Clear throughout to auscultation.   No wheezes, crackles, or rhonchi. No acute distress. Heart:  Regular rate and rhythm; no murmurs, clicks, rubs,  or gallops. Abdomen: Non-distended, normal bowel sounds.  Soft and nontender without appreciable mass or  hepatosplenomegaly.   Impression/Plan: Pleasant 72 year old lady with OSA on CPAP returns for setting up an average or screening colonoscopy.  No bowel symptoms.  No family history.  Negative colonoscopy 10 years ago.  I have offered the patient an average risk screening colonoscopy in the near future. The risks, benefits, limitations, alternatives and imponderables have been reviewed with the patient. Questions have been answered. All parties are agreeable.   Will hold diabetic meds per protocol.    Notice: This dictation was prepared with Dragon dictation along with smaller phrase technology. Any transcriptional errors that result from this process are unintentional and may not be corrected upon review.

## 2023-01-31 DIAGNOSIS — G4733 Obstructive sleep apnea (adult) (pediatric): Secondary | ICD-10-CM | POA: Diagnosis not present

## 2023-02-01 ENCOUNTER — Other Ambulatory Visit (HOSPITAL_COMMUNITY): Payer: Self-pay | Admitting: Internal Medicine

## 2023-02-01 DIAGNOSIS — Z1231 Encounter for screening mammogram for malignant neoplasm of breast: Secondary | ICD-10-CM

## 2023-02-17 ENCOUNTER — Encounter (HOSPITAL_COMMUNITY): Payer: Self-pay

## 2023-02-17 ENCOUNTER — Ambulatory Visit (HOSPITAL_COMMUNITY)
Admission: RE | Admit: 2023-02-17 | Discharge: 2023-02-17 | Disposition: A | Discharge status: Home / Self Care | Payer: Medicare HMO | Source: Ambulatory Visit | Attending: Internal Medicine | Admitting: Internal Medicine

## 2023-02-17 DIAGNOSIS — Z1231 Encounter for screening mammogram for malignant neoplasm of breast: Secondary | ICD-10-CM | POA: Insufficient documentation

## 2023-03-01 DIAGNOSIS — H25813 Combined forms of age-related cataract, bilateral: Secondary | ICD-10-CM | POA: Diagnosis not present

## 2023-03-01 DIAGNOSIS — H40053 Ocular hypertension, bilateral: Secondary | ICD-10-CM | POA: Diagnosis not present

## 2023-03-02 ENCOUNTER — Telehealth: Payer: Self-pay | Admitting: *Deleted

## 2023-03-02 DIAGNOSIS — G4733 Obstructive sleep apnea (adult) (pediatric): Secondary | ICD-10-CM | POA: Diagnosis not present

## 2023-03-02 NOTE — Telephone Encounter (Signed)
Called pt, LMOVM to call back to schedule TCS with Dr. Jena Gauss, ASA 3

## 2023-03-03 ENCOUNTER — Telehealth: Payer: Self-pay | Admitting: Internal Medicine

## 2023-03-03 NOTE — Telephone Encounter (Signed)
Patient left a message saying she was returning a calll

## 2023-03-08 NOTE — Telephone Encounter (Signed)
Pt left vm returning call.  Spoke with pt ,offered to schedule her with another provider but  she wants to wait until September for Dr. Jena Gauss. Advised pt will give her a call once we get providers September schedule.

## 2023-03-26 ENCOUNTER — Encounter: Payer: Self-pay | Admitting: *Deleted

## 2023-03-26 MED ORDER — PEG 3350-KCL-NA BICARB-NACL 420 G PO SOLR: 4000.0000 mL | Freq: Once | ORAL | 0 refills | Status: AC

## 2023-03-26 NOTE — Telephone Encounter (Signed)
Spoke with pt. She has been scheduled for 9/9. Aware will call back with pre-op appt. Rx for prep to be sent to [facility]. Instructions mailed.

## 2023-03-26 NOTE — Addendum Note (Signed)
Addended by: Armstead Peaks on: 03/26/2023 08:56 AM   Modules accepted: Orders

## 2023-03-29 ENCOUNTER — Encounter (INDEPENDENT_AMBULATORY_CARE_PROVIDER_SITE_OTHER): Payer: Self-pay | Admitting: *Deleted

## 2023-04-20 NOTE — Patient Instructions (Signed)
YAKIMA LIDE  04/20/2023     @PREFPERIOPPHARMACY @   Your procedure is scheduled on  04/26/2023.   Report to [facility] at  0730  A.M.   Call this number if you have problems the morning of surgery:  [phone]  If you experience any cold or flu symptoms such as cough, fever, chills, shortness of breath, etc. between now and your scheduled surgery, please notify us at the above number.   Remember:  Follow the diet and prep instructions given to you by the office.   DO NOT take any medications for diabetes the morning of your procedure.    Take these medicines the morning of surgery with A SIP OF WATER                                levothyroxine, zantac.     Do not wear jewelry, make-up or nail polish, including gel polish,  artificial nails, or any other type of covering on natural nails (fingers and  toes).  Do not wear lotions, powders, or perfumes, or deodorant.  Do not shave 48 hours prior to surgery.  Men may shave face and neck.  Do not bring valuables to the hospital.  [facility] is not responsible for any belongings or valuables.  Contacts, dentures or bridgework may not be worn into surgery.  Leave your suitcase in the car.  After surgery it may be brought to your room.  For patients admitted to the hospital, discharge time will be determined by your treatment team.  Patients discharged the day of surgery will not be allowed to drive home and must have someone with them for 24 hours.    Special instructions:   DO NOT smoke tobacco or vape for 24 hours before your procedure.  Please read over the following fact sheets that you were given. Anesthesia Post-op Instructions and Care and Recovery After Surgery      Colonoscopy, Adult, Care After The following information offers guidance on how to care for yourself after your procedure. Your health care provider may also give you more specific instructions. If you have problems or questions,  contact your health care provider. What can I expect after the procedure? After the procedure, it is common to have: A small amount of blood in your stool for 24 hours after the procedure. Some gas. Mild cramping or bloating of your abdomen. Follow these instructions at home: Eating and drinking  Drink enough fluid to keep your urine pale yellow. Follow instructions from your health care provider about eating or drinking restrictions. Resume your normal diet as told by your health care provider. Avoid heavy or fried foods that are hard to digest. Activity Rest as told by your health care provider. Avoid sitting for a long time without moving. Get up to take short walks every 1-2 hours. This is important to improve blood flow and breathing. Ask for help if you feel weak or unsteady. Return to your normal activities as told by your health care provider. Ask your health care provider what activities are safe for you. Managing cramping and bloating  Try walking around when you have cramps or feel bloated. If directed, apply heat to your abdomen as told by your health care provider. Use the heat source that your health care provider recommends, such as a moist heat pack or a heating pad. Place a towel between your skin  and the heat source. Leave the heat on for 20-30 minutes. Remove the heat if your skin turns bright red. This is especially important if you are unable to feel pain, heat, or cold. You have a greater risk of getting burned. General instructions If you were given a sedative during the procedure, it can affect you for several hours. Do not drive or operate machinery until your health care provider says that it is safe. For the first 24 hours after the procedure: Do not sign important documents. Do not drink alcohol. Do your regular daily activities at a slower pace than normal. Eat soft foods that are easy to digest. Take over-the-counter and prescription medicines only as told  by your health care provider. Keep all follow-up visits. This is important. Contact a health care provider if: You have blood in your stool 2-3 days after the procedure. Get help right away if: You have more than a small spotting of blood in your stool. You have large blood clots in your stool. You have swelling of your abdomen. You have nausea or vomiting. You have a fever. You have increasing pain in your abdomen that is not relieved with medicine. These symptoms may be an emergency. Get help right away. Call 911. Do not wait to see if the symptoms will go away. Do not drive yourself to the hospital. Summary After the procedure, it is common to have a small amount of blood in your stool. You may also have mild cramping and bloating of your abdomen. If you were given a sedative during the procedure, it can affect you for several hours. Do not drive or operate machinery until your health care provider says that it is safe. Get help right away if you have a lot of blood in your stool, nausea or vomiting, a fever, or increased pain in your abdomen. This information is not intended to replace advice given to you by your health care provider. Make sure you discuss any questions you have with your health care provider. Document Revised: 09/15/2022 Document Reviewed: 03/26/2021 Elsevier Patient Education  2024 [employer] Monitored Anesthesia Care, Care After The following information offers guidance on how to care for yourself after your procedure. Your health care provider may also give you more specific instructions. If you have problems or questions, contact your health care provider. What can I expect after the procedure? After the procedure, it is common to have: Tiredness. Little or no memory about what happened during or after the procedure. Impaired judgment when it comes to making decisions. Nausea or vomiting. Some trouble with balance. Follow these instructions at home: For  the time period you were told by your health care provider:  Rest. Do not participate in activities where you could fall or become injured. Do not drive or use machinery. Do not drink alcohol. Do not take sleeping pills or medicines that cause drowsiness. Do not make important decisions or sign legal documents. Do not take care of children on your own. Medicines Take over-the-counter and prescription medicines only as told by your health care provider. If you were prescribed antibiotics, take them as told by your health care provider. Do not stop using the antibiotic even if you start to feel better. Eating and drinking Follow instructions from your health care provider about what you may eat and drink. Drink enough fluid to keep your urine pale yellow. If you vomit: Drink clear fluids slowly and in small amounts as you are able. Clear fluids include water,  ice chips, low-calorie sports drinks, and fruit juice that has water added to it (diluted fruit juice). Eat light and bland foods in small amounts as you are able. These foods include bananas, applesauce, rice, lean meats, toast, and crackers. General instructions  Have a responsible adult stay with you for the time you are told. It is important to have someone help care for you until you are awake and alert. If you have sleep apnea, surgery and some medicines can increase your risk for breathing problems. Follow instructions from your health care provider about wearing your sleep device: When you are sleeping. This includes during daytime naps. While taking prescription pain medicines, sleeping medicines, or medicines that make you drowsy. Do not use any products that contain nicotine or tobacco. These products include cigarettes, chewing tobacco, and vaping devices, such as e-cigarettes. If you need help quitting, ask your health care provider. Contact a health care provider if: You feel nauseous or vomit every time you eat or  drink. You feel light-headed. You are still sleepy or having trouble with balance after 24 hours. You get a rash. You have a fever. You have redness or swelling around the IV site. Get help right away if: You have trouble breathing. You have new confusion after you get home. These symptoms may be an emergency. Get help right away. Call 911. Do not wait to see if the symptoms will go away. Do not drive yourself to the hospital. This information is not intended to replace advice given to you by your health care provider. Make sure you discuss any questions you have with your health care provider. Document Revised: 12/29/2021 Document Reviewed: 12/29/2021 Elsevier Patient Education  2024 [employer].

## 2023-04-21 ENCOUNTER — Encounter (HOSPITAL_COMMUNITY): Payer: Self-pay

## 2023-04-21 ENCOUNTER — Encounter (HOSPITAL_COMMUNITY)
Admission: RE | Admit: 2023-04-21 | Discharge: 2023-04-21 | Disposition: A | Discharge status: Home / Self Care | Payer: Medicare HMO | Source: Ambulatory Visit | Attending: Internal Medicine | Admitting: Internal Medicine

## 2023-04-21 VITALS — BP 132/88 | HR 84 | Temp 97.8°F | Resp 18 | Ht 62.0 in | Wt 211.2 lb

## 2023-04-21 DIAGNOSIS — Z01812 Encounter for preprocedural laboratory examination: Secondary | ICD-10-CM | POA: Insufficient documentation

## 2023-04-21 DIAGNOSIS — N1832 Chronic kidney disease, stage 3b: Secondary | ICD-10-CM | POA: Diagnosis not present

## 2023-04-21 DIAGNOSIS — E1129 Type 2 diabetes mellitus with other diabetic kidney complication: Secondary | ICD-10-CM | POA: Diagnosis not present

## 2023-04-21 DIAGNOSIS — Z01818 Encounter for other preprocedural examination: Secondary | ICD-10-CM | POA: Diagnosis not present

## 2023-04-21 DIAGNOSIS — I10 Essential (primary) hypertension: Secondary | ICD-10-CM | POA: Diagnosis not present

## 2023-04-21 DIAGNOSIS — Z79899 Other long term (current) drug therapy: Secondary | ICD-10-CM | POA: Diagnosis not present

## 2023-04-21 DIAGNOSIS — E119 Type 2 diabetes mellitus without complications: Secondary | ICD-10-CM | POA: Diagnosis not present

## 2023-04-21 DIAGNOSIS — Z01810 Encounter for preprocedural cardiovascular examination: Secondary | ICD-10-CM | POA: Diagnosis not present

## 2023-04-21 DIAGNOSIS — M1990 Unspecified osteoarthritis, unspecified site: Secondary | ICD-10-CM | POA: Diagnosis not present

## 2023-04-21 HISTORY — DX: Essential (primary) hypertension: I10

## 2023-04-21 HISTORY — DX: Sleep apnea, unspecified: G47.30

## 2023-04-21 HISTORY — DX: Hypothyroidism, unspecified: E03.9

## 2023-04-21 LAB — BASIC METABOLIC PANEL
Anion gap: 14 (ref 5–15)
BUN: 22 mg/dL (ref 8–23)
CO2: 24 mmol/L (ref 22–32)
Calcium: 9.8 mg/dL (ref 8.9–10.3)
Chloride: 99 mmol/L (ref 98–111)
Creatinine, Ser: 1.2 mg/dL — ABNORMAL HIGH (ref 0.44–1.00)
GFR, Estimated: 48 mL/min — ABNORMAL LOW (ref >60)
Glucose, Bld: 111 mg/dL — ABNORMAL HIGH (ref 70–99)
Potassium: 3.8 mmol/L (ref 3.5–5.1)
Sodium: 137 mmol/L (ref 135–145)

## 2023-04-26 ENCOUNTER — Encounter (HOSPITAL_COMMUNITY): Payer: Self-pay | Admitting: Internal Medicine

## 2023-04-26 ENCOUNTER — Ambulatory Visit (HOSPITAL_BASED_OUTPATIENT_CLINIC_OR_DEPARTMENT_OTHER): Payer: Medicare HMO | Admitting: Certified Registered"

## 2023-04-26 ENCOUNTER — Encounter (HOSPITAL_COMMUNITY)
Admission: RE | Disposition: A | Discharge status: Home / Self Care | Payer: Self-pay | Source: Home / Self Care | Attending: Internal Medicine

## 2023-04-26 ENCOUNTER — Ambulatory Visit (HOSPITAL_COMMUNITY): Payer: Medicare HMO | Admitting: Certified Registered"

## 2023-04-26 ENCOUNTER — Ambulatory Visit (HOSPITAL_COMMUNITY)
Admission: RE | Admit: 2023-04-26 | Discharge: 2023-04-26 | Disposition: A | Discharge status: Home / Self Care | Payer: Medicare HMO | Attending: Internal Medicine | Admitting: Internal Medicine

## 2023-04-26 DIAGNOSIS — K219 Gastro-esophageal reflux disease without esophagitis: Secondary | ICD-10-CM | POA: Diagnosis not present

## 2023-04-26 DIAGNOSIS — Z1211 Encounter for screening for malignant neoplasm of colon: Secondary | ICD-10-CM

## 2023-04-26 DIAGNOSIS — Z7984 Long term (current) use of oral hypoglycemic drugs: Secondary | ICD-10-CM | POA: Diagnosis not present

## 2023-04-26 DIAGNOSIS — G4730 Sleep apnea, unspecified: Secondary | ICD-10-CM | POA: Insufficient documentation

## 2023-04-26 DIAGNOSIS — I10 Essential (primary) hypertension: Secondary | ICD-10-CM | POA: Insufficient documentation

## 2023-04-26 DIAGNOSIS — E039 Hypothyroidism, unspecified: Secondary | ICD-10-CM | POA: Diagnosis not present

## 2023-04-26 DIAGNOSIS — Z6838 Body mass index (BMI) 38.0-38.9, adult: Secondary | ICD-10-CM | POA: Insufficient documentation

## 2023-04-26 HISTORY — PX: COLONOSCOPY WITH PROPOFOL: SHX5780

## 2023-04-26 LAB — GLUCOSE, CAPILLARY: Glucose-Capillary: 99 mg/dL (ref 70–99)

## 2023-04-26 SURGERY — COLONOSCOPY WITH PROPOFOL
Anesthesia: General

## 2023-04-26 MED ORDER — PROPOFOL 500 MG/50ML IV EMUL
INTRAVENOUS | Status: DC | PRN
  Administered 2023-04-26: 150 ug/kg/min via INTRAVENOUS

## 2023-04-26 MED ORDER — PROPOFOL 10 MG/ML IV BOLUS
INTRAVENOUS | Status: DC | PRN
Start: 2023-04-26 — End: 2023-04-26
  Administered 2023-04-26: 50 mg via INTRAVENOUS
  Administered 2023-04-26: 100 mg via INTRAVENOUS

## 2023-04-26 MED ORDER — LACTATED RINGERS IV SOLN: INTRAVENOUS | Status: DC

## 2023-04-26 MED ORDER — LIDOCAINE HCL (CARDIAC) PF 100 MG/5ML IV SOSY
PREFILLED_SYRINGE | INTRAVENOUS | Status: DC | PRN
  Administered 2023-04-26: 50 mg via INTRAVENOUS

## 2023-04-26 MED ORDER — PHENYLEPHRINE 80 MCG/ML (10ML) SYRINGE FOR IV PUSH (FOR BLOOD PRESSURE SUPPORT)
PREFILLED_SYRINGE | INTRAVENOUS | Status: AC
  Filled 2023-04-26: qty 10

## 2023-04-26 MED ORDER — PHENYLEPHRINE 80 MCG/ML (10ML) SYRINGE FOR IV PUSH (FOR BLOOD PRESSURE SUPPORT)
PREFILLED_SYRINGE | INTRAVENOUS | Status: DC | PRN
Start: 2023-04-26 — End: 2023-04-26
  Administered 2023-04-26 (×4): 160 ug via INTRAVENOUS

## 2023-04-26 NOTE — Transfer of Care (Signed)
Immediate Anesthesia Transfer of Care Note  Patient: [name]  Procedure(s) Performed: COLONOSCOPY WITH PROPOFOL  Patient Location: Short Stay  Anesthesia Type:General  Level of Consciousness: awake  Airway & Oxygen Therapy: Patient Spontanous Breathing  Post-op Assessment: Report given to RN and Post -op Vital signs reviewed and stable  Post vital signs: Reviewed and stable  Last Vitals:  Vitals Value Taken Time  BP 84/47 04/26/23 1008  Temp    Pulse 86 04/26/23 1010  Resp 21 04/26/23 1010  SpO2 97 % 04/26/23 1010  Vitals shown include unfiled device data.  Last Pain:  Vitals:   04/26/23 0752  TempSrc: Oral  PainSc: 0-No pain         Complications: No notable events documented.

## 2023-04-26 NOTE — Discharge Instructions (Signed)
  Colonoscopy Discharge Instructions  Read the instructions outlined below and refer to this sheet in the next few weeks. These discharge instructions provide you with general information on caring for yourself after you leave the hospital. Your doctor may also give you specific instructions. While your treatment has been planned according to the most current medical practices available, unavoidable complications occasionally occur. If you have any problems or questions after discharge, call Dr. Jena Gauss at [phone]. ACTIVITY You may resume your regular activity, but move at a slower pace for the next 24 hours.  Take frequent rest periods for the next 24 hours.  Walking will help get rid of the air and reduce the bloated feeling in your belly (abdomen).  No driving for 24 hours (because of the medicine (anesthesia) used during the test).   Do not sign any important legal documents or operate any machinery for 24 hours (because of the anesthesia used during the test).  NUTRITION Drink plenty of fluids.  You may resume your normal diet as instructed by your doctor.  Begin with a light meal and progress to your normal diet. Heavy or fried foods are harder to digest and may make you feel sick to your stomach (nauseated).  Avoid alcoholic beverages for 24 hours or as instructed.  MEDICATIONS You may resume your normal medications unless your doctor tells you otherwise.  WHAT YOU CAN EXPECT TODAY Some feelings of bloating in the abdomen.  Passage of more gas than usual.  Spotting of blood in your stool or on the toilet paper.  IF YOU HAD POLYPS REMOVED DURING THE COLONOSCOPY: No aspirin products for 7 days or as instructed.  No alcohol for 7 days or as instructed.  Eat a soft diet for the next 24 hours.  FINDING OUT THE RESULTS OF YOUR TEST Not all test results are available during your visit. If your test results are not back during the visit, make an appointment with your caregiver to find out the  results. Do not assume everything is normal if you have not heard from your caregiver or the medical facility. It is important for you to follow up on all of your test results.  SEEK IMMEDIATE MEDICAL ATTENTION IF: You have more than a spotting of blood in your stool.  Your belly is swollen (abdominal distention).  You are nauseated or vomiting.  You have a temperature over 101.  You have abdominal pain or discomfort that is severe or gets worse throughout the day.     Your colonoscopy was normal today!  A future colonoscopy is not recommended unless new symptoms develop.  At patient request, I called Rex Gerl at [phone] answer.

## 2023-04-26 NOTE — Anesthesia Procedure Notes (Signed)
Date/Time: 04/26/2023 9:51 AM  Performed by: Julian Reil, CRNAPre-anesthesia Checklist: Patient identified, Emergency Drugs available, Suction available and Patient being monitored Patient Re-evaluated:Patient Re-evaluated prior to induction Oxygen Delivery Method: Nasal cannula Induction Type: IV induction Placement Confirmation: positive ETCO2

## 2023-04-26 NOTE — Anesthesia Postprocedure Evaluation (Signed)
Anesthesia Post Note  Patient: [name]  Procedure(s) Performed: COLONOSCOPY WITH PROPOFOL  Patient location during evaluation: Phase II Anesthesia Type: General Level of consciousness: awake Pain management: pain level controlled Vital Signs Assessment: post-procedure vital signs reviewed and stable Respiratory status: spontaneous breathing and respiratory function stable Cardiovascular status: blood pressure returned to baseline and stable Postop Assessment: no headache and no apparent nausea or vomiting Anesthetic complications: no Comments: Late entry   No notable events documented.   Last Vitals:  Vitals:   04/26/23 0752 04/26/23 1009  BP: 128/71 (!) 84/47  Pulse: 90 83  Resp: 17 18  Temp: 36.6 C 36.4 C  SpO2: 98% 100%    Last Pain:  Vitals:   04/26/23 1009  TempSrc:   PainSc: 0-No pain                 Windell Norfolk

## 2023-04-26 NOTE — Anesthesia Preprocedure Evaluation (Signed)
Anesthesia Evaluation  Patient identified by MRN, date of birth, ID band Patient awake    Reviewed: Allergy & Precautions, H&P , NPO status , Patient's Chart, lab work & pertinent test results, reviewed documented beta blocker date and time   History of Anesthesia Complications (+) PONV and history of anesthetic complications  Airway Mallampati: II  TM Distance: >3 FB Neck ROM: full    Dental no notable dental hx.    Pulmonary neg pulmonary ROS, sleep apnea    Pulmonary exam normal breath sounds clear to auscultation       Cardiovascular Exercise Tolerance: Good hypertension, negative cardio ROS  Rhythm:regular Rate:Normal     Neuro/Psych negative neurological ROS  negative psych ROS   GI/Hepatic negative GI ROS, Neg liver ROS,GERD  ,,  Endo/Other  diabetesHypothyroidism  Morbid obesity  Renal/GU negative Renal ROS  negative genitourinary   Musculoskeletal   Abdominal   Peds  Hematology negative hematology ROS (+)   Anesthesia Other Findings   Reproductive/Obstetrics negative OB ROS                             Anesthesia Physical Anesthesia Plan  ASA: 3  Anesthesia Plan: General   Post-op Pain Management:    Induction:   PONV Risk Score and Plan: Propofol infusion  Airway Management Planned:   Additional Equipment:   Intra-op Plan:   Post-operative Plan:   Informed Consent: I have reviewed the patients History and Physical, chart, labs and discussed the procedure including the risks, benefits and alternatives for the proposed anesthesia with the patient or authorized representative who has indicated his/her understanding and acceptance.     Dental Advisory Given  Plan Discussed with: CRNA  Anesthesia Plan Comments:        Anesthesia Quick Evaluation

## 2023-04-26 NOTE — H&P (Signed)
@LOGO @   Primary Care Physician:  Carylon Perches, MD Primary Gastroenterologist:  Dr. Jena Gauss  Pre-Procedure History & Physical: HPI:  Stephanie [name] is a 72 y.o. female is here for a screening colonoscopy.  Negative colonoscopy 10 years ago.  No bowel symptoms.  Past Medical History:  Diagnosis Date   Diabetes mellitus without complication (HCC)    GERD (gastroesophageal reflux disease)    Hypercholesteremia    Hypertension    Hypothyroidism    PONV (postoperative nausea and vomiting)    Sleep apnea     Past Surgical History:  Procedure Laterality Date   ABDOMINAL HYSTERECTOMY     BREAST BIOPSY Left    neg   COLONOSCOPY N/A 12/26/2012   Procedure: COLONOSCOPY;  Surgeon: Corbin Ade, MD;  Location: [facility];  Service: Endoscopy;  Laterality: N/A;  9:15 AM   TOTAL KNEE ARTHROPLASTY Left 03/04/2016   Procedure: LEFT TOTAL KNEE ARTHROPLASTY;  Surgeon: Vickki Hearing, MD;  Location: [facility];  Service: Orthopedics;  Laterality: Left;   TUBAL LIGATION      Prior to Admission medications   Medication Sig Start Date End Date Taking? Authorizing Provider  hydrochlorothiazide (HYDRODIURIL) 25 MG tablet Take 25 mg by mouth daily.   Yes [provider]  levothyroxine (SYNTHROID, LEVOTHROID) 75 MCG tablet Take 75 mcg by mouth daily before breakfast.   Yes [provider]  losartan (COZAAR) 100 MG tablet Take 100 mg by mouth daily. 03/28/21  Yes [provider]  metFORMIN (GLUCOPHAGE) 1000 MG tablet Take 1,000 mg by mouth 2 (two) times daily with a meal.   Yes [provider]  pioglitazone (ACTOS) 15 MG tablet Take 15 mg by mouth daily. 12/11/18  Yes [provider]  pravastatin (PRAVACHOL) 40 MG tablet Take 40 mg by mouth daily.   Yes [provider]  ranitidine (ZANTAC) 150 MG tablet Take 150 mg by mouth daily as needed for heartburn. Only occasionally   Yes [provider]  aspirin EC 81 MG tablet Take 81 mg by mouth  daily. Swallow whole.    [provider]  Cholecalciferol (VITAMIN D3) 50 MCG (2000 UT) capsule Take 2,000 Units by mouth daily.    [provider]  cyanocobalamin (VITAMIN B12) 500 MCG tablet Take 500 mcg by mouth daily.    [provider]  latanoprost (XALATAN) 0.005 % ophthalmic solution INSTILL 1 DROP INTO EACH EYE EVERY DAY AT BEDTIME 01/28/19   [provider]    Allergies as of 03/26/2023   (No Known Allergies)    Family History  Problem Relation Age of Onset   Colon cancer Neg Hx     Social History   Socioeconomic History   Marital status: Married    Spouse name: Not on file   Number of children: Not on file   Years of education: Not on file   Highest education level: Not on file  Occupational History   Not on file  Tobacco Use   Smoking status: Never   Smokeless tobacco: Never  Substance and Sexual Activity   Alcohol use: No   Drug use: No   Sexual activity: Not on file  Other Topics Concern   Not on file  Social History Narrative   Not on file   Social Determinants of Health   Financial Resource Strain: Not on file  Food Insecurity: Not on file  Transportation Needs: Not on file  Physical Activity: Not on file  Stress: Not on file  Social Connections: Not on file  Intimate Partner Violence: Not on file    Review of Systems: See HPI, otherwise negative ROS  Physical Exam: BP 128/71   Pulse 90   Temp 97.9 F (36.6 C) (Oral)   Resp 17   Ht 5\' 2"  (1.575 m)   Wt 95.8 kg   SpO2 98%   BMI 38.63 kg/m  General:   Alert,  Well-developed, well-nourished, pleasant and cooperative in NAD Lungs:  Clear throughout to auscultation.   No wheezes, crackles, or rhonchi. No acute distress. Heart:  Regular rate and rhythm; no murmurs, clicks, rubs,  or gallops. Abdomen:  Soft, nontender and nondistended. No masses, hepatosplenomegaly or hernias noted. Normal bowel sounds, without guarding, and without rebound.     Impression/Plan: [name] is now here to undergo a screening colonoscopy.  Risks, benefits, limitations, imponderables and alternatives regarding colonoscopy have been reviewed with the patient. Questions have been answered. All parties agreeable.     Notice:  This dictation was prepared with Dragon dictation along with smaller phrase technology. Any transcriptional errors that result from this process are unintentional and may not be corrected upon review.

## 2023-04-26 NOTE — Op Note (Signed)
[facility] Patient Name: Stephanie [name] Procedure Date: 04/26/2023 9:35 AM MRN: [id number] Date of Birth: Jun 25, 1951 Attending MD: Gennette Pac , MD, [id number] CSN: [id number] Age: 72 Admit Type: Outpatient Procedure:                Colonoscopy Indications:              Screening for colorectal malignant neoplasm Providers:                Gennette Pac, MD, Nena Polio, RN, Pandora Leiter, Technician, Elinor Parkinson Referring MD:              Medicines:                Propofol per Anesthesia Complications:            No immediate complications. Estimated Blood Loss:     Estimated blood loss: none. Procedure:                Pre-Anesthesia Assessment:                           - Prior to the procedure, a History and Physical                            was performed, and patient medications and                            allergies were reviewed. The patient's tolerance of                            previous anesthesia was also reviewed. The risks                            and benefits of the procedure and the sedation                            options and risks were discussed with the patient.                            All questions were answered, and informed consent                            was obtained. Prior Anticoagulants: The patient has                            taken no anticoagulant or antiplatelet agents. ASA                            Grade Assessment: III - A patient with severe                            systemic disease. After reviewing the risks and  benefits, the patient was deemed in satisfactory                            condition to undergo the procedure.                           After obtaining informed consent, the colonoscope                            was passed under direct vision. Throughout the                            procedure, the patient's blood pressure, pulse, and                             oxygen saturations were monitored continuously. The                            [id number]) scope was introduced through the                            anus and advanced to the the cecum, identified by                            appendiceal orifice and ileocecal valve. The                            colonoscopy was performed without difficulty. The                            patient tolerated the procedure well. The quality                            of the bowel preparation was adequate. The                            ileocecal valve, appendiceal orifice, and rectum                            were photographed. The colonoscopy was performed                            without difficulty. The patient tolerated the                            procedure well. The quality of the bowel                            preparation was adequate. The quality of the bowel                            preparation was adequate. Scope In: 9:49:50 AM Scope Out: 10:05:18 AM Scope Withdrawal Time: 0 hours 9 minutes 11 seconds  Total Procedure Duration: 0 hours 15 minutes 28  seconds  Findings:      The perianal and digital rectal examinations were normal.      The colon (entire examined portion) appeared normal.      The retroflexed view of the distal rectum and anal verge was normal and       showed no anal or rectal abnormalities. Impression:               - The entire examined colon is normal.                           - The distal rectum and anal verge are normal on                            retroflexion view.                           - No specimens collected. Moderate Sedation:      Moderate (conscious) sedation was personally administered by an       anesthesia professional. The following parameters were monitored: oxygen       saturation, heart rate, blood pressure, respiratory rate, EKG, adequacy       of pulmonary ventilation, and response to care. Recommendation:            - Patient has a contact number available for                            emergencies. The signs and symptoms of potential                            delayed complications were discussed with the                            patient. Return to normal activities tomorrow.                            Written discharge instructions were provided to the                            patient.                           - Advance diet as tolerated.                           - Continue present medications.                           - No repeat colonoscopy due to age.                           - Return to GI clinic PRN. Procedure Code(s):        --- Professional ---                           [phone], Colonoscopy, flexible; diagnostic, including  collection of specimen(s) by brushing or washing,                            when performed (separate procedure) Diagnosis Code(s):        --- Professional ---                           Z12.11, Encounter for screening for malignant                            neoplasm of colon CPT copyright 2022 [employer]. All rights reserved. The codes documented in this report are preliminary and upon coder review may  be revised to meet current compliance requirements. Gerrit Friends. Tashaya Ancrum, MD Gennette Pac, MD 04/26/2023 10:16:02 AM This report has been signed electronically. Number of Addenda: 0

## 2023-04-27 DIAGNOSIS — G4733 Obstructive sleep apnea (adult) (pediatric): Secondary | ICD-10-CM | POA: Diagnosis not present

## 2023-04-28 DIAGNOSIS — E1122 Type 2 diabetes mellitus with diabetic chronic kidney disease: Secondary | ICD-10-CM | POA: Diagnosis not present

## 2023-04-28 DIAGNOSIS — I10 Essential (primary) hypertension: Secondary | ICD-10-CM | POA: Diagnosis not present

## 2023-04-28 DIAGNOSIS — N1831 Chronic kidney disease, stage 3a: Secondary | ICD-10-CM | POA: Diagnosis not present

## 2023-05-04 ENCOUNTER — Encounter (HOSPITAL_COMMUNITY): Payer: Self-pay | Admitting: Internal Medicine

## 2023-05-27 DIAGNOSIS — G4733 Obstructive sleep apnea (adult) (pediatric): Secondary | ICD-10-CM | POA: Diagnosis not present

## 2023-06-27 DIAGNOSIS — G4733 Obstructive sleep apnea (adult) (pediatric): Secondary | ICD-10-CM | POA: Diagnosis not present

## 2023-09-01 DIAGNOSIS — H40053 Ocular hypertension, bilateral: Secondary | ICD-10-CM | POA: Diagnosis not present

## 2023-09-01 DIAGNOSIS — H25813 Combined forms of age-related cataract, bilateral: Secondary | ICD-10-CM | POA: Diagnosis not present

## 2023-09-01 DIAGNOSIS — H43813 Vitreous degeneration, bilateral: Secondary | ICD-10-CM | POA: Diagnosis not present

## 2023-09-01 DIAGNOSIS — H31092 Other chorioretinal scars, left eye: Secondary | ICD-10-CM | POA: Diagnosis not present

## 2023-10-19 DIAGNOSIS — Z79899 Other long term (current) drug therapy: Secondary | ICD-10-CM | POA: Diagnosis not present

## 2023-10-19 DIAGNOSIS — E785 Hyperlipidemia, unspecified: Secondary | ICD-10-CM | POA: Diagnosis not present

## 2023-10-19 DIAGNOSIS — N1832 Chronic kidney disease, stage 3b: Secondary | ICD-10-CM | POA: Diagnosis not present

## 2023-10-19 DIAGNOSIS — E039 Hypothyroidism, unspecified: Secondary | ICD-10-CM | POA: Diagnosis not present

## 2023-10-19 DIAGNOSIS — I10 Essential (primary) hypertension: Secondary | ICD-10-CM | POA: Diagnosis not present

## 2023-10-19 DIAGNOSIS — K760 Fatty (change of) liver, not elsewhere classified: Secondary | ICD-10-CM | POA: Diagnosis not present

## 2023-10-19 DIAGNOSIS — M1990 Unspecified osteoarthritis, unspecified site: Secondary | ICD-10-CM | POA: Diagnosis not present

## 2023-10-19 DIAGNOSIS — K219 Gastro-esophageal reflux disease without esophagitis: Secondary | ICD-10-CM | POA: Diagnosis not present

## 2023-10-19 DIAGNOSIS — E1129 Type 2 diabetes mellitus with other diabetic kidney complication: Secondary | ICD-10-CM | POA: Diagnosis not present

## 2023-11-08 ENCOUNTER — Other Ambulatory Visit (INDEPENDENT_AMBULATORY_CARE_PROVIDER_SITE_OTHER): Payer: Self-pay

## 2023-11-08 ENCOUNTER — Ambulatory Visit: Admitting: Orthopedic Surgery

## 2023-11-08 DIAGNOSIS — M1711 Unilateral primary osteoarthritis, right knee: Secondary | ICD-10-CM

## 2023-11-08 NOTE — Progress Notes (Signed)
 Patient ID: Stephanie [name], female   DOB: 25-Jun-1951, 73 y.o.   MRN: [id number]  Chief Complaint  Patient presents with   Follow-up    Recheck on right knee   Encounter Diagnosis  Name Primary?   Primary osteoarthritis of right knee Yes    73 year old female with osteoarthritis of the right knee status post left total knee arthroplasty in 2017 did well  Comes in with acute onset of increased pain on top of chronic pain about 2 weeks ago which has subsequently resolved and patient is at her baseline.  She does report occasional use of a cane  Weightbearing pain Stiffness  Physical Exam Vitals and nursing note reviewed.  Constitutional:      Appearance: Normal appearance.  HENT:     Head: Normocephalic and atraumatic.  Eyes:     General: No scleral icterus.       Right eye: No discharge.        Left eye: No discharge.     Extraocular Movements: Extraocular movements intact.     Conjunctiva/sclera: Conjunctivae normal.     Pupils: Pupils are equal, round, and reactive to light.  Cardiovascular:     Rate and Rhythm: Normal rate.     Pulses: Normal pulses.  Musculoskeletal:     Comments: Right knee  Skin warm dry intact no erythema rash or ecchymosis  Numbness medial joint line  Range of motion 0 to 90 degrees  Stability test normal  Skin:    General: Skin is warm and dry.     Capillary Refill: Capillary refill takes less than 2 seconds.  Neurological:     General: No focal deficit present.     Mental Status: She is alert and oriented to person, place, and time.     Gait: Gait abnormal.  Psychiatric:        Mood and Affect: Mood normal.        Behavior: Behavior normal.        Thought Content: Thought content normal.        Judgment: Judgment normal.    DG Knee AP/LAT W/Sunrise Right Result Date: 11/08/2023 Image report Right knee Severe arthritis is noted in the right knee.  We see joint space narrowing.  For narrowing mild grade 2-3 in the lateral  compartment and then patellofemoral disease on the lateral view is severe as well there are some posterior osteophytes as well Impression severe arthritis right knee grade 4 with mild varus deformity as well   Assessment and plan  Progressively worsening arthritis of the right knee with stiffness of the joint  We discussed possible surgical intervention at the patient's convenience as her symptoms baseline with occasional intermittent increases in pain  She opted to come back in the summer to planned surgery for the end of the summer

## 2023-12-01 DIAGNOSIS — G4733 Obstructive sleep apnea (adult) (pediatric): Secondary | ICD-10-CM | POA: Diagnosis not present

## 2023-12-30 ENCOUNTER — Encounter (HOSPITAL_BASED_OUTPATIENT_CLINIC_OR_DEPARTMENT_OTHER): Payer: Self-pay | Admitting: Pulmonary Disease

## 2023-12-30 ENCOUNTER — Ambulatory Visit (HOSPITAL_BASED_OUTPATIENT_CLINIC_OR_DEPARTMENT_OTHER): Payer: Medicare HMO | Admitting: Pulmonary Disease

## 2023-12-30 VITALS — BP 129/75 | HR 88 | Ht 62.0 in | Wt 212.1 lb

## 2023-12-30 DIAGNOSIS — I10 Essential (primary) hypertension: Secondary | ICD-10-CM

## 2023-12-30 DIAGNOSIS — G4733 Obstructive sleep apnea (adult) (pediatric): Secondary | ICD-10-CM

## 2023-12-30 DIAGNOSIS — Z9981 Dependence on supplemental oxygen: Secondary | ICD-10-CM

## 2023-12-30 NOTE — Patient Instructions (Signed)
CPAP supplies will be renewed x 1 year 

## 2023-12-30 NOTE — Addendum Note (Signed)
 Addended by: Kary Pages on: 12/30/2023 09:06 AM   Modules accepted: Orders

## 2023-12-30 NOTE — Progress Notes (Signed)
   Subjective:    Patient ID: Stephanie [name], female    DOB: 06/14/1951, 73 y.o.   MRN: [id number]  HPI  72 for follow-up of severe OSA   PMH -diabetes type 2 Hypertension Hyperlipidemia   Annual FU  She is adjusting well to her CPAP machine, using it nightly with a nasal mask. She experiences improved sleep quality, feeling more rested and energetic in the mornings, with a reduced need for daytime naps.  She occasionally wakes with a headache that resolves after 10 to 15 minutes of activity. She sometimes experiences a dry mouth upon waking. No runny nose or itchy eyes.  CPAP download: 0.2 events per hour, excellent compliance, minimal leak   Significant tests/ events reviewed   HST 12/2021  (SNAP ) >> 203 lbs , AH I 39/h, lowest desat 66%  Review of Systems neg for any significant sore throat, dysphagia, itching, sneezing, nasal congestion or excess/ purulent secretions, fever, chills, sweats, unintended wt loss, pleuritic or exertional cp, hempoptysis, orthopnea pnd or change in chronic leg swelling. Also denies presyncope, palpitations, heartburn, abdominal pain, nausea, vomiting, diarrhea or change in bowel or urinary habits, dysuria,hematuria, rash, arthralgias, visual complaints, headache, numbness weakness or ataxia.     Objective:   Physical Exam  Gen. Pleasant, obese, in no distress ENT - no lesions, no post nasal drip Neck: No JVD, no thyromegaly, no carotid bruits Lungs: no use of accessory muscles, no dullness to percussion, decreased without rales or rhonchi  Cardiovascular: Rhythm regular, heart sounds  normal, no murmurs or gallops, no peripheral edema Musculoskeletal: No deformities, no cyanosis or clubbing , no tremors       Assessment & Plan:   Obstructive Sleep Apnea Obstructive sleep apnea is well-controlled with CPAP therapy. She reports improved sleep quality and reduced daytime napping. CPAP download shows good control of events with auto  settings and excellent compliance, approximately eight hours per night. Minimal leak is noted. She uses a nasal mask, which is effective. The machine has reduced apnea events from twenty per hour to 0.2 per hour, demonstrating significant improvement. - Continue current CPAP therapy with auto settings. - Renew CPAP supplies for one year. - Advise to maintain weight to avoid adjustments in CPAP pressure. - Instruct to use CPAP machine consistently, especially post-knee replacement surgery, to prevent exacerbation of sleep apnea due to pain medications.  Planned Knee Replacement She is planning a knee replacement surgery. A follow-up with Dr. Elsa Halls is scheduled next month to set the surgery date. She is advised to use the CPAP machine post-surgery to manage sleep apnea, especially if pain medications are prescribed. - Coordinate with Dr. Elsa Halls for surgical clearance if required. - Advise to bring CPAP machine to the hospital for use post-surgery.   Hypertension Hypertension is well-controlled with current medications. Blood pressure readings are satisfactory.

## 2024-01-20 ENCOUNTER — Ambulatory Visit: Admitting: Orthopedic Surgery

## 2024-01-20 DIAGNOSIS — E1129 Type 2 diabetes mellitus with other diabetic kidney complication: Secondary | ICD-10-CM | POA: Diagnosis not present

## 2024-01-20 DIAGNOSIS — N1832 Chronic kidney disease, stage 3b: Secondary | ICD-10-CM | POA: Diagnosis not present

## 2024-01-20 DIAGNOSIS — E785 Hyperlipidemia, unspecified: Secondary | ICD-10-CM | POA: Diagnosis not present

## 2024-01-20 DIAGNOSIS — Z79899 Other long term (current) drug therapy: Secondary | ICD-10-CM | POA: Diagnosis not present

## 2024-01-20 DIAGNOSIS — I10 Essential (primary) hypertension: Secondary | ICD-10-CM | POA: Diagnosis not present

## 2024-01-20 DIAGNOSIS — K760 Fatty (change of) liver, not elsewhere classified: Secondary | ICD-10-CM | POA: Diagnosis not present

## 2024-01-20 DIAGNOSIS — E039 Hypothyroidism, unspecified: Secondary | ICD-10-CM | POA: Diagnosis not present

## 2024-01-27 ENCOUNTER — Encounter: Payer: Self-pay | Admitting: Orthopedic Surgery

## 2024-01-27 ENCOUNTER — Ambulatory Visit: Admitting: Orthopedic Surgery

## 2024-01-27 VITALS — BP 132/81 | HR 101 | Ht 62.0 in | Wt 209.0 lb

## 2024-01-27 DIAGNOSIS — E1129 Type 2 diabetes mellitus with other diabetic kidney complication: Secondary | ICD-10-CM | POA: Diagnosis not present

## 2024-01-27 DIAGNOSIS — M1711 Unilateral primary osteoarthritis, right knee: Secondary | ICD-10-CM | POA: Diagnosis not present

## 2024-01-27 DIAGNOSIS — L03032 Cellulitis of left toe: Secondary | ICD-10-CM | POA: Diagnosis not present

## 2024-01-27 DIAGNOSIS — Z01818 Encounter for other preprocedural examination: Secondary | ICD-10-CM | POA: Diagnosis not present

## 2024-01-27 DIAGNOSIS — Z0183 Encounter for blood typing: Secondary | ICD-10-CM | POA: Diagnosis not present

## 2024-01-27 MED ORDER — BUPIVACAINE-MELOXICAM ER 400-12 MG/14ML IJ SOLN: 400.0000 mg | Freq: Once | INTRAMUSCULAR | Status: AC

## 2024-01-27 NOTE — Progress Notes (Signed)
 Preop for right total knee arthroplasty  BP 132/81   Pulse (!) 101   Ht 5' 2 (1.575 m)   Wt 209 lb (94.8 kg)   BMI 38.23 kg/m   Body mass index is 38.23 kg/m.  Chief Complaint  Patient presents with   Knee Pain    R feeling better since last visit.     Encounter Diagnosis  Name Primary?   Primary osteoarthritis of right knee Yes    DOI/DOS/ Date: Pain for years  Stephanie [name] would still like to proceed with her right total knee arthroplasty in September  She has completed a good course of nonoperative treatment  Her hemoglobin A1c has been 6.7 she sees her primary care doctor today  Her bathroom and bedroom on 1 floor Minimal steps to get into the house  As noted from her prior arthroplasty on the left she understands the risk and benefits of surgery which were reviewed  Range of motion was 5-90 degrees Quadricep strength normal Skin envelope intact Knee stable Tenderness primarily medially but also in the patellofemoral area no effusion  Prior imaging studies show varus grade 4 arthritis medial compartment multiple osteophytes patellofemoral disease as well  Right total knee arthroplasty at the patient's convenience  Co. finding variables include  Past Medical History:  Diagnosis Date   Diabetes mellitus without complication (HCC)    GERD (gastroesophageal reflux disease)    Hypercholesteremia    Hypertension    Hypothyroidism    PONV (postoperative nausea and vomiting)    Sleep apnea

## 2024-01-27 NOTE — Patient Instructions (Signed)
 Your surgery will be at [facility] by Dr Phyllis Breeze  plan to be in hospital overnight. The hospital will contact you with a preoperative appointment to discuss Anesthesia.  Please arrive on time or 15 minutes early for the preoperative appointment, they have a very tight schedule if you are late or do not come in your surgery will be cancelled.  The phone number for the preop area is [phone]. Please bring your medications with you for the appointment. They will tell you the arrival time for surgery and medication instructions when you have your preoperative evaluation. Do not wear nail polish the day of your surgery and if you take Phentermine you need to stop this medication ONE WEEK prior to your surgery. f you take Invokana , Farxiga, Jardiance, or Steglatro) - Hold 72 hours before the procedure.  If you take Ozempic,  Bydureo, Mounjaro, [place] or Trulicity do not take for 8 days before your surgery. If you take Victoza, Rybelsis, Saxenda or Adlyxi stop 24 hours before the procedure. Please arrive at the hospital 2 hours before procedure if scheduled at 9:30 or later in the day or at the time the nurse tells you at your preoperative visit.   If you have my chart do not use the time given in my chart use the time given to you by the nurse during your preoperative visit.   Your surgery  time may change. Please be available for phone calls the day of your surgery and the day before. The Short Stay department may need to discuss changes about your surgery time. Not reaching the you could lead to procedure delays and possible cancellation.  You must have a ride home and someone to stay with you for 24 to 48 hours. The person taking you home will receive and sign for the your discharge instructions.  Please be prepared to give your support person's name and telephone number to [employer]. Dr Phyllis Breeze will need that name and phone number post procedure.   You will also get a call from a  representative of [employer], they have a machine that you will use in the first few weeks after surgery. It is called a CPM.    You will get a call also from [facility] outpatient therapy for therapy starting the week of surgery.  If you have questions or need to Reschedule the surgery, call the office ask for Deval Mroczka.

## 2024-01-27 NOTE — Progress Notes (Signed)
   BP 132/81   Pulse (!) 101   Ht 5' 2 (1.575 m)   Wt 209 lb (94.8 kg)   BMI 38.23 kg/m   Body mass index is 38.23 kg/m.  Chief Complaint  Patient presents with   Knee Pain    R feeling better since last visit.     No diagnosis found.  DOI/DOS/ Date: Pain for years  Improved

## 2024-01-31 ENCOUNTER — Other Ambulatory Visit (HOSPITAL_COMMUNITY): Payer: Self-pay | Admitting: Internal Medicine

## 2024-01-31 DIAGNOSIS — Z1231 Encounter for screening mammogram for malignant neoplasm of breast: Secondary | ICD-10-CM

## 2024-02-21 ENCOUNTER — Ambulatory Visit (HOSPITAL_COMMUNITY)
Admission: RE | Admit: 2024-02-21 | Discharge: 2024-02-21 | Disposition: A | Discharge status: Home / Self Care | Source: Ambulatory Visit | Attending: Internal Medicine | Admitting: Internal Medicine

## 2024-02-21 DIAGNOSIS — Z1231 Encounter for screening mammogram for malignant neoplasm of breast: Secondary | ICD-10-CM | POA: Insufficient documentation

## 2024-02-24 ENCOUNTER — Other Ambulatory Visit (HOSPITAL_COMMUNITY): Payer: Self-pay | Admitting: Internal Medicine

## 2024-02-24 DIAGNOSIS — R928 Other abnormal and inconclusive findings on diagnostic imaging of breast: Secondary | ICD-10-CM

## 2024-02-29 DIAGNOSIS — H40053 Ocular hypertension, bilateral: Secondary | ICD-10-CM | POA: Diagnosis not present

## 2024-02-29 DIAGNOSIS — H25813 Combined forms of age-related cataract, bilateral: Secondary | ICD-10-CM | POA: Diagnosis not present

## 2024-03-07 ENCOUNTER — Ambulatory Visit (HOSPITAL_COMMUNITY)
Admission: RE | Admit: 2024-03-07 | Discharge: 2024-03-07 | Disposition: A | Discharge status: Home / Self Care | Source: Ambulatory Visit | Attending: Internal Medicine | Admitting: Internal Medicine

## 2024-03-07 ENCOUNTER — Ambulatory Visit (HOSPITAL_COMMUNITY)
Admission: RE | Admit: 2024-03-07 | Discharge: 2024-03-07 | Discharge status: Home / Self Care | Source: Ambulatory Visit | Attending: Internal Medicine | Admitting: Internal Medicine

## 2024-03-07 ENCOUNTER — Encounter (HOSPITAL_COMMUNITY): Payer: Self-pay

## 2024-03-07 DIAGNOSIS — R928 Other abnormal and inconclusive findings on diagnostic imaging of breast: Secondary | ICD-10-CM

## 2024-03-07 DIAGNOSIS — N6325 Unspecified lump in the left breast, overlapping quadrants: Secondary | ICD-10-CM | POA: Diagnosis not present

## 2024-03-07 DIAGNOSIS — R92322 Mammographic fibroglandular density, left breast: Secondary | ICD-10-CM | POA: Diagnosis not present

## 2024-03-07 DIAGNOSIS — N6002 Solitary cyst of left breast: Secondary | ICD-10-CM | POA: Diagnosis not present

## 2024-03-10 DIAGNOSIS — Z809 Family history of malignant neoplasm, unspecified: Secondary | ICD-10-CM | POA: Diagnosis not present

## 2024-03-10 DIAGNOSIS — E1122 Type 2 diabetes mellitus with diabetic chronic kidney disease: Secondary | ICD-10-CM | POA: Diagnosis not present

## 2024-03-10 DIAGNOSIS — Z833 Family history of diabetes mellitus: Secondary | ICD-10-CM | POA: Diagnosis not present

## 2024-03-10 DIAGNOSIS — E1136 Type 2 diabetes mellitus with diabetic cataract: Secondary | ICD-10-CM | POA: Diagnosis not present

## 2024-03-10 DIAGNOSIS — E039 Hypothyroidism, unspecified: Secondary | ICD-10-CM | POA: Diagnosis not present

## 2024-03-10 DIAGNOSIS — Z8249 Family history of ischemic heart disease and other diseases of the circulatory system: Secondary | ICD-10-CM | POA: Diagnosis not present

## 2024-03-10 DIAGNOSIS — Z7982 Long term (current) use of aspirin: Secondary | ICD-10-CM | POA: Diagnosis not present

## 2024-03-10 DIAGNOSIS — Z008 Encounter for other general examination: Secondary | ICD-10-CM | POA: Diagnosis not present

## 2024-03-10 DIAGNOSIS — Z7984 Long term (current) use of oral hypoglycemic drugs: Secondary | ICD-10-CM | POA: Diagnosis not present

## 2024-03-10 DIAGNOSIS — N1832 Chronic kidney disease, stage 3b: Secondary | ICD-10-CM | POA: Diagnosis not present

## 2024-03-10 DIAGNOSIS — M1990 Unspecified osteoarthritis, unspecified site: Secondary | ICD-10-CM | POA: Diagnosis not present

## 2024-03-10 DIAGNOSIS — E785 Hyperlipidemia, unspecified: Secondary | ICD-10-CM | POA: Diagnosis not present

## 2024-03-14 ENCOUNTER — Ambulatory Visit (HOSPITAL_COMMUNITY)

## 2024-03-14 ENCOUNTER — Encounter (HOSPITAL_COMMUNITY)

## 2024-03-26 DIAGNOSIS — G4733 Obstructive sleep apnea (adult) (pediatric): Secondary | ICD-10-CM | POA: Diagnosis not present

## 2024-04-14 NOTE — Patient Instructions (Signed)
 Stephanie [name]  04/14/2024     @[email or web address] @   Your procedure is scheduled on  04/25/2024.   Report to [facility] at  0600  A.M.   Call this number if you have problems the morning of surgery:  [phone]  If you experience any cold or flu symptoms such as cough, fever, chills, shortness of breath, etc. between now and your scheduled surgery, please notify us  at the above number.   Remember:  Do not eat after midnight.   You may drink clear liquids until 0330 am on 04/25/2024.     Clear liquids allowed are:                    Water , Juice (No red color; non-citric and without pulp; diabetics please choose diet or no sugar options), Carbonated beverages (diabetics please choose diet or no sugar options), Clear Tea (No creamer, milk, or cream, including half & half and powdered creamer), Black Coffee Only (No creamer, milk or cream, including half & half and powdered creamer), and Clear Sports drink (No red color; diabetics please choose diet or no sugar options)           DO NOT take any medications for diabetes the morning of your procedure.    Take these medicines the morning of surgery with A SIP OF WATER                               esomeprazole, levothyroxine .    Do not wear jewelry, make-up or nail polish, including gel polish,  artificial nails, or any other type of covering on natural nails (fingers and  toes).  Do not wear lotions, powders, or perfumes, or deodorant.  Do not shave 48 hours prior to surgery.  Men may shave face and neck.  Do not bring valuables to the hospital.  [facility] is not responsible for any belongings or valuables.  Contacts, dentures or bridgework may not be worn into surgery.  Leave your suitcase in the car.  After surgery it may be brought to your room.  For patients admitted to the hospital, discharge time will be determined by your treatment team.  Patients discharged the day of surgery will not be  allowed to drive home.    Special instructions:  DO NOT smoke tobacco or vape for 24 hours before your procedure.  Please read over the following fact sheets that you were given. Pain Booklet, Coughing and Deep Breathing, Blood Transfusion Information, Total Joint Packet, MRSA Information, Surgical Site Infection Prevention, Anesthesia Post-op Instructions, and Care and Recovery After Surgery      Total Knee Replacement Surgery: What to Know After After a total knee replacement, it's common to have: Redness, pain, and swelling at the cut from surgery area. This is also called the incision area. Stiffness. Discomfort. A small amount of blood or clear fluid coming from your cut. Follow these instructions at home: Medicines Take your medicines only as told. You may need to take steps to help treat or prevent trouble pooping (constipation), such as: Taking medicines to help you poop. Eating foods high in fiber, like beans, whole grains, and fresh fruits and vegetables. Drinking more fluids as told. Ask your health care provider if it's safe to drive or use machines while taking your medicine. Bathing Do not take baths, swim, or use a hot tub until you're  told it's OK. Ask if you can shower. If you bandage isn't waterproof: Do not let it get wet. Cover it when you take a bath or shower. Use a cover that doesn't let any water  in. Incision care Take care of your cut from surgery as told. Make sure you: Wash your hands with soap and water  for at least 20 seconds before and after you change your bandage. If you can't use soap and water , use hand sanitizer. Change your bandage. Leave stitches or skin glue alone. Leave tape strips alone unless you're told to take them off. You may trim the edges of the tape strips if they curl up. Check the area around your cut every day for signs of infection. Check for: More redness, swelling, or pain. More fluid or blood. Warmth. Pus or a bad  smell.  Activity Rest as told. Get up to take short walks at least every 2 hours during the day. This helps you breathe better and keeps your blood flowing. Ask for help if you feel weak or unsteady. Follow instructions from your provider about using a walker, crutches, or a cane. You may use your legs to support your body weight as told by your provider. Follow instructions about how much weight you may safely support on your affected leg. You may be shown how to get out of a bed and chair and how to go up and down stairs. You will first do this with a walker, crutches, or a cane. Once you are able to walk without a limp, you may stop using a walker, crutches, or a cane, as told by your provider. Do exercises as told by your provider to help restore your strength and knee movement. Exercises will start as soon as possible after your surgery. You'll work with a physical therapist for up to a few months after your surgery. Avoid high-impact activities. Avoid running, jumping rope, and doing jumping jacks. Do not play contact sports until your provider approves. Ask what things are safe for you to do at home. Ask when you can go back to work or school. Managing pain, stiffness, and swelling  Use ice or an ice pack as told. Put ice in a plastic bag or use the cold flow pad or cold therapy unit that you were given. Place a towel between your skin and the bag or device. Leave the ice on for 20 minutes, 2-3 times a day. If your skin turns red, take off the ice right away to prevent skin damage. The risk of damage is higher if you can't feel pain, heat, or cold. Move your toes often to reduce stiffness and swelling. Raise your leg above the level of your heart while you are sitting or lying down. Use several pillows to keep your leg straight. Do not put a pillow just under the knee. If the knee is bent for a long time, this may lead to stiffness. Wear an elastic knee support as told by your  provider. Safety To help prevent falls, keep floors clear. Put things that you may need within easy reach. Wear an apron or tool belt with pockets for carrying things. This leaves your hands free to help with your balance. Ask your provider when it is safe to drive. General instructions Wear compression stockings to reduce swelling and prevent blood clots in your legs. Keep doing breathing exercises as told. This helps prevent lung infection. Do not smoke, vape, or use nicotine or tobacco. Do not have dental work  or cleanings for at least 3 months. Ask your provider if you need to take antibiotics before you have dental work or have your teeth cleaned. Tell your dentist about your new joint. Keep all follow-up visits. Your provider will need to check how your knee is healing. Contact a health care provider if: You have a fever or chills. You have a cough or feel short of breath. You have very bad pain and medicine is not helping your pain. You have any signs of infection. You fall. You have increased swelling, redness, and calf pain. Your cut from surgery breaks open after your stitches or staples are taken out. Get help right away if: You have trouble breathing. You have a fast and irregular heartbeat. You have chest pain. These symptoms may be an emergency. Call 911 right away. Do not wait to see if the symptoms will go away. Do not drive yourself to the hospital. This information is not intended to replace advice given to you by your health care provider. Make sure you discuss any questions you have with your health care provider. Document Revised: 06/03/2023 Document Reviewed: 01/07/2023 Elsevier Patient Education  2024 [employer].Total Knee Replacement Surgery: What to Expect  Total knee replacement is a surgery to replace a damaged knee joint with a new joint that's made of plastic, metal, or ceramic parts. The new joint replaces parts of the thigh bone, lower leg bone, and  kneecap. The new joint is called a prosthetic joint or prosthesis. The goal of surgery is to reduce knee pain and help your knee move better. Tell a health care provider about: Any allergies you have. All medicines you take. These include vitamins, herbs, eye drops, and creams. Any problems you or family members have had with anesthesia. Any bleeding problems you have. Any surgeries you've had. Any medical problems you have. Whether you're pregnant or may be pregnant. What are the risks? Your health care provider will talk with you about risks. These may include: Infection. Bleeding. Blood clots. Allergies to medicines. Damage to nerves and other parts of the knee. Problems with the knee, such as: Not being able to move your knee. A feeling that the knee is weak or unstable. Loosening of the new joint. Pain that doesn't go away. What happens before? When to stop eating and drinking Eat and drink only as you've been told. You may be told this: 8 hours before your surgery Stop eating most foods. Do not eat meat, fried foods, or fatty foods. Eat only light foods, such as toast or crackers. All liquids are OK except energy drinks and alcohol. 6 hours before your surgery Stop eating. Drink only clear liquids, such as water , clear fruit juice, black coffee, plain tea, and sports drinks. Do not drink energy drinks or alcohol. 2 hours before your procedure Stop drinking all liquids. You may be allowed to take medicines with small sips of water . If you do not eat and drink as told, your surgery may be delayed or canceled. Medicines Ask about changing or stopping: Any medicines you take. Any vitamins, herbs, or supplements you take. Do not take aspirin  or ibuprofen unless you're told to. Tests and exams You may have a physical exam. You may have tests, such as: X-rays. An MRI. A CT scan. A bone scan. A blood or pee test. Lifestyle Keep your body and teeth clean. Germs from  anywhere in your body can infect your new joint. Talk with your provider about: Dental care and cleanings.  Skin care. Exercise as told. If you're overweight, work with your provider to lose weight. Extra weight can affect your knee. Do not smoke, vape, or use nicotine or tobacco. Surgery safety For your safety, you may: Need to wash your skin with a soap that kills germs. Get antibiotics. Have your surgery site marked. Have hair removed at the surgery site. General instructions Do not shave your legs just before surgery. This will be done in the hospital if needed. If you'll be going home right after the surgery, plan to have a responsible adult: Drive you home from the hospital or clinic. You won't be allowed to drive. Stay with you for the time you're told. It's best to have someone care for you for at least 4-6 weeks when you get home. What happens during a total knee replacement surgery? An IV will be put into a vein in your hand or arm. You may be given: A sedative to help you relax. Anesthesia to keep you from feeling pain. A cut will be made in your knee. Damaged parts of your knee will be taken out. Parts of the new joint will be placed over the parts that were taken out. Your cut from surgery will be closed with stitches, staples, skin glue, or tape strips. A bandage will be placed over your cut from surgery. These steps may vary. Ask what you can expect. What happens after? You'll be watched closely until you leave. This includes checking your pain level, blood pressure, heart rate, and breathing rate. You'll be given medicines for pain. You may keep getting fluids through your IV. You'll be told to move around as much as you can. You'll be taught how to use crutches or a walker and how to exercise at home. You may be told to wear socks called compression stockings to reduce swelling and help prevent blood clots in your legs. This information is not intended to replace  advice given to you by your health care provider. Make sure you discuss any questions you have with your health care provider. Document Revised: 05/06/2023 Document Reviewed: 01/07/2023 Elsevier Patient Education  2024 [employer].General Anesthesia, Adult, Care After The following information offers guidance on how to care for yourself after your procedure. Your health care provider may also give you more specific instructions. If you have problems or questions, contact your health care provider. What can I expect after the procedure? After the procedure, it is common for people to: Have pain or discomfort at the IV site. Have nausea or vomiting. Have a sore throat or hoarseness. Have trouble concentrating. Feel cold or chills. Feel weak, sleepy, or tired (fatigue). Have soreness and body aches. These can affect parts of the body that were not involved in surgery. Follow these instructions at home: For the time period you were told by your health care provider:  Rest. Do not participate in activities where you could fall or become injured. Do not drive or use machinery. Do not drink alcohol. Do not take sleeping pills or medicines that cause drowsiness. Do not make important decisions or sign legal documents. Do not take care of children on your own. General instructions Drink enough fluid to keep your urine pale yellow. If you have sleep apnea, surgery and certain medicines can increase your risk for breathing problems. Follow instructions from your health care provider about wearing your sleep device: Anytime you are sleeping, including during daytime naps. While taking prescription pain medicines, sleeping medicines, or medicines that make you  drowsy. Return to your normal activities as told by your health care provider. Ask your health care provider what activities are safe for you. Take over-the-counter and prescription medicines only as told by your health care provider. Do not  use any products that contain nicotine or tobacco. These products include cigarettes, chewing tobacco, and vaping devices, such as e-cigarettes. These can delay incision healing after surgery. If you need help quitting, ask your health care provider. Contact a health care provider if: You have nausea or vomiting that does not get better with medicine. You vomit every time you eat or drink. You have pain that does not get better with medicine. You cannot urinate or have bloody urine. You develop a skin rash. You have a fever. Get help right away if: You have trouble breathing. You have chest pain. You vomit blood. These symptoms may be an emergency. Get help right away. Call 911. Do not wait to see if the symptoms will go away. Do not drive yourself to the hospital. Summary After the procedure, it is common to have a sore throat, hoarseness, nausea, vomiting, or to feel weak, sleepy, or fatigue. For the time period you were told by your health care provider, do not drive or use machinery. Get help right away if you have difficulty breathing, have chest pain, or vomit blood. These symptoms may be an emergency. This information is not intended to replace advice given to you by your health care provider. Make sure you discuss any questions you have with your health care provider. Document Revised: 10/31/2021 Document Reviewed: 10/31/2021 Elsevier Patient Education  2024 [employer].How to Use an Incentive Spirometer An incentive spirometer is a tool that measures how well you are filling your lungs with each breath. Learning to take long, deep breaths using this tool can help you keep your lungs clear and active. This may help to reverse or lessen your chance of developing breathing (pulmonary) problems, especially infection. You may be asked to use a spirometer: After a surgery. If you have a lung problem or a history of smoking. After a long period of time when you have been unable to move  or be active. If the spirometer includes an indicator to show the highest number that you have reached, your health care provider or respiratory therapist will help you set a goal. Keep a log of your progress as told by your health care provider. What are the risks? Breathing too quickly may cause dizziness or cause you to pass out. Take your time so you do not get dizzy or light-headed. If you are in pain, you may need to take pain medicine before doing incentive spirometry. It is harder to take a deep breath if you are having pain. How to use your incentive spirometer  Sit up on the edge of your bed or on a chair. Hold the incentive spirometer so that it is in an upright position. Before you use the spirometer, breathe out normally. Place the mouthpiece in your mouth. Make sure your lips are closed tightly around it. Breathe in slowly and as deeply as you can through your mouth, causing the piston or the ball to rise toward the top of the chamber. Hold your breath for 3-5 seconds, or for as long as possible. If the spirometer includes a coach indicator, use this to guide you in breathing. Slow down your breathing if the indicator goes above the marked areas. Remove the mouthpiece from your mouth and breathe out normally. The  piston or ball will return to the bottom of the chamber. Rest for a few seconds, then repeat the steps 10 or more times. Take your time and take a few normal breaths between deep breaths so that you do not get dizzy or light-headed. Do this every 1-2 hours when you are awake. If the spirometer includes a goal marker to show the highest number you have reached (best effort), use this as a goal to work toward during each repetition. After each set of 10 deep breaths, cough a few times. This will help to make sure that your lungs are clear. If you have an incision on your chest or abdomen from surgery, place a pillow or a rolled-up towel firmly against the incision when you  cough. This can help to reduce pain while taking deep breaths and coughing. General tips When you are able to get out of bed: Walk around often. Continue to take deep breaths and cough in order to clear your lungs. Keep using the incentive spirometer until your health care provider says it is okay to stop using it. If you have been in the hospital, you may be told to keep using the spirometer at home. Contact a health care provider if: You are having difficulty using the spirometer. You have trouble using the spirometer as often as instructed. Your pain medicine is not giving enough relief for you to use the spirometer as told. You have a fever. Get help right away if: You develop shortness of breath. You develop a cough with bloody mucus from the lungs. You have fluid or blood coming from an incision site after you cough. Summary An incentive spirometer is a tool that can help you learn to take long, deep breaths to keep your lungs clear and active. You may be asked to use a spirometer after a surgery, if you have a lung problem or a history of smoking, or if you have been inactive for a long period of time. Use your incentive spirometer as instructed every 1-2 hours while you are awake. If you have an incision on your chest or abdomen, place a pillow or a rolled-up towel firmly against your incision when you cough. This will help to reduce pain. Get help right away if you have shortness of breath, you cough up bloody mucus, or blood comes from your incision when you cough. This information is not intended to replace advice given to you by your health care provider. Make sure you discuss any questions you have with your health care provider. Document Revised: 06/11/2023 Document Reviewed: 06/11/2023 Elsevier Patient Education  2024 [employer].

## 2024-04-18 ENCOUNTER — Encounter (HOSPITAL_COMMUNITY)
Admission: RE | Admit: 2024-04-18 | Discharge: 2024-04-18 | Disposition: A | Discharge status: Home / Self Care | Source: Ambulatory Visit | Attending: Orthopedic Surgery | Admitting: Orthopedic Surgery

## 2024-04-18 ENCOUNTER — Encounter (HOSPITAL_COMMUNITY): Payer: Self-pay

## 2024-04-18 VITALS — BP 122/69 | HR 94 | Resp 18 | Ht 62.0 in | Wt 209.0 lb

## 2024-04-18 DIAGNOSIS — E119 Type 2 diabetes mellitus without complications: Secondary | ICD-10-CM | POA: Diagnosis not present

## 2024-04-18 DIAGNOSIS — Z01818 Encounter for other preprocedural examination: Secondary | ICD-10-CM | POA: Insufficient documentation

## 2024-04-18 DIAGNOSIS — I10 Essential (primary) hypertension: Secondary | ICD-10-CM | POA: Diagnosis not present

## 2024-04-18 DIAGNOSIS — M1711 Unilateral primary osteoarthritis, right knee: Secondary | ICD-10-CM | POA: Diagnosis not present

## 2024-04-18 LAB — BASIC METABOLIC PANEL WITH GFR
Anion gap: 10 (ref 5–15)
BUN: 21 mg/dL (ref 8–23)
CO2: 23 mmol/L (ref 22–32)
Calcium: 9.6 mg/dL (ref 8.9–10.3)
Chloride: 105 mmol/L (ref 98–111)
Creatinine, Ser: 1.39 mg/dL — ABNORMAL HIGH (ref 0.44–1.00)
GFR, Estimated: 40 mL/min — ABNORMAL LOW (ref >60)
Glucose, Bld: 152 mg/dL — ABNORMAL HIGH (ref 70–99)
Potassium: 4 mmol/L (ref 3.5–5.1)
Sodium: 138 mmol/L (ref 135–145)

## 2024-04-18 LAB — CBC WITH DIFFERENTIAL/PLATELET
Abs Immature Granulocytes: 0.02 K/uL (ref 0.00–0.07)
Basophils Absolute: 0 K/uL (ref 0.0–0.1)
Basophils Relative: 0 %
Eosinophils Absolute: 0.2 K/uL (ref 0.0–0.5)
Eosinophils Relative: 4 %
HCT: 34.3 % — ABNORMAL LOW (ref 36.0–46.0)
Hemoglobin: 11 g/dL — ABNORMAL LOW (ref 12.0–15.0)
Immature Granulocytes: 0 %
Lymphocytes Relative: 37 %
Lymphs Abs: 1.9 K/uL (ref 0.7–4.0)
MCH: 28.6 pg (ref 26.0–34.0)
MCHC: 32.1 g/dL (ref 30.0–36.0)
MCV: 89.1 fL (ref 80.0–100.0)
Monocytes Absolute: 0.4 K/uL (ref 0.1–1.0)
Monocytes Relative: 7 %
Neutro Abs: 2.7 K/uL (ref 1.7–7.7)
Neutrophils Relative %: 52 %
Platelets: 280 K/uL (ref 150–400)
RBC: 3.85 MIL/uL — ABNORMAL LOW (ref 3.87–5.11)
RDW: 15.9 % — ABNORMAL HIGH (ref 11.5–15.5)
WBC: 5.2 K/uL (ref 4.0–10.5)
nRBC: 0 % (ref 0.0–0.2)

## 2024-04-19 LAB — HEMOGLOBIN A1C
Hgb A1c MFr Bld: 6.5 % — ABNORMAL HIGH (ref 4.8–5.6)
Mean Plasma Glucose: 140 mg/dL

## 2024-04-24 NOTE — H&P (Signed)
 TOTAL KNEE ADMISSION H&P  Patient is being admitted for right total knee arthroplasty.  Subjective:  Chief Complaint:right knee pain.  HPI: Stephanie [name], 73 y.o. female, presents for right total knee arthroplasty after having successful left total knee arthroplasty.  The patient has had knee pain for several years she has fought valiantly to try to preserve her knee without surgery but she has succumbed to the pain and interference with activities of daily living despite an adequate course of nonoperative care which included home exercises, modification of activities, oral medication and injection   Patient Active Problem List   Diagnosis Date Noted   Hypertension, essential 12/16/2022   OSA (obstructive sleep apnea) 03/13/2022   S/P total knee replacement, left 03/04/16    Arthritis of knee, degenerative 02/23/2013   ACHILLES TENDINITIS 06/04/2009   ANKLE PAIN, LEFT 12/01/2007   Past Medical History:  Diagnosis Date   Diabetes mellitus without complication (HCC)    GERD (gastroesophageal reflux disease)    Hypercholesteremia    Hypertension    Hypothyroidism    PONV (postoperative nausea and vomiting)    Sleep apnea     Past Surgical History:  Procedure Laterality Date   ABDOMINAL HYSTERECTOMY     BREAST BIOPSY Left 2018   HYALINIZED FIBROADENOMATOID NODULE WITH CALCIFICATIONS   COLONOSCOPY N/A 12/26/2012   Procedure: COLONOSCOPY;  Surgeon: Lamar CHRISTELLA Hollingshead, MD;  Location: [facility];  Service: Endoscopy;  Laterality: N/A;  9:15 AM   COLONOSCOPY WITH PROPOFOL  N/A 04/26/2023   Procedure: COLONOSCOPY WITH PROPOFOL ;  Surgeon: Hollingshead Lamar CHRISTELLA, MD;  Location: [facility];  Service: Endoscopy;  Laterality: N/A;  930am, asa 3   TOTAL KNEE ARTHROPLASTY Left 03/04/2016   Procedure: LEFT TOTAL KNEE ARTHROPLASTY;  Surgeon: Taft FORBES Minerva, MD;  Location: [facility];  Service: Orthopedics;  Laterality: Left;   TUBAL LIGATION      Current Facility-Administered Medications   Medication Dose Route Frequency Provider Last Rate Last Admin   bupivacaine -meloxicam  ER (ZYNRELEF ) injection 400 mg  400 mg Infiltration Once Oz Gammel E, MD       Current Outpatient Medications  Medication Sig Dispense Refill Last Dose/Taking   aspirin  EC 81 MG tablet Take 81 mg by mouth in the morning. Swallow whole.   Taking   Cholecalciferol  (VITAMIN D3 PO) Take 2,000 Units by mouth in the morning.   Taking   Cyanocobalamin  (VITAMIN B-12 PO) Take 1 tablet by mouth in the morning.   Taking   esomeprazole (NEXIUM) 20 MG capsule Take 20 mg by mouth daily as needed (indigestion/heartburn.).   Taking As Needed   hydrochlorothiazide  (HYDRODIURIL ) 25 MG tablet Take 25 mg by mouth in the morning.   Taking   latanoprost  (XALATAN ) 0.005 % ophthalmic solution Place 1 drop into both eyes at bedtime.   Taking   levothyroxine  (SYNTHROID ) 88 MCG tablet Take 88 mcg by mouth daily before breakfast.   Taking   losartan  (COZAAR ) 100 MG tablet Take 100 mg by mouth in the morning.   Taking   metFORMIN  (GLUCOPHAGE ) 1000 MG tablet Take 500 mg by mouth in the morning and at bedtime.   Taking   pioglitazone  (ACTOS ) 15 MG tablet Take 15 mg by mouth in the morning.   Taking   pravastatin  (PRAVACHOL ) 40 MG tablet Take 40 mg by mouth at bedtime.   Taking   No Known Allergies  Social History   Tobacco Use   Smoking status: Never   Smokeless tobacco: Never  Substance  Use Topics   Alcohol use: No    Family History  Problem Relation Age of Onset   Breast cancer Niece 23   Colon cancer Neg Hx      Review of Systems  All systems were reviewed and were negative including no history of chest pain or shortness of breath  Objective:  Physical Exam  General appearance is normal vital signs in the office were normal  She is oriented x 3  Her mood is pleasant her affect is normal  Her gait is notable for a slight limp favoring the right leg she walks well with her left total knee replacement  On  inspection she has a varus deformity to the right knee she has tenderness over the medial joint line she has a 5 to 90 degree range of motion with a stable knee strength and muscle tone is normal including intact extensor mechanism and the skin envelope is friendly for surgery  She has good pulses mild edema distally normal lymph nodes and sensation no pathologic reflexes and her balance and coordination are good  Vital signs in last 24 hours:    Labs:     Latest Ref Rng & Units 04/18/2024   10:17 AM 03/06/2016    6:34 AM 03/05/2016    5:36 AM  CBC  WBC 4.0 - 10.5 K/uL 5.2  12.3  11.3   Hemoglobin 12.0 - 15.0 g/dL 88.9  88.8  89.6   Hematocrit 36.0 - 46.0 % 34.3  34.1  31.5   Platelets 150 - 400 K/uL 280  262  272        Latest Ref Rng & Units 04/18/2024   10:17 AM 04/21/2023    9:56 AM 03/05/2016    5:36 AM  BMP  Glucose 70 - 99 mg/dL 847  888  883   BUN 8 - 23 mg/dL 21  22  13    Creatinine 0.44 - 1.00 mg/dL 8.60  8.79  9.18   Sodium 135 - 145 mmol/L 138  137  136   Potassium 3.5 - 5.1 mmol/L 4.0  3.8  4.3   Chloride 98 - 111 mmol/L 105  99  103   CO2 22 - 32 mmol/L 23  24  25    Calcium 8.9 - 10.3 mg/dL 9.6  9.8  8.9    Lab Results  Component Value Date   HGBA1C 6.5 (H) 04/18/2024      Estimated body mass index is 38.23 kg/m as calculated from the following:   Height as of 04/18/24: 5' 2 (1.575 m).   Weight as of 04/18/24: 94.8 kg.   Imaging Review Plain radiographs demonstrate moderate to severe degenerative joint disease of the right knee(s). The overall alignment ismild varus. The bone quality appears to be good for age and reported activity level.      Assessment/Plan:  End stage arthritis, right knee   The patient history, physical examination, clinical judgment of the provider and imaging studies are consistent with end stage degenerative joint disease of the right knee(s) and total knee arthroplasty is deemed medically necessary. The treatment options including  medical management, injection therapy arthroscopy and arthroplasty were discussed at length. The risks and benefits of total knee arthroplasty were presented and reviewed. The risks due to aseptic loosening, infection, stiffness, patella tracking problems, thromboembolic complications and other imponderables were discussed. The patient acknowledged the explanation, agreed to proceed with the plan and consent was signed. Patient is being admitted for inpatient treatment for surgery,  pain control, PT, OT, prophylactic antibiotics, VTE prophylaxis, progressive ambulation and ADL's and discharge planning. The patient is planning to be discharged home with home health services

## 2024-04-25 ENCOUNTER — Ambulatory Visit (HOSPITAL_COMMUNITY): Payer: Self-pay | Admitting: Certified Registered"

## 2024-04-25 ENCOUNTER — Encounter (HOSPITAL_COMMUNITY)
Admission: RE | Disposition: A | Discharge status: Home / Self Care | Payer: Self-pay | Source: Home / Self Care | Attending: Orthopedic Surgery

## 2024-04-25 ENCOUNTER — Ambulatory Visit (HOSPITAL_BASED_OUTPATIENT_CLINIC_OR_DEPARTMENT_OTHER): Payer: Self-pay | Admitting: Certified Registered"

## 2024-04-25 ENCOUNTER — Observation Stay (HOSPITAL_COMMUNITY)
Admission: RE | Admit: 2024-04-25 | Discharge: 2024-04-26 | Disposition: A | Discharge status: Home / Self Care | Attending: Orthopedic Surgery | Admitting: Orthopedic Surgery

## 2024-04-25 ENCOUNTER — Ambulatory Visit (HOSPITAL_COMMUNITY)

## 2024-04-25 ENCOUNTER — Encounter (HOSPITAL_COMMUNITY): Payer: Self-pay | Admitting: Orthopedic Surgery

## 2024-04-25 DIAGNOSIS — R609 Edema, unspecified: Secondary | ICD-10-CM | POA: Diagnosis not present

## 2024-04-25 DIAGNOSIS — Z79899 Other long term (current) drug therapy: Secondary | ICD-10-CM | POA: Diagnosis not present

## 2024-04-25 DIAGNOSIS — E039 Hypothyroidism, unspecified: Secondary | ICD-10-CM | POA: Insufficient documentation

## 2024-04-25 DIAGNOSIS — I10 Essential (primary) hypertension: Secondary | ICD-10-CM | POA: Insufficient documentation

## 2024-04-25 DIAGNOSIS — M1711 Unilateral primary osteoarthritis, right knee: Principal | ICD-10-CM | POA: Diagnosis present

## 2024-04-25 DIAGNOSIS — G8918 Other acute postprocedural pain: Secondary | ICD-10-CM | POA: Diagnosis not present

## 2024-04-25 DIAGNOSIS — Z96651 Presence of right artificial knee joint: Secondary | ICD-10-CM | POA: Diagnosis not present

## 2024-04-25 DIAGNOSIS — G4730 Sleep apnea, unspecified: Secondary | ICD-10-CM | POA: Diagnosis not present

## 2024-04-25 DIAGNOSIS — Z471 Aftercare following joint replacement surgery: Secondary | ICD-10-CM | POA: Diagnosis not present

## 2024-04-25 DIAGNOSIS — E66813 Obesity, class 3: Secondary | ICD-10-CM

## 2024-04-25 DIAGNOSIS — Z01818 Encounter for other preprocedural examination: Secondary | ICD-10-CM

## 2024-04-25 DIAGNOSIS — E119 Type 2 diabetes mellitus without complications: Secondary | ICD-10-CM | POA: Insufficient documentation

## 2024-04-25 DIAGNOSIS — Z6838 Body mass index (BMI) 38.0-38.9, adult: Secondary | ICD-10-CM

## 2024-04-25 DIAGNOSIS — Z7982 Long term (current) use of aspirin: Secondary | ICD-10-CM | POA: Diagnosis not present

## 2024-04-25 HISTORY — PX: TOTAL KNEE ARTHROPLASTY: SHX125

## 2024-04-25 LAB — GLUCOSE, CAPILLARY
Glucose-Capillary: 93 mg/dL (ref 70–99)
Glucose-Capillary: 125 mg/dL — ABNORMAL HIGH (ref 70–99)
Glucose-Capillary: 159 mg/dL — ABNORMAL HIGH (ref 70–99)
Glucose-Capillary: 216 mg/dL — ABNORMAL HIGH (ref 70–99)

## 2024-04-25 LAB — CBC
HCT: 31.8 % — ABNORMAL LOW (ref 36.0–46.0)
Hemoglobin: 9.9 g/dL — ABNORMAL LOW (ref 12.0–15.0)
MCH: 28.4 pg (ref 26.0–34.0)
MCHC: 31.1 g/dL (ref 30.0–36.0)
MCV: 91.1 fL (ref 80.0–100.0)
Platelets: 236 K/uL (ref 150–400)
RBC: 3.49 MIL/uL — ABNORMAL LOW (ref 3.87–5.11)
RDW: 15.9 % — ABNORMAL HIGH (ref 11.5–15.5)
WBC: 9.7 K/uL (ref 4.0–10.5)
nRBC: 0 % (ref 0.0–0.2)

## 2024-04-25 LAB — SURGICAL PCR SCREEN
MRSA, PCR: NEGATIVE
Staphylococcus aureus: NEGATIVE

## 2024-04-25 LAB — PREPARE RBC (CROSSMATCH)

## 2024-04-25 LAB — CREATININE, SERUM
Creatinine, Ser: 0.93 mg/dL (ref 0.44–1.00)
GFR, Estimated: >60 mL/min (ref >60)

## 2024-04-25 SURGERY — ARTHROPLASTY, KNEE, TOTAL
Anesthesia: Spinal | Site: Knee | Laterality: Right

## 2024-04-25 MED ORDER — PIOGLITAZONE HCL 15 MG PO TABS
15.0000 mg | ORAL_TABLET | Freq: Every morning | ORAL | Status: DC
  Administered 2024-04-26: 15 mg via ORAL
  Filled 2024-04-25 (×2): qty 1

## 2024-04-25 MED ORDER — MIDAZOLAM HCL 2 MG/2ML IJ SOLN
INTRAMUSCULAR | Status: AC
  Filled 2024-04-25: qty 2

## 2024-04-25 MED ORDER — ACETAMINOPHEN 325 MG PO TABS: 325.0000 mg | ORAL_TABLET | Freq: Four times a day (QID) | ORAL | Status: DC | PRN

## 2024-04-25 MED ORDER — BISACODYL 5 MG PO TBEC: 5.0000 mg | DELAYED_RELEASE_TABLET | Freq: Every day | ORAL | Status: DC | PRN

## 2024-04-25 MED ORDER — LATANOPROST 0.005 % OP SOLN
1.0000 [drp] | Freq: Every day | OPHTHALMIC | Status: DC
  Administered 2024-04-25: 1 [drp] via OPHTHALMIC
  Filled 2024-04-25: qty 2.5

## 2024-04-25 MED ORDER — SODIUM CHLORIDE 0.9 % IV SOLN: INTRAVENOUS | Status: AC

## 2024-04-25 MED ORDER — PHENOL 1.4 % MT LIQD: 1.0000 | OROMUCOSAL | Status: DC | PRN

## 2024-04-25 MED ORDER — KETOROLAC TROMETHAMINE 30 MG/ML IJ SOLN
15.0000 mg | Freq: Once | INTRAMUSCULAR | Status: AC
  Administered 2024-04-25: 15 mg via INTRAVENOUS
  Filled 2024-04-25: qty 1

## 2024-04-25 MED ORDER — METOCLOPRAMIDE HCL 5 MG/ML IJ SOLN: 5.0000 mg | Freq: Three times a day (TID) | INTRAMUSCULAR | Status: DC | PRN

## 2024-04-25 MED ORDER — DIPHENHYDRAMINE HCL 12.5 MG/5ML PO ELIX: 12.5000 mg | ORAL_SOLUTION | ORAL | Status: DC | PRN

## 2024-04-25 MED ORDER — SODIUM CHLORIDE 0.9 % IR SOLN
Status: DC | PRN
  Administered 2024-04-25: 3000 mL

## 2024-04-25 MED ORDER — POLYETHYLENE GLYCOL 3350 17 G PO PACK: 17.0000 g | PACK | Freq: Every day | ORAL | Status: DC | PRN

## 2024-04-25 MED ORDER — ACETAMINOPHEN 500 MG PO TABS
1000.0000 mg | ORAL_TABLET | Freq: Four times a day (QID) | ORAL | Status: AC
  Administered 2024-04-25 – 2024-04-26 (×3): 1000 mg via ORAL
  Filled 2024-04-25 (×3): qty 2

## 2024-04-25 MED ORDER — PROPOFOL 500 MG/50ML IV EMUL
INTRAVENOUS | Status: AC
  Filled 2024-04-25: qty 50

## 2024-04-25 MED ORDER — VITAMIN D 25 MCG (1000 UNIT) PO TABS
2000.0000 [IU] | ORAL_TABLET | Freq: Every morning | ORAL | Status: DC
  Administered 2024-04-26: 2000 [IU] via ORAL
  Filled 2024-04-25: qty 2

## 2024-04-25 MED ORDER — BUPIVACAINE-MELOXICAM ER 200-6 MG/7ML IJ SOLN
INTRAMUSCULAR | Status: DC | PRN
  Administered 2024-04-25: 400 mg

## 2024-04-25 MED ORDER — FLEET ENEMA RE ENEM: 1.0000 | ENEMA | Freq: Once | RECTAL | Status: DC | PRN

## 2024-04-25 MED ORDER — METHOCARBAMOL 1000 MG/10ML IJ SOLN: 500.0000 mg | Freq: Four times a day (QID) | INTRAMUSCULAR | Status: DC | PRN

## 2024-04-25 MED ORDER — FENTANYL CITRATE PF 50 MCG/ML IJ SOSY
PREFILLED_SYRINGE | INTRAMUSCULAR | Status: AC
  Filled 2024-04-25: qty 1

## 2024-04-25 MED ORDER — PROPOFOL 500 MG/50ML IV EMUL
INTRAVENOUS | Status: AC
Start: 2024-04-25 — End: 2024-04-25
  Filled 2024-04-25: qty 50

## 2024-04-25 MED ORDER — METOCLOPRAMIDE HCL 10 MG PO TABS: 5.0000 mg | ORAL_TABLET | Freq: Three times a day (TID) | ORAL | Status: DC | PRN

## 2024-04-25 MED ORDER — CHLORHEXIDINE GLUCONATE 0.12 % MT SOLN
15.0000 mL | Freq: Once | OROMUCOSAL | Status: AC
  Administered 2024-04-25: 15 mL via OROMUCOSAL

## 2024-04-25 MED ORDER — PHENYLEPHRINE HCL-NACL 20-0.9 MG/250ML-% IV SOLN
INTRAVENOUS | Status: DC | PRN
  Administered 2024-04-25: 15 ug/min via INTRAVENOUS

## 2024-04-25 MED ORDER — PROPOFOL 10 MG/ML IV BOLUS
INTRAVENOUS | Status: DC | PRN
  Administered 2024-04-25: 30 mg via INTRAVENOUS
  Administered 2024-04-25: 100 ug/kg/min via INTRAVENOUS

## 2024-04-25 MED ORDER — BUPIVACAINE HCL (PF) 0.75 % IJ SOLN
INTRAMUSCULAR | Status: DC | PRN
  Administered 2024-04-25: 1.8 mL via INTRATHECAL

## 2024-04-25 MED ORDER — ONDANSETRON HCL 4 MG PO TABS: 4.0000 mg | ORAL_TABLET | Freq: Four times a day (QID) | ORAL | Status: DC | PRN

## 2024-04-25 MED ORDER — LACTATED RINGERS IV SOLN: INTRAVENOUS | Status: DC

## 2024-04-25 MED ORDER — DEXAMETHASONE SODIUM PHOSPHATE 10 MG/ML IJ SOLN
INTRAMUSCULAR | Status: DC | PRN
  Administered 2024-04-25: 8 mg via INTRAVENOUS

## 2024-04-25 MED ORDER — ENOXAPARIN SODIUM 30 MG/0.3ML IJ SOSY: 30.0000 mg | PREFILLED_SYRINGE | INTRAMUSCULAR | Status: DC

## 2024-04-25 MED ORDER — ROPIVACAINE HCL 5 MG/ML IJ SOLN
INTRAMUSCULAR | Status: AC
  Filled 2024-04-25: qty 30

## 2024-04-25 MED ORDER — BUPIVACAINE HCL (PF) 0.5 % IJ SOLN
INTRAMUSCULAR | Status: DC | PRN
  Administered 2024-04-25: 30 mL

## 2024-04-25 MED ORDER — LEVOTHYROXINE SODIUM 88 MCG PO TABS
88.0000 ug | ORAL_TABLET | Freq: Every day | ORAL | Status: DC
  Administered 2024-04-26: 88 ug via ORAL
  Filled 2024-04-25: qty 1

## 2024-04-25 MED ORDER — TRANEXAMIC ACID-NACL 1000-0.7 MG/100ML-% IV SOLN
1000.0000 mg | Freq: Once | INTRAVENOUS | Status: AC
  Administered 2024-04-25: 1000 mg via INTRAVENOUS
  Filled 2024-04-25: qty 100

## 2024-04-25 MED ORDER — FENTANYL CITRATE PF 50 MCG/ML IJ SOSY
50.0000 ug | PREFILLED_SYRINGE | Freq: Once | INTRAMUSCULAR | Status: AC
  Administered 2024-04-25: 50 ug via INTRAVENOUS

## 2024-04-25 MED ORDER — INSULIN ASPART 100 UNIT/ML IJ SOLN
0.0000 [IU] | Freq: Three times a day (TID) | INTRAMUSCULAR | Status: DC
  Administered 2024-04-25: 2 [IU] via SUBCUTANEOUS
  Administered 2024-04-26: 1 [IU] via SUBCUTANEOUS

## 2024-04-25 MED ORDER — LOSARTAN POTASSIUM 50 MG PO TABS
100.0000 mg | ORAL_TABLET | Freq: Every morning | ORAL | Status: DC
  Administered 2024-04-26: 100 mg via ORAL
  Filled 2024-04-25: qty 2

## 2024-04-25 MED ORDER — PRAVASTATIN SODIUM 40 MG PO TABS
40.0000 mg | ORAL_TABLET | Freq: Every day | ORAL | Status: DC
  Administered 2024-04-25: 40 mg via ORAL
  Filled 2024-04-25: qty 1

## 2024-04-25 MED ORDER — MIDAZOLAM HCL 2 MG/2ML IJ SOLN
INTRAMUSCULAR | Status: DC | PRN
  Administered 2024-04-25 (×2): 1 mg via INTRAVENOUS

## 2024-04-25 MED ORDER — ONDANSETRON HCL 4 MG/2ML IJ SOLN
INTRAMUSCULAR | Status: DC | PRN
  Administered 2024-04-25: 4 mg via INTRAVENOUS

## 2024-04-25 MED ORDER — DOCUSATE SODIUM 100 MG PO CAPS
100.0000 mg | ORAL_CAPSULE | Freq: Two times a day (BID) | ORAL | Status: DC
  Administered 2024-04-25 – 2024-04-26 (×2): 100 mg via ORAL
  Filled 2024-04-25 (×2): qty 1

## 2024-04-25 MED ORDER — TRANEXAMIC ACID-NACL 1000-0.7 MG/100ML-% IV SOLN
1000.0000 mg | INTRAVENOUS | Status: AC
Start: 2024-04-25 — End: 2024-04-25
  Administered 2024-04-25: 1000 mg via INTRAVENOUS
  Filled 2024-04-25: qty 100

## 2024-04-25 MED ORDER — BUPIVACAINE-MELOXICAM ER 200-6 MG/7ML IJ SOLN
INTRAMUSCULAR | Status: AC
Start: 2024-04-25 — End: 2024-04-25
  Filled 2024-04-25: qty 2

## 2024-04-25 MED ORDER — PROPOFOL 10 MG/ML IV BOLUS
INTRAVENOUS | Status: AC
  Filled 2024-04-25: qty 20

## 2024-04-25 MED ORDER — HYDROCHLOROTHIAZIDE 25 MG PO TABS
25.0000 mg | ORAL_TABLET | Freq: Every morning | ORAL | Status: DC
  Administered 2024-04-26: 25 mg via ORAL
  Filled 2024-04-25: qty 1

## 2024-04-25 MED ORDER — ONDANSETRON HCL 4 MG/2ML IJ SOLN
INTRAMUSCULAR | Status: AC
Start: 2024-04-25 — End: 2024-04-25
  Filled 2024-04-25: qty 2

## 2024-04-25 MED ORDER — PHENYLEPHRINE HCL-NACL 20-0.9 MG/250ML-% IV SOLN
INTRAVENOUS | Status: AC
  Filled 2024-04-25: qty 250

## 2024-04-25 MED ORDER — PHENYLEPHRINE 80 MCG/ML (10ML) SYRINGE FOR IV PUSH (FOR BLOOD PRESSURE SUPPORT)
PREFILLED_SYRINGE | INTRAVENOUS | Status: AC
Start: 2024-04-25 — End: 2024-04-25
  Filled 2024-04-25: qty 10

## 2024-04-25 MED ORDER — METHOCARBAMOL 500 MG PO TABS
500.0000 mg | ORAL_TABLET | Freq: Four times a day (QID) | ORAL | Status: DC | PRN
  Administered 2024-04-25 – 2024-04-26 (×2): 500 mg via ORAL
  Filled 2024-04-25 (×2): qty 1

## 2024-04-25 MED ORDER — GLYCOPYRROLATE PF 0.2 MG/ML IJ SOSY
PREFILLED_SYRINGE | INTRAMUSCULAR | Status: AC
  Filled 2024-04-25: qty 1

## 2024-04-25 MED ORDER — 0.9 % SODIUM CHLORIDE (POUR BTL) OPTIME
TOPICAL | Status: DC | PRN
  Administered 2024-04-25: 1000 mL

## 2024-04-25 MED ORDER — ENOXAPARIN SODIUM 40 MG/0.4ML IJ SOSY
40.0000 mg | PREFILLED_SYRINGE | INTRAMUSCULAR | Status: DC
  Administered 2024-04-26: 40 mg via SUBCUTANEOUS
  Filled 2024-04-25: qty 0.4

## 2024-04-25 MED ORDER — ONDANSETRON HCL 4 MG/2ML IJ SOLN
4.0000 mg | Freq: Four times a day (QID) | INTRAMUSCULAR | Status: DC | PRN
  Administered 2024-04-26: 4 mg via INTRAVENOUS
  Filled 2024-04-25: qty 2

## 2024-04-25 MED ORDER — POVIDONE-IODINE 10 % EX SWAB: 2.0000 | Freq: Once | CUTANEOUS | Status: DC

## 2024-04-25 MED ORDER — OXYCODONE HCL 5 MG PO TABS
5.0000 mg | ORAL_TABLET | ORAL | Status: DC | PRN
  Administered 2024-04-26: 5 mg via ORAL
  Filled 2024-04-25: qty 1

## 2024-04-25 MED ORDER — CEFAZOLIN SODIUM-DEXTROSE 2-4 GM/100ML-% IV SOLN
2.0000 g | Freq: Four times a day (QID) | INTRAVENOUS | Status: AC
  Administered 2024-04-25 (×2): 2 g via INTRAVENOUS
  Filled 2024-04-25 (×2): qty 100

## 2024-04-25 MED ORDER — PANTOPRAZOLE SODIUM 40 MG PO TBEC
40.0000 mg | DELAYED_RELEASE_TABLET | Freq: Every day | ORAL | Status: DC
  Administered 2024-04-25 – 2024-04-26 (×2): 40 mg via ORAL
  Filled 2024-04-25 (×2): qty 1

## 2024-04-25 MED ORDER — ORAL CARE MOUTH RINSE: 15.0000 mL | Freq: Once | OROMUCOSAL | Status: AC

## 2024-04-25 MED ORDER — VITAMIN B-12 1000 MCG PO TABS
1000.0000 ug | ORAL_TABLET | Freq: Every morning | ORAL | Status: DC
  Administered 2024-04-26: 1000 ug via ORAL
  Filled 2024-04-25: qty 1

## 2024-04-25 MED ORDER — DEXAMETHASONE SODIUM PHOSPHATE 10 MG/ML IJ SOLN
INTRAMUSCULAR | Status: AC
Start: 2024-04-25 — End: 2024-04-25
  Filled 2024-04-25: qty 1

## 2024-04-25 MED ORDER — ACETAMINOPHEN 500 MG PO TABS
1000.0000 mg | ORAL_TABLET | Freq: Four times a day (QID) | ORAL | Status: DC
Start: 2024-04-25 — End: 2024-04-25

## 2024-04-25 MED ORDER — HYDROMORPHONE HCL 1 MG/ML IJ SOLN: 0.5000 mg | INTRAMUSCULAR | Status: DC | PRN

## 2024-04-25 MED ORDER — PHENYLEPHRINE 80 MCG/ML (10ML) SYRINGE FOR IV PUSH (FOR BLOOD PRESSURE SUPPORT)
PREFILLED_SYRINGE | INTRAVENOUS | Status: DC | PRN
  Administered 2024-04-25 (×2): 80 ug via INTRAVENOUS

## 2024-04-25 MED ORDER — CEFAZOLIN SODIUM-DEXTROSE 2-4 GM/100ML-% IV SOLN
2.0000 g | INTRAVENOUS | Status: AC
  Administered 2024-04-25: 2 g via INTRAVENOUS
  Filled 2024-04-25: qty 100

## 2024-04-25 MED ORDER — GLYCOPYRROLATE PF 0.2 MG/ML IJ SOSY
PREFILLED_SYRINGE | INTRAMUSCULAR | Status: DC | PRN
  Administered 2024-04-25: .1 mg via INTRAVENOUS

## 2024-04-25 MED ORDER — DEXAMETHASONE SODIUM PHOSPHATE 4 MG/ML IJ SOLN: 8.0000 mg | Freq: Once | INTRAMUSCULAR | Status: DC

## 2024-04-25 MED ORDER — METFORMIN HCL 500 MG PO TABS
500.0000 mg | ORAL_TABLET | Freq: Two times a day (BID) | ORAL | Status: DC
Start: 2024-04-26 — End: 2024-04-26
  Administered 2024-04-26: 500 mg via ORAL
  Filled 2024-04-25: qty 1

## 2024-04-25 MED ORDER — MENTHOL 3 MG MT LOZG: 1.0000 | LOZENGE | OROMUCOSAL | Status: DC | PRN

## 2024-04-25 MED ORDER — OXYCODONE HCL 5 MG PO TABS
5.0000 mg | ORAL_TABLET | ORAL | Status: DC | PRN
  Administered 2024-04-25: 5 mg via ORAL
  Filled 2024-04-25: qty 1

## 2024-04-25 SURGICAL SUPPLY — 52 items
ATTUNE PSFEM RTSZ4 NARCEM KNEE (Femur) IMPLANT
BANDAGE ESMARK 6X9 LF (GAUZE/BANDAGES/DRESSINGS) ×2 IMPLANT
BASEPLATE TIB CMT FB PCKT SZ3 (Knees) IMPLANT
BLADE SAG 18X100X1.27 (BLADE) IMPLANT
BLADE SAGITTAL 25.0X1.27X90 (BLADE) ×2 IMPLANT
BLADE SAW SAG 90X13X1.27 (BLADE) IMPLANT
CEMENT HV SMART SET (Cement) ×4 IMPLANT
CLOTH BEACON ORANGE TIMEOUT ST (SAFETY) ×2 IMPLANT
COOLER ICEMAN CLASSIC (MISCELLANEOUS) ×2 IMPLANT
COUNTER NDL MAGNETIC 40 RED (SET/KITS/TRAYS/PACK) ×2 IMPLANT
COUNTER NEEDLE MAGNETIC 40 RED (SET/KITS/TRAYS/PACK) ×1 IMPLANT
COVER LIGHT HANDLE STERIS (MISCELLANEOUS) ×4 IMPLANT
CUFF TRNQT CYL 34X4.125X (TOURNIQUET CUFF) ×2 IMPLANT
DRAPE BACK TABLE (DRAPES) ×2 IMPLANT
DRAPE EXTREMITY T 121X128X90 (DISPOSABLE) ×2 IMPLANT
DRESSING AQUACEL AG ADV 3.5X12 (MISCELLANEOUS) ×2 IMPLANT
DURAPREP 26ML APPLICATOR (WOUND CARE) ×4 IMPLANT
ELECTRODE REM PT RTRN 9FT ADLT (ELECTROSURGICAL) ×2 IMPLANT
GLOVE BIOGEL PI IND STRL 7.0 (GLOVE) ×12 IMPLANT
GLOVE BIOGEL PI IND STRL 8.5 (GLOVE) ×2 IMPLANT
GLOVE SKINSENSE STRL SZ8.0 LF (GLOVE) ×2 IMPLANT
GOWN STRL REUS W/TWL LRG LVL3 (GOWN DISPOSABLE) ×6 IMPLANT
GOWN STRL REUS W/TWL XL LVL3 (GOWN DISPOSABLE) ×2 IMPLANT
HOOD PEEL AWAY T7 (MISCELLANEOUS) ×8 IMPLANT
INSERT TIB ATTUNE FB SZ4X7 (Insert) IMPLANT
INST SET MAJOR BONE (KITS) ×2 IMPLANT
KIT TURNOVER KIT A (KITS) ×2 IMPLANT
MANIFOLD NEPTUNE II (INSTRUMENTS) ×2 IMPLANT
MARKER SKIN DUAL TIP RULER LAB (MISCELLANEOUS) ×2 IMPLANT
NS IRRIG 1000ML POUR BTL (IV SOLUTION) ×2 IMPLANT
PACK TOTAL JOINT (CUSTOM PROCEDURE TRAY) ×2 IMPLANT
PAD ARMBOARD POSITIONER FOAM (MISCELLANEOUS) ×2 IMPLANT
PAD COLD SHLDR SM WRAP-ON (PAD) ×2 IMPLANT
PATELLA MEDIAL ATTUN 35MM KNEE (Knees) IMPLANT
PENCIL SMOKE EVACUATOR COATED (MISCELLANEOUS) ×2 IMPLANT
PILLOW KNEE EXTENSION 0 DEG (MISCELLANEOUS) ×2 IMPLANT
PIN STEINMAN FIXATION KNEE (PIN) ×2 IMPLANT
POSITIONER HEAD 8X9X4 ADT (SOFTGOODS) ×2 IMPLANT
SET BASIN LINEN APH (SET/KITS/TRAYS/PACK) ×2 IMPLANT
SET HNDPC FAN SPRY TIP SCT (DISPOSABLE) ×2 IMPLANT
SOL .9 NS 3000ML IRR UROMATIC (IV SOLUTION) ×2 IMPLANT
SOLUTION IRRIG SURGIPHOR (IV SOLUTION) ×2 IMPLANT
SUT BRALON NAB BRD #1 30IN (SUTURE) ×2 IMPLANT
SUT MNCRL 0 VIOLET CTX 36 (SUTURE) ×2 IMPLANT
SUT MON AB 0 CT1 (SUTURE) ×2 IMPLANT
SYR BULB IRRIG 60ML STRL (SYRINGE) ×2 IMPLANT
TOWEL OR 17X26 4PK STRL BLUE (TOWEL DISPOSABLE) ×2 IMPLANT
TOWER CARTRIDGE SMART MIX (DISPOSABLE) ×2 IMPLANT
TRAY FOLEY MTR SLVR 16FR STAT (SET/KITS/TRAYS/PACK) ×2 IMPLANT
TUBING CONNECTOR 18X5MM (MISCELLANEOUS) ×2 IMPLANT
WATER STERILE IRR 1000ML POUR (IV SOLUTION) ×4 IMPLANT
YANKAUER SUCT 12FT TUBE ARGYLE (SUCTIONS) ×2 IMPLANT

## 2024-04-25 NOTE — Progress Notes (Signed)
 Assisted Dr. Herschell with adductor canal block. Side rails up, monitors on throughout procedure. See vital signs in flow sheet. Tolerated Procedure well.

## 2024-04-25 NOTE — Anesthesia Preprocedure Evaluation (Addendum)
 Anesthesia Evaluation  Patient identified by MRN, date of birth, ID band Patient awake    Reviewed: Allergy & Precautions, H&P , NPO status , Patient's Chart, lab work & pertinent test results  History of Anesthesia Complications (+) PONV and history of anesthetic complications  Airway Mallampati: III  TM Distance: >3 FB Neck ROM: Full    Dental no notable dental hx.    Pulmonary sleep apnea    Pulmonary exam normal breath sounds clear to auscultation       Cardiovascular hypertension, Normal cardiovascular exam Rhythm:Regular Rate:Normal     Neuro/Psych negative neurological ROS  negative psych ROS   GI/Hepatic Neg liver ROS,GERD  ,,  Endo/Other  diabetes, Type 2, Oral Hypoglycemic AgentsHypothyroidism  Class 3 obesity  Renal/GU negative Renal ROS  negative genitourinary   Musculoskeletal  (+) Arthritis ,    Abdominal   Peds negative pediatric ROS (+)  Hematology negative hematology ROS (+)   Anesthesia Other Findings   Reproductive/Obstetrics negative OB ROS                              Anesthesia Physical Anesthesia Plan  ASA: 3  Anesthesia Plan: Spinal   Post-op Pain Management: Regional block*   Induction: Intravenous  PONV Risk Score and Plan:   Airway Management Planned: Nasal Cannula  Additional Equipment:   Intra-op Plan:   Post-operative Plan:   Informed Consent: I have reviewed the patients History and Physical, chart, labs and discussed the procedure including the risks, benefits and alternatives for the proposed anesthesia with the patient or authorized representative who has indicated his/her understanding and acceptance.     Dental advisory given  Plan Discussed with: CRNA  Anesthesia Plan Comments:          Anesthesia Quick Evaluation

## 2024-04-25 NOTE — TOC CM/SW Note (Signed)
 Transition of Care [facility]) - Inpatient Brief Assessment   Patient Details  Name: Stephanie [name] MRN: [id number] Date of Birth: [birth year]  [facility] of [facility]) CM/SW Contact:    Noreen KATHEE Pinal, LCSWA Phone Number: 04/25/2024, 4:13 PM   Clinical Narrative:   Inpatient Care Management (ICM) has reviewed patient and no ICM needs have been identified at this time. We will continue to monitor patient advancement through interdisciplinary progression rounds. If new patient transition needs arise, please place a ICM consult.  Transition of Care Asessment: Insurance and Status: Insurance coverage has been reviewed Patient has primary care physician: Yes Home environment has been reviewed: Single Family Home Prior level of function:: Independent Prior/Current Home Services: No current home services Social Drivers of Health Review: SDOH reviewed no interventions necessary Readmission risk has been reviewed: Yes Transition of care needs: no transition of care needs at this time

## 2024-04-25 NOTE — Brief Op Note (Signed)
 04/25/2024  9:45 AM  PATIENT:  Stephanie [name]  73 y.o. female  PRE-OPERATIVE DIAGNOSIS:  Osteoarthritis right knee  POST-OPERATIVE DIAGNOSIS:  Osteoarthritis right knee  PROCEDURE:  Procedure(s): ARTHROPLASTY, KNEE, TOTAL (Right)  SURGEON:  Surgeons and Role:    * Margrette Taft BRAVO, MD - Primary  PHYSICIAN ASSISTANT:   ASSISTANTS: Montie Eke and Kristi Free  ANESTHESIA:   spinal  EBL:  150 mL   BLOOD ADMINISTERED:none  DRAINS: none   LOCAL MEDICATIONS USED:  OTHER ZINRELEF  SPECIMEN:  No Specimen  DISPOSITION OF SPECIMEN:  N/A  COUNTS:  YES  TOURNIQUET:   Total Tourniquet Time Documented: Thigh (Right) - 74 minutes Total: Thigh (Right) - 74 minutes   DICTATION: .Nechama Dictation  PLAN OF CARE: Admit for overnight observation  PATIENT DISPOSITION:  PACU - hemodynamically stable.   Delay start of Pharmacological VTE agent (>24hrs) due to surgical blood loss or risk of bleeding: yes

## 2024-04-25 NOTE — Plan of Care (Signed)
  Problem: Acute Rehab PT Goals(only PT should resolve) Goal: Pt Will Go Supine/Side To Sit Outcome: Progressing Flowsheets (Taken 04/25/2024 1603) Pt will go Supine/Side to Sit: with modified independence Note: With HOB flat Goal: Patient Will Transfer Sit To/From Stand Outcome: Progressing Flowsheets (Taken 04/25/2024 1603) Patient will transfer sit to/from stand: with contact guard assist Goal: Pt Will Transfer Bed To Chair/Chair To Bed Outcome: Progressing Flowsheets (Taken 04/25/2024 1603) Pt will Transfer Bed to Chair/Chair to Bed: with contact guard assist Goal: Pt Will Ambulate Outcome: Progressing Flowsheets (Taken 04/25/2024 1603) Pt will Ambulate:  with contact guard assist  25 feet  with rolling walker

## 2024-04-25 NOTE — Interval H&P Note (Signed)
 History and Physical Interval Note:  04/25/2024 7:24 AM  Stephanie [name]  has presented today for surgery, with the diagnosis of osteoarthritis right knee.  The various methods of treatment have been discussed with the patient and family. After consideration of risks, benefits and other options for treatment, the patient has consented to  Procedure(s): ARTHROPLASTY, KNEE, TOTAL (Right) as a surgical intervention.  The patient's history has been reviewed, patient examined, no change in status, stable for surgery.  I have reviewed the patient's chart and labs.  Questions were answered to the patient's satisfaction.     [name]

## 2024-04-25 NOTE — Addendum Note (Signed)
 Addendum  created 04/25/24 1117 by Para Jerelene CROME, CRNA   Clinical Note Signed

## 2024-04-25 NOTE — Anesthesia Procedure Notes (Addendum)
 Spinal  Patient location during procedure: OR Start time: 04/25/2024 7:31 AM End time: 04/25/2024 7:36 AM Reason for block: surgical anesthesia Staffing Performed: resident/CRNA  Anesthesiologist: Herschell Hollering, MD Resident/CRNA: Para Jerelene CROME, CRNA Performed by: Para Jerelene CROME, CRNA Authorized by: Para Jerelene CROME, CRNA   Preanesthetic Checklist Completed: patient identified, IV checked, site marked, risks and benefits discussed, surgical consent, monitors and equipment checked, pre-op evaluation and timeout performed Spinal Block Patient position: sitting Prep: ChloraPrep Patient monitoring: heart rate, cardiac monitor, continuous pulse ox and blood pressure Approach: midline Location: L3-4 Injection technique: single-shot Needle Needle type: Pencan  Needle gauge: 24 G Needle length: 10 cm (10.2 cm) Assessment Sensory level: T6 Additional Notes Smooth atraumatic insertion of SAB by Jerelene Para, CRNA. Patient tolerated procedure well. No apparent anesthetic complications observed.  Stephanie [name] Lot# [id number] Exp. 02/13/2025

## 2024-04-25 NOTE — Progress Notes (Signed)
 Called patients husband and informed him that patient was doing well and that patient will be going to room 320.  Informed him he could go ahead and go upstairs.  Rex, patient husband, voiced understanding.

## 2024-04-25 NOTE — Evaluation (Signed)
 Physical Therapy Evaluation Patient Details Name: Stephanie [name] MRN: [id number] DOB: 1950/11/09 Today's Date: 04/25/2024  RIGHT KNEE ROM: __84__ degrees AMBULATION DISTANCE: __5__ feet   History of Present Illness  Stephanie [name], 73 y.o. female, presents for right knee pain s/p right total knee arthroplasty 04/25/2024  Clinical Impression  Patient demonstrates slow labored movement for sitting up at bedside but is able to self complete with HOB elevated and increased time. Patient demonstrates increased difficulty with STS transfer but has good carryover for powering up on nonsurgical LE to boost from standard surfaces. Patient tolerated side stepping at bedside RW but unable to further progress ambulation at evaluation due to frequent episodes of n/d and one episode of emesis. Patient will benefit from continued skilled physical therapy in hospital and recommended venue below to increase strength, balance, endurance for safe ADLs and gait.        If plan is discharge home, recommend the following: A little help with walking and/or transfers;Help with stairs or ramp for entrance;Assist for transportation   Can travel by private vehicle        Equipment Recommendations None recommended by PT  Recommendations for Other Services       Functional Status Assessment Patient has had a recent decline in their functional status and demonstrates the ability to make significant improvements in function in a reasonable and predictable amount of time.     Precautions / Restrictions Precautions Precautions: Fall Recall of Precautions/Restrictions: Intact Restrictions Weight Bearing Restrictions Per Provider Order: Yes RLE Weight Bearing Per Provider Order: Weight bearing as tolerated      Mobility  Bed Mobility Overal bed mobility: Needs Assistance Bed Mobility: Supine to Sit     Supine to sit: Modified independent (Device/Increase time), HOB elevated     General bed  mobility comments: labored movement, increased time, HOB slightly elevated    Transfers Overall transfer level: Needs assistance Equipment used: Rolling walker (2 wheels) Transfers: Sit to/from Stand, Bed to chair/wheelchair/BSC Sit to Stand: Min assist   Step pivot transfers: Contact guard assist       General transfer comment: Pt demonstrates good carryover for powering up on nonsurgical leg to achieve standing; slightly unsteady, minA for safety    Ambulation/Gait Ambulation/Gait assistance: Min assist Gait Distance (Feet): 5 Feet Assistive device: Rolling walker (2 wheels) Gait Pattern/deviations: Decreased step length - right, Decreased step length - left, Decreased stride length, Decreased weight shift to right Gait velocity: decreased     General Gait Details: Limited to side stepping at bedside secondary to Rt knee pain and nausea  Stairs            Wheelchair Mobility     Tilt Bed    Modified Rankin (Stroke Patients Only)       Balance Overall balance assessment: Needs assistance Sitting-balance support: Feet supported, No upper extremity supported Sitting balance-Leahy Scale: Fair Sitting balance - Comments: fair seated EOB     Standing balance-Leahy Scale: Poor Standing balance comment: poor/fair using RW                             Pertinent Vitals/Pain Pain Assessment Pain Assessment: Faces Faces Pain Scale: Hurts whole lot Pain Location: Rt knee Pain Descriptors / Indicators: Aching, Discomfort Pain Intervention(s): Monitored during session, Limited activity within patient's tolerance, Repositioned    Home Living Family/patient expects to be discharged to:: Private residence Living Arrangements: Spouse/significant other Available Help at  Discharge: Family;Available PRN/intermittently Type of Home: House Home Access: Level entry       Home Layout: Two level;Able to live on main level with bedroom/bathroom Home Equipment:  Rolling Walker (2 wheels)      Prior Function Prior Level of Function : Independent/Modified Independent;Driving             Mobility Comments: Independent household and community ambulator ADLs Comments: Independent with ADLs     Extremity/Trunk Assessment        Lower Extremity Assessment Lower Extremity Assessment: RLE deficits/detail RLE Deficits / Details: s/p TKA RLE: Unable to fully assess due to pain       Communication        Cognition Arousal: Alert Behavior During Therapy: WFL for tasks assessed/performed   PT - Cognitive impairments: No apparent impairments                         Following commands: Intact       Cueing Cueing Techniques: Verbal cues, Tactile cues     General Comments General comments (skin integrity, edema, etc.): Rt knee bandage intact    Exercises Total Joint Exercises Ankle Circles/Pumps: AROM, 10 reps, Supine, Right Quad Sets: Right, 10 reps, Supine, AROM, Strengthening Heel Slides: 5 reps, AROM, Right, Supine Goniometric ROM: 84   Assessment/Plan    PT Assessment Patient needs continued PT services  PT Problem List Decreased strength;Decreased mobility;Decreased range of motion;Decreased activity tolerance;Decreased balance       PT Treatment Interventions Therapeutic activities;Therapeutic exercise;DME instruction;Balance training;Functional mobility training;Stair training;Patient/family education;Gait training    PT Goals (Current goals can be found in the Care Plan section)  Acute Rehab PT Goals Patient Stated Goal: returnh home PT Goal Formulation: With patient/family Time For Goal Achievement: 05/09/24 Potential to Achieve Goals: Good    Frequency Min 5X/week     Co-evaluation               AM-PAC PT 6 Clicks Mobility  Outcome Measure Help needed turning from your back to your side while in a flat bed without using bedrails?: A Little Help needed moving from lying on your back to  sitting on the side of a flat bed without using bedrails?: A Little Help needed moving to and from a bed to a chair (including a wheelchair)?: A Little Help needed standing up from a chair using your arms (e.g., wheelchair or bedside chair)?: A Little Help needed to walk in hospital room?: A Lot Help needed climbing 3-5 steps with a railing? : A Lot 6 Click Score: 16    End of Session Equipment Utilized During Treatment: Gait belt Activity Tolerance: Patient limited by pain;Other (comment) (Nausea) Patient left: in chair;with family/visitor present;with call bell/phone within reach Nurse Communication: Mobility status PT Visit Diagnosis: Unsteadiness on feet (R26.81);Other abnormalities of gait and mobility (R26.89);Muscle weakness (generalized) (M62.81)    Time: 8497-8462 PT Time Calculation (min) (ACUTE ONLY): 35 min   Charges:   PT Evaluation $PT Eval Moderate Complexity: 1 Mod PT Treatments $Therapeutic Activity: 23-37 mins PT General Charges $$ ACUTE PT VISIT: 1 Visit         4:02 PM, 04/25/24,  Sigrid Schwebach, SPT

## 2024-04-25 NOTE — Anesthesia Procedure Notes (Addendum)
 Anesthesia Regional Block: Adductor canal block   Pre-Anesthetic Checklist: , timeout performed,  Correct Patient, Correct Site, Correct Laterality,  Correct Procedure, Correct Position, site marked,  Risks and benefits discussed,  Surgical consent,  Pre-op evaluation,  At surgeon's request and post-op pain management  Laterality: Right  Prep: chloraprep       Needles:  Injection technique: Single-shot      Needle Length: 5cm      Additional Needles:   Procedures: Doppler guided,,,, ultrasound used (permanent image in chart),,    Narrative:  Start time: 04/25/2024 10:20 AM End time: 04/25/2024 10:30 AM Injection made incrementally with aspirations every 5 mL.  Additional Notes: Poor visualization Pt tolerated the procedure well

## 2024-04-25 NOTE — Anesthesia Postprocedure Evaluation (Signed)
 Anesthesia Post Note  Patient: Stephanie [name]  Procedure(s) Performed: ARTHROPLASTY, KNEE, TOTAL (Right: Knee)  Patient location during evaluation: PACU Anesthesia Type: Spinal Level of consciousness: oriented and awake and alert Pain management: pain level controlled Vital Signs Assessment: post-procedure vital signs reviewed and stable Respiratory status: spontaneous breathing, respiratory function stable and patient connected to nasal cannula oxygen Cardiovascular status: blood pressure returned to baseline and stable Postop Assessment: no headache, no backache and no apparent nausea or vomiting Anesthetic complications: no   No notable events documented.   Last Vitals:  Vitals:   04/25/24 1015 04/25/24 1030  BP: 131/78 120/74  Pulse: 86 84  Resp: (!) 21 16  Temp:    SpO2: 100% 100%    Last Pain:  Vitals:   04/25/24 0950  TempSrc:   PainSc: Asleep                 Andrea Limes

## 2024-04-25 NOTE — Op Note (Signed)
 04/25/2024  9:45 AM  PATIENT:  Stephanie [name]  73 y.o. female  PRE-OPERATIVE DIAGNOSIS:  Osteoarthritis right knee  POST-OPERATIVE DIAGNOSIS:  Osteoarthritis right knee  PROCEDURE:  Procedure(s): ARTHROPLASTY, KNEE, TOTAL (Right)  SURGEON:  Surgeons and Role:    * Margrette Taft BRAVO, MD - Primary  PHYSICIAN ASSISTANT:   ASSISTANTS: Montie Eke and Kristi Free  ANESTHESIA:   spinal  EBL:  150 mL   BLOOD ADMINISTERED:none  DRAINS: none   LOCAL MEDICATIONS USED:  OTHER ZINRELEF  SPECIMEN:  No Specimen  DISPOSITION OF SPECIMEN:  N/A  COUNTS:  YES  TOURNIQUET:   Total Tourniquet Time Documented: Thigh (Right) - 74 minutes Total: Thigh (Right) - 74 minutes   DICTATION: .Nechama Dictation  PLAN OF CARE: Admit for overnight observation  PATIENT DISPOSITION:  PACU - hemodynamically stable.   Delay start of Pharmacological VTE agent (>24hrs) due to surgical blood loss or risk of bleeding: yes   Dictation for total knee replacement  Orthopaedic Surgery Operative Note (CSN: [id number])  Stephanie [name]  1950/08/24 Date of Surgery: 04/25/2024     Operative Finding Moderate varus deformity slight flexion contracture preop passive flexion only 90 degrees Posterior osteophytes medial and lateral femur and medial tibia all were removed patellofemoral arthritis grade 4 as well medial side grade 4 lateral side intact including lateral meniscus.  Medial meniscus intact with fraying ACL and PCL intact with stenotic notch  Bone cuts Distal femur - 11  Proximal tibia - 9 (11)  Patella - 21.5 CUT 9.5 = 12    Implants: Implant Name Type Inv. Item Serial No. Manufacturer Lot No. LRB No. Used Action  CEMENT HV SMART SET - [id number] Cement CEMENT HV SMART SET  [employer] [id number] Right 1 Implanted  PATELLA MEDIAL ATTUN KNEE - [id number] Knees PATELLA MEDIAL ATTUN KNEE  DEPU[employer] [id number] Right 1 Implanted  ATTUNE PSFEM RTSZ4  NARCEM KNEE - [id number] Femur ATTUNE PSFEM RTSZ4 NARCEM KNEE  [employer]PU[employer] [id number] Right 1 Implanted  BASEPLATE TIB CMT FB PCKT SZ3 - [id number] Knees BASEPLATE TIB CMT FB PCKT SZ3  [employer]PU[employer] [id number] Right 1 Implanted  INSERT TIB ATTUNE FB SZ4X7 - [id number] Insert INSERT TIB ATTUNE FB SZ4X7  DEPU[employer] ORTH[employer] [id number] Right 1 Implanted     Indications for Surgery:   Disabling pain of the  RIGHT  knee which did not improve despite nonoperative measures. The benefits and risks of operative and nonoperative management were discussed prior to surgery with patient/guardian(s) and informed consent form was completed.  While all risks cannot be anticipated, specific risks including infection, need for additional surgery, stiffness, postop pain, infection, implant removal, loosening, infection requiring amputation, deep vein thrombosis, pulmonary embolus were discussed.   Procedure:    Details of surgery: The patient was identified by 2 approved identification mechanisms. The operative extremity was evaluated and found to be acceptable for surgical treatment today. The chart was reviewed. The surgical site was confirmed and marked over the  RIGHT  knee   Saphenous nerve block was planned and completed in the PACU : YES    The patient was taken to the operating room and given 2 GM ANCEF   antibiotic.  This is consistent with the SCIP protocol.  The patient was given the following anesthetic: SPINAL WITH POST OP SAPHENOUS N BLOCK   The patient was then placed supine on the operating table. A Foley catheter was inserted. The operative extremity was prepped and draped sterilely from the toes  to the groin.  Timeout was executed confirming the patient's name, surgical site, antibiotic administration, x-rays available, and implants available.  The operative limb,  was exsanguinated with a six-inch Esmarch and the tourniquet was inflated to 280 mmHg.  A straight midline incision was  made over the RIGHT  KNEE and taken down to the extensor mechanism. A medial arthrotomy was performed. The patella was everted and the patellofemoral soft tissue was released, along with the patellar fat pad.  The anterior cruciate ligament and PCL were resected. The anterior horns of the lateral and medial meniscus were resected. The medial soft tissue sleeve was elevated to the mid coronal plane.  Several osteophytes were removed from the tibia and femur as well as the patella   A three-eighths inch drill bit was used to enter the femoral canal which was decompressed with suction and irrigation until clear.  The distal femoral cutting guide was set for 11 mm distal resection,  5valgus alignment, for a right knee. The distal femur was resected and checked for flatness. The attune sizing femoral guide was placed and the femur was preliminarily sized to a size 4.    The external alignment guide for the tibial resection was then applied to the distal and proximal tibia and set for 3 degree posterior slope along with 9 MM resection  from the lateral.  Rotational alignment was set using the malleolus, the tibial tubercle and the tibial spines. The proximal tibia was resected along with  residual menisci.  And measured 11 mm including the articular cartilage from the lateral side the tibia was sized using a base plate to a size 3.    The extension gap was checked.  The spacer block which balanced extension gap was a size 7 The femur size was rechecked and measured a 4 The femoral cutting block was placed.  The spacer block which balanced extension gap was placed under the 4-in-1 femoral cutting block.  An assessment was made at this point as to whether the block should be moved up or down or left in place.    The size 7 spacer block was placed in the flexion gap and gave good stability  Rotation was assessed using Whitesides line and the epicondyles.  Once I was satisfied with the spacer block  collateral ligament retractors were placed and I completed the 4 distal femoral cuts   The extension gap was rechecked with the size 7 spacer block.  The flexion and extension gaps were now balanced and I proceeded to cut the notch in the femur. The correct sized notch cutting guide for the femur was then applied and the notch cut was made.   Trial reduction was completed using size 4 femur, 3 tibia, 7 poly trial implants. Patella tracking was normal  We then skeletonized the patella. It measured  21.5 in thickness and the patellar resection was set for 9.5 millimeters. the patellar resection was completed by hand. The patella diameter measured 35. We then drilled the peg holes for the patella.  Completed patellar thickness was 12+ the poly  The proximal tibia was prepared using the size 3 base plate.  Thorough irrigation was performed using saline  and the bone was dried and prepared for cement. The cement was mixed on the back table using third generation preparation techniques  The implants were then cemented in place and excess cement was removed. The cement was allowed to cure.  Normal saline irrigation followed by Surgipor irrigation followed by normal  saline irrigation  Any excess bone fragments and cement were removed.  The tourniquet was released and any bleeding was coagulated  Closure was done with the following sutures  #1 Surgilon in interrupted fashion.  The Zen relief was injected prior to complete closure.  Interrupted 0 Monocryl  Skin approximation was performed using staples  A sterile dressing was applied, TED hose were placed on the operative extremity followed by Cryo/Cuff.  The patient was taken recovery room in stable condition  Postop plan: Weightbearing as tolerated CPM machine Immediate physical therapy Discharge tomorrow if stable

## 2024-04-25 NOTE — Transfer of Care (Addendum)
 Immediate Anesthesia Transfer of Care Note  Patient: Stephanie [name]  Procedure(s) Performed: ARTHROPLASTY, KNEE, TOTAL (Right: Knee)  Patient Location: PACU  Anesthesia Type:Spinal  Level of Consciousness: drowsy and patient cooperative  Airway & Oxygen Therapy: Patient Spontanous Breathing and Patient connected to face mask oxygen  Post-op Assessment: Report given to RN and Post -op Vital signs reviewed and stable  Post vital signs: Reviewed and stable  Last Vitals:  Vitals Value Taken Time  BP 128/65 04/25/24   0950  Temp 36.8 04/25/24   0950  Pulse 92 04/25/24   0950  Resp 19 04/25/24   0950  SpO2 100% 04/25/24   0950    Last Pain:  Vitals:   04/25/24 0714  TempSrc: Oral  PainSc: 0-No pain      Patients Stated Pain Goal: 5 (04/25/24 0714)  Complications: No notable events documented.

## 2024-04-25 NOTE — Anesthesia Procedure Notes (Deleted)
 Anesthesia Regional Block: Adductor canal block   Pre-Anesthetic Checklist: , timeout performed,  Correct Patient, Correct Site, Correct Laterality,  Correct Procedure,, at surgeon's request and post-op pain management  Laterality: Right          Narrative:

## 2024-04-26 ENCOUNTER — Encounter (HOSPITAL_COMMUNITY): Payer: Self-pay | Admitting: Orthopedic Surgery

## 2024-04-26 DIAGNOSIS — M1711 Unilateral primary osteoarthritis, right knee: Secondary | ICD-10-CM | POA: Diagnosis not present

## 2024-04-26 LAB — CBC
HCT: 26.5 % — ABNORMAL LOW (ref 36.0–46.0)
Hemoglobin: 8.5 g/dL — ABNORMAL LOW (ref 12.0–15.0)
MCH: 28.4 pg (ref 26.0–34.0)
MCHC: 32.1 g/dL (ref 30.0–36.0)
MCV: 88.6 fL (ref 80.0–100.0)
Platelets: 230 K/uL (ref 150–400)
RBC: 2.99 MIL/uL — ABNORMAL LOW (ref 3.87–5.11)
RDW: 15.9 % — ABNORMAL HIGH (ref 11.5–15.5)
WBC: 10 K/uL (ref 4.0–10.5)
nRBC: 0 % (ref 0.0–0.2)

## 2024-04-26 LAB — BASIC METABOLIC PANEL WITH GFR
Anion gap: 8 (ref 5–15)
BUN: 21 mg/dL (ref 8–23)
CO2: 22 mmol/L (ref 22–32)
Calcium: 8.3 mg/dL — ABNORMAL LOW (ref 8.9–10.3)
Chloride: 109 mmol/L (ref 98–111)
Creatinine, Ser: 0.99 mg/dL (ref 0.44–1.00)
GFR, Estimated: >60 mL/min (ref >60)
Glucose, Bld: 137 mg/dL — ABNORMAL HIGH (ref 70–99)
Potassium: 3.7 mmol/L (ref 3.5–5.1)
Sodium: 139 mmol/L (ref 135–145)

## 2024-04-26 LAB — GLUCOSE, CAPILLARY
Glucose-Capillary: 131 mg/dL — ABNORMAL HIGH (ref 70–99)
Glucose-Capillary: 130 mg/dL — ABNORMAL HIGH (ref 70–99)

## 2024-04-26 MED ORDER — CELECOXIB 100 MG PO CAPS
100.0000 mg | ORAL_CAPSULE | Freq: Two times a day (BID) | ORAL | 2 refills | Status: AC
Start: 2024-04-26 — End: 2025-04-26

## 2024-04-26 MED ORDER — DOCUSATE SODIUM 100 MG PO CAPS: 100.0000 mg | ORAL_CAPSULE | Freq: Two times a day (BID) | ORAL | 0 refills | Status: AC

## 2024-04-26 MED ORDER — ASPIRIN EC 81 MG PO TBEC: 81.0000 mg | DELAYED_RELEASE_TABLET | Freq: Two times a day (BID) | ORAL | 11 refills | Status: AC

## 2024-04-26 MED ORDER — TIZANIDINE HCL 2 MG PO TABS: 2.0000 mg | ORAL_TABLET | Freq: Three times a day (TID) | ORAL | 2 refills | Status: AC

## 2024-04-26 MED ORDER — POLYETHYLENE GLYCOL 3350 17 G PO PACK: 17.0000 g | PACK | Freq: Every day | ORAL | 0 refills | Status: AC | PRN

## 2024-04-26 MED ORDER — HYDROCODONE-ACETAMINOPHEN 10-325 MG PO TABS: 1.0000 | ORAL_TABLET | ORAL | 0 refills | Status: AC | PRN

## 2024-04-26 NOTE — TOC Initial Note (Signed)
 Transition of Care [facility]) - Initial/Assessment Note    Patient Details  Name: Stephanie [name] MRN: [id number] Date of Birth: 1951/05/06  Transition of [facility]) CM/SW Contact:    Noreen KATHEE Pinal, LCSWA Phone Number: 04/26/2024, 10:02 AM  Clinical Narrative:                   CSW spoke with Amy Littrell via secure chat at Dr Margrette office about [facility] PT being setup. Amy shared that patient lives in [place] and her OP PT starts in 2 days, which she is aware due to making the appointments. ICM signing off.    Expected Discharge Plan: OP Rehab Barriers to Discharge: Continued Medical Work up   Patient Goals and CMS Choice Patient states their goals for this hospitalization and ongoing recovery are:: return back home CMS Medicare.gov Compare Post Acute Care list provided to:: Patient Choice offered to / list presented to : Patient      Expected Discharge Plan and Services In-house Referral: Clinical Social Work   Post Acute Care Choice: Durable Medical Equipment Living arrangements for the past 2 months: Single Family Home                           HH Arranged:  (OP PT - due to area where she lives)          Prior Living Arrangements/Services Living arrangements for the past 2 months: Single Family Home Lives with:: Spouse Patient language and need for interpreter reviewed:: Yes Do you feel safe going back to the place where you live?: Yes      Need for Family Participation in Patient Care: Yes (Comment) Care giver support system in place?: Yes (comment)   Criminal Activity/Legal Involvement Pertinent to Current Situation/Hospitalization: No - Comment as needed  Activities of Daily Living   ADL Screening (condition at time of admission) Independently performs ADLs?: Yes (appropriate for developmental age) Is the patient deaf or have difficulty hearing?: No Does the patient have difficulty seeing, even when wearing glasses/contacts?: No Does the patient  have difficulty concentrating, remembering, or making decisions?: No  Permission Sought/Granted      Share Information with NAME: Andreia     Permission granted to share info w Relationship: Patient     Emotional Assessment Appearance:: Appears stated age     Orientation: : Oriented to Self, Oriented to Place, Oriented to  Time, Oriented to Situation Alcohol / Substance Use: Not Applicable Psych Involvement: No (comment)  Admission diagnosis:  Osteoarthritis of right knee [M17.11] Patient Active Problem List   Diagnosis Date Noted   Osteoarthritis of right knee 04/25/2024   Hypertension, essential 12/16/2022   OSA (obstructive sleep apnea) 03/13/2022   S/P total knee replacement, left 03/04/16    Arthritis of knee, degenerative 02/23/2013   ACHILLES TENDINITIS 06/04/2009   ANKLE PAIN, LEFT 12/01/2007   PCP:  Sheryle Carwin, MD Pharmacy:   [facility] [address] [place] - [address] [place] [address] Phone: [phone] Fax: [phone]     Social Drivers of Health (SDOH) Social History: SDOH Screenings   Food Insecurity: No Food Insecurity (04/26/2024)  Housing: Low Risk  (04/26/2024)  Transportation Needs: No Transportation Needs (04/26/2024)  Utilities: Not At Risk (04/26/2024)  Social Connections: Patient Declined (04/26/2024)  Tobacco Use: Low Risk  (04/25/2024)   SDOH Interventions:     Readmission Risk Interventions     No data  to display

## 2024-04-26 NOTE — Plan of Care (Signed)
  Problem: Education: Goal: Knowledge of General Education information will improve Description: Including pain rating scale, medication(s)/side effects and non-pharmacologic comfort measures Outcome: Progressing   Problem: Clinical Measurements: Goal: Ability to maintain clinical measurements within normal limits will improve Outcome: Progressing Goal: Will remain free from infection Outcome: Progressing   Problem: Activity: Goal: Risk for activity intolerance will decrease Outcome: Progressing   Problem: Nutrition: Goal: Adequate nutrition will be maintained Outcome: Progressing   Problem: Safety: Goal: Ability to remain free from injury will improve Outcome: Progressing   Problem: Skin Integrity: Goal: Risk for impaired skin integrity will decrease Outcome: Progressing

## 2024-04-26 NOTE — Care Management Obs Status (Signed)
 MEDICARE OBSERVATION STATUS NOTIFICATION   Patient Details  Name: Stephanie [name] MRN: [id number] Date of Birth: [birth year]   Medicare Observation Status Notification Given:  Yes    Voncille Simm L Sahib Pella 04/26/2024, 12:36 PM

## 2024-04-26 NOTE — Progress Notes (Signed)
 Physical Therapy Treatment Patient Details Name: Stephanie [name] MRN: [id number] DOB: 09-17-1950 Today's Date: 04/26/2024  RIGHT KNEE ROM: __94____ degrees AMBULATION DISTANCE: __150__ feet   History of Present Illness Stephanie [name], 73 y.o. female, presents for right knee pain s/p right total knee arthroplasty 04/25/2024    PT Comments  Patient pleasant and agreeable to PT session today. Patient demonstrates slow labored movement for sitting up at bedside but is able to self complete with HOB slightly elevated and increased time with approved ability of navigating surgical limb to get EOB. Patient demonstrates ability to complete STS with increased effort and multiple attempts secondary to Rt knee pain and weakness. Patient tolerance much improved for short distance ambulation using RW. Patient demonstrates fair return for stair navigation using step pattern and standby assistance, especially on descent where balance deficits increased.  Patient will benefit from continued skilled physical therapy in hospital and recommended venue below to increase strength, balance, endurance for safe ADLs and gait.      If plan is discharge home, recommend the following: A little help with walking and/or transfers;Help with stairs or ramp for entrance;Assist for transportation   Can travel by private vehicle        Equipment Recommendations  None recommended by PT    Recommendations for Other Services       Precautions / Restrictions Precautions Precautions: Fall Recall of Precautions/Restrictions: Intact Restrictions Weight Bearing Restrictions Per Provider Order: Yes RLE Weight Bearing Per Provider Order: Weight bearing as tolerated     Mobility  Bed Mobility Overal bed mobility: Needs Assistance Bed Mobility: Supine to Sit     Supine to sit: Modified independent (Device/Increase time), HOB elevated     General bed mobility comments: labored movement, increased time, HOB  slightly elevated    Transfers Overall transfer level: Needs assistance Equipment used: Rolling walker (2 wheels) Transfers: Sit to/from Stand Sit to Stand: Contact guard assist           General transfer comment: Pt demonstrates improved ability to initiate and complete STS transfer, attempts x2 but able to complete without physical assistance    Ambulation/Gait Ambulation/Gait assistance: Supervision Gait Distance (Feet): 150 Feet Assistive device: Rolling walker (2 wheels) Gait Pattern/deviations: Decreased step length - right, Decreased step length - left, Decreased stride length, Decreased weight shift to right Gait velocity: Slow     General Gait Details: Patient tolerance much improved for short distance ambulation, does not require rest break; increased WB on RLE   Stairs Stairs: Yes Stairs assistance: Contact guard assist Stair Management: One rail Left Number of Stairs: 4 General stair comments: Patient demonstrates fair return for stair naviagtion with no observable loss of balance with assistance; mild unsteadiness with descent   Wheelchair Mobility     Tilt Bed    Modified Rankin (Stroke Patients Only)       Balance Overall balance assessment: Needs assistance Sitting-balance support: Feet supported, No upper extremity supported Sitting balance-Leahy Scale: Good Sitting balance - Comments: seated EOB   Standing balance support: During functional activity, Reliant on assistive device for balance, Bilateral upper extremity supported Standing balance-Leahy Scale: Fair Standing balance comment: fair using RW                            Communication Communication Communication: No apparent difficulties  Cognition Arousal: Alert Behavior During Therapy: WFL for tasks assessed/performed   PT - Cognitive impairments: No apparent impairments  Following commands: Intact      Cueing Cueing Techniques:  Verbal cues, Tactile cues  Exercises Total Joint Exercises Ankle Circles/Pumps: AROM, 10 reps, Supine, Right Quad Sets: Right, 10 reps, Supine, AROM, Strengthening Short Arc Quad: 10 reps, AROM, Strengthening, Right, Supine Heel Slides: 5 reps, AROM, Right, Supine Goniometric ROM: 94    General Comments        Pertinent Vitals/Pain Pain Assessment Pain Assessment: 0-10 Pain Score: 5  Pain Location: Rt knee Pain Descriptors / Indicators: Aching, Discomfort Pain Intervention(s): Monitored during session, Repositioned, Limited activity within patient's tolerance    Home Living                          Prior Function            PT Goals (current goals can now be found in the care plan section) Acute Rehab PT Goals Patient Stated Goal: returnh home PT Goal Formulation: With patient/family Time For Goal Achievement: 05/09/24 Potential to Achieve Goals: Good Progress towards PT goals: Progressing toward goals    Frequency    Min 5X/week      PT Plan      Co-evaluation              AM-PAC PT 6 Clicks Mobility   Outcome Measure  Help needed turning from your back to your side while in a flat bed without using bedrails?: A Little Help needed moving from lying on your back to sitting on the side of a flat bed without using bedrails?: A Little Help needed moving to and from a bed to a chair (including a wheelchair)?: A Little Help needed standing up from a chair using your arms (e.g., wheelchair or bedside chair)?: A Little Help needed to walk in hospital room?: A Little Help needed climbing 3-5 steps with a railing? : A Lot 6 Click Score: 17    End of Session Equipment Utilized During Treatment: Gait belt Activity Tolerance: Patient tolerated treatment well Patient left: in chair;with family/visitor present;with call bell/phone within reach Nurse Communication: Mobility status PT Visit Diagnosis: Unsteadiness on feet (R26.81);Other abnormalities  of gait and mobility (R26.89);Muscle weakness (generalized) (M62.81)     Time: 0922-0950 PT Time Calculation (min) (ACUTE ONLY): 28 min  Charges:    $Gait Training: 8-22 mins $Therapeutic Exercise: 8-22 mins PT General Charges $$ ACUTE PT VISIT: 1 Visit                     10:42 AM, 04/26/24,  Onnie Como, SPT

## 2024-04-26 NOTE — Therapy (Signed)
 OUTPATIENT PHYSICAL THERAPY LOWER EXTREMITY EVALUATION   Patient Name: Stephanie [name] MRN: [id number] DOB:Aug 13, 1951, 73 y.o., female Today's Date: 04/27/2024  END OF SESSION:  PT End of Session - 04/27/24 0935     Visit Number 1    Number of Visits 12    Date for PT Re-Evaluation 06/09/24    Authorization Type [employer]    Authorization Time Period no auth required    PT Start Time 0935    PT Stop Time 1015    PT Time Calculation (min) 40 min    Activity Tolerance Patient tolerated treatment well    Behavior During Therapy [facility] for tasks assessed/performed          Past Medical History:  Diagnosis Date   Diabetes mellitus without complication (HCC)    GERD (gastroesophageal reflux disease)    Hypercholesteremia    Hypertension    Hypothyroidism    PONV (postoperative nausea and vomiting)    Sleep apnea    Past Surgical History:  Procedure Laterality Date   ABDOMINAL HYSTERECTOMY     BREAST BIOPSY Left 2018   HYALINIZED FIBROADENOMATOID NODULE WITH CALCIFICATIONS   COLONOSCOPY N/A 12/26/2012   Procedure: COLONOSCOPY;  Surgeon: Lamar CHRISTELLA Hollingshead, MD;  Location: [facility];  Service: Endoscopy;  Laterality: N/A;  9:15 AM   COLONOSCOPY WITH PROPOFOL  N/A 04/26/2023   Procedure: COLONOSCOPY WITH PROPOFOL ;  Surgeon: Hollingshead Lamar CHRISTELLA, MD;  Location: [facility];  Service: Endoscopy;  Laterality: N/A;  930am, asa 3   TOTAL KNEE ARTHROPLASTY Left 03/04/2016   Procedure: LEFT TOTAL KNEE ARTHROPLASTY;  Surgeon: Taft FORBES Minerva, MD;  Location: [facility] ORS;  Service: Orthopedics;  Laterality: Left;   TOTAL KNEE ARTHROPLASTY Right 04/25/2024   Procedure: ARTHROPLASTY, KNEE, TOTAL;  Surgeon: Minerva Taft FORBES, MD;  Location: [facility] OR[facility];  Service: Orthopedics;  Laterality: Right;   TUBAL LIGATION     Patient Active Problem List   Diagnosis Date Noted   Osteoarthritis of right knee 04/25/2024   Hypertension, essential 12/16/2022   OSA (obstructive sleep apnea) 03/13/2022    S/P total knee replacement, left 03/04/16    Arthritis of knee, degenerative 02/23/2013   ACHILLES TENDINITIS 06/04/2009   ANKLE PAIN, LEFT 12/01/2007    PCP: Sheryle Carwin, MD  REFERRING PROVIDER: Minerva Taft, MD  REFERRING DIAG: s/p right TKA  THERAPY DIAG:  Acute pain of right knee  Stiffness of right knee, not elsewhere classified  Difficulty in walking, not elsewhere classified  Rationale for Evaluation and Treatment: Rehabilitation  ONSET DATE: 04/25/24  SUBJECTIVE:   SUBJECTIVE STATEMENT: Right knee worn out; s/p right knee TKA 04/25/24 per harrison; discharged home yesterday; has a CPM at home  PERTINENT HISTORY: Left TKA 2017 per Minerva PAIN:  Are you having pain? Yes: NPRS scale: 5/10 Pain location: right knee Pain description: tight Aggravating factors: standing, walking, bending Relieving factors: pain meds; ice  PRECAUTIONS: Fall  RED FLAGS: None   WEIGHT BEARING RESTRICTIONS: No  FALLS:  Has patient fallen in last 6 months? No  LIVING ENVIRONMENT: Lives with: lives with their spouse Lives in: House/apartment Stairs: Yes: Internal: 11 steps; on right going upwasher and dryer downstairs Has following equipment at home: Single point cane, Walker - 2 wheeled, and bed side commode  OCCUPATION: retired  PLOF: Independent  PATIENT GOALS: to walk and bend better  NEXT MD VISIT: PRN  OBJECTIVE:  Note: Objective measures were completed at Evaluation unless otherwise noted.  DIAGNOSTIC FINDINGS:   PATIENT SURVEYS:  LEFS  Extreme difficulty/unable (0), Quite a bit of difficulty (1), Moderate difficulty (2), Little difficulty (3), No difficulty (4) Survey date:    Any of your usual work, housework or school activities   2. Usual hobbies, recreational or sporting activities   3. Getting into/out of the bath   4. Walking between rooms   5. Putting on socks/shoes   6. Squatting    7. Lifting an object, like a bag of groceries from the  floor   8. Performing light activities around your home   9. Performing heavy activities around your home   10. Getting into/out of a car   11. Walking 2 blocks   12. Walking 1 mile   13. Going up/down 10 stairs (1 flight)   14. Standing for 1 hour   15.  sitting for 1 hour   16. Running on even ground   17. Running on uneven ground   18. Making sharp turns while running fast   19. Hopping    20. Rolling over in bed   Score total:  14/80 17.5%     COGNITION: Overall cognitive status: Within functional limits for tasks assessed     SENSATION: WFL  EDEMA:  Normal for this time s/p   POSTURE: rounded shoulders, forward head, and flexed trunk   PALPATION: General soreness normal for this time s/p  LOWER EXTREMITY ROM:  Active ROM Right eval Left eval  Hip flexion    Hip extension    Hip abduction    Hip adduction    Hip internal rotation    Hip external rotation    Knee flexion 75 supine   Knee extension -4 0  Ankle dorsiflexion    Ankle plantarflexion    Ankle inversion    Ankle eversion     (Blank rows = not tested)  LOWER EXTREMITY MMT:  MMT Right eval Left eval  Hip flexion 3-   Hip extension    Hip abduction    Hip adduction    Hip internal rotation    Hip external rotation    Knee flexion    Knee extension 3-   Ankle dorsiflexion    Ankle plantarflexion    Ankle inversion    Ankle eversion     (Blank rows = not tested)  FUNCTIONAL TESTS:  5 times sit to stand: 31.71 sec using hands heavily on walker; right foot advanced 2 minute walk test: 145 ft with RW  GAIT: Distance walked: 145 ft Assistive device utilized: Walker - 2 wheeled Level of assistance: SBA Comments: antalgic gait; decreased gait speed                                                                                                                                TREATMENT DATE: 04/27/24 physical therapy evaluation and HEP instruction     PATIENT EDUCATION:  Education  details: Patient educated on exam findings, POC, scope of PT,  HEP, and what to expect next visit. Person educated: Patient Education method: Explanation, Demonstration, and Handouts Education comprehension: verbalized understanding, returned demonstration, verbal cues required, and tactile cues required  HOME EXERCISE PROGRAM: Access Code: [email or web address] URL: [email or web address] Date: 04/27/2024 Prepared by: [facility] - [facility]  Exercises - Seated Heel Toe Raises  - 1 x daily - 7 x weekly - 3 sets - 10 reps - Seated Heel Slide  - 1 x daily - 7 x weekly - 2 sets - 10 reps - Supine Ankle Pumps  - 1 x daily - 7 x weekly - 2 sets - 10 reps - Supine Quad Set  - 1 x daily - 7 x weekly - 2 sets - 10 reps - 5 sec hold - Supine Heel Slide  - 1 x daily - 7 x weekly - 2 sets - 10 reps - Supine Heel Slide with Strap  - 1 x daily - 7 x weekly - 2 sets - 10 reps  ASSESSMENT:  CLINICAL IMPRESSION: Patient is a 73 y.o. female who was seen today for physical therapy evaluation and treatment for M17.11 (ICD-10-CM) - Primary osteoarthritis of right knee; s/p right TKA 04/25/24. Patient demonstrates muscle weakness, reduced ROM, gait impairments and fascial restrictions which are likely contributing to symptoms of pain and are negatively impacting patient ability to perform ADLs and functional mobility tasks. Patient will benefit from skilled physical therapy services to address these deficits to reduce pain and improve level of function with ADLs and functional mobility tasks.   OBJECTIVE IMPAIRMENTS: Abnormal gait, decreased activity tolerance, difficulty walking, decreased ROM, decreased strength, increased fascial restrictions, impaired perceived functional ability, and pain.   ACTIVITY LIMITATIONS: carrying, lifting, bending, sitting, standing, squatting, sleeping, stairs, transfers, bed mobility, bathing, toileting, dressing, and locomotion level  PARTICIPATION LIMITATIONS: meal prep, cleaning,  laundry, driving, shopping, community activity, and yard work  [facility] POTENTIAL: Good  CLINICAL DECISION MAKING: Evolving/moderate complexity  EVALUATION COMPLEXITY: Moderate   GOALS: Goals reviewed with patient? No  SHORT TERM GOALS: Target date: 05/11/2024 patient will be independent with initial HEP  Baseline: Goal status: INITIAL  2.  Patient will report 50% improvement overall  Baseline:  Goal status: INITIAL  3.  Patient will increase right knee mobility to -2 to 90 to promote normal navigation of steps; step over step pattern  Baseline: see above Goal status: INITIAL    LONG TERM GOALS: Target date: 1024/2025  Patient will be independent in self management strategies to improve quality of life and functional outcomes.  Baseline:  Goal status: INITIAL  2.  Patient will report 70% improvement overall  Baseline:  Goal status: INITIAL  3.  Patient will improve LEFS score by 26 points to demonstrate improved perceived function  Baseline:  Goal status: INITIAL  4.  Patient will increase right knee mobility to -2 to 120 to promote normal navigation of steps; step over step pattern  Baseline: -4 to 75 Goal status: INITIAL  5.   Patient will increase right leg MMT's to 5/5 to allow navigation of steps without gait deviation or loss of balance  Baseline: see above Goal status: INITIAL  PLAN:  PT FREQUENCY: 2x/week  PT DURATION: 6 weeks  PLANNED INTERVENTIONS: 97164- PT Re-evaluation, 97110-Therapeutic exercises, 97530- Therapeutic activity, 97112- Neuromuscular re-education, 97535- Self Care, 02859- Manual therapy, [id number]- Gait training, [phone]- Orthotic Fit/training, [id number]- Canalith repositioning, [id number]- Aquatic Therapy, [id number]- Splinting, [id number]- Wound care (first 20 sq cm), 02401- Wound care (each additional 20 sq cm)Patient/Family education, Balance training,  Stair training, Taping, Dry Needling, Joint mobilization, Joint manipulation, Spinal manipulation,  Spinal mobilization, Scar mobilization, and DME instructions.   PLAN FOR NEXT SESSION: Review HEP and goals; right knee mobility and strength; gait training   10:40 AM, 04/27/24 Alaysiah Browder Small Aldyn Toon MPT [facility] [place] [phone] Ph:[phone]

## 2024-04-26 NOTE — Progress Notes (Signed)
 Discharge instructions reviewed with patient and husband. Both verbalized understanding of instructions, patient discharged home with husband in stable condition.

## 2024-04-27 ENCOUNTER — Other Ambulatory Visit: Payer: Self-pay

## 2024-04-27 ENCOUNTER — Ambulatory Visit (HOSPITAL_COMMUNITY): Attending: Orthopedic Surgery

## 2024-04-27 DIAGNOSIS — M25561 Pain in right knee: Secondary | ICD-10-CM | POA: Diagnosis not present

## 2024-04-27 DIAGNOSIS — M25661 Stiffness of right knee, not elsewhere classified: Secondary | ICD-10-CM | POA: Diagnosis not present

## 2024-04-27 DIAGNOSIS — R262 Difficulty in walking, not elsewhere classified: Secondary | ICD-10-CM | POA: Insufficient documentation

## 2024-04-27 DIAGNOSIS — M1711 Unilateral primary osteoarthritis, right knee: Secondary | ICD-10-CM | POA: Insufficient documentation

## 2024-04-27 DIAGNOSIS — Z96651 Presence of right artificial knee joint: Secondary | ICD-10-CM | POA: Diagnosis not present

## 2024-04-27 LAB — GLUCOSE, CAPILLARY: Glucose-Capillary: 100 mg/dL — ABNORMAL HIGH (ref 70–99)

## 2024-04-27 NOTE — Discharge Summary (Signed)
 Physician Discharge Summary  Patient ID: Stephanie [name] MRN: [id number] DOB/AGE: [birth year] 73 y.o.  Admit date: 04/25/2024 Discharge date: 04/27/2024  Admission Diagnoses: Right knee osteoarthritis  Discharge Diagnoses: Right knee osteoarthritis  Discharged Condition: Stable  Procedure: Right knee arthroplasty  Hospital Course:  Hospital day 1 uncomplicated total knee arthroplasty right knee -Participated in physical therapy was able to ambulate 150 FT with 94 range of motion  Hospital day 2 the patient continued to do well remained afebrile with stable vital signs -Advance in physical therapy tolerating the stairs with good pain control  Implant Name Type Inv. Item Serial No. Manufacturer Lot No. LRB No. Used Action  CEMENT HV SMART SET - [id number] Cement CEMENT HV SMART SET  [employer] [id number] Right 2 Implanted  PATELLA MEDIAL ATTUN KNEE - [id number] Knees PATELLA MEDIAL ATTUN KNEE  [employer]PU[employer] [id number] Right 1 Implanted  ATTUNE PSFEM [id number] NARCEM KNEE - [id number] Femur ATTUNE PSFEM RTSZ4 NARCEM KNEE  [employer] [id number] Right 1 Implanted  BASEPLATE TIB CMT FB PCKT SZ3 - [id number] Knees BASEPLATE TIB CMT FB PCKT SZ3  [employer] [id number] Right 1 Implanted  INSERT TIB ATTUNE FB SZ4X7 - [id number] Insert INSERT TIB ATTUNE FB SZ4X7  [employer]PU[employer]TH[employer] [id number] Right 1 Implanted    Lab reports:    Latest Ref Rng & Units 04/26/2024    4:27 AM 04/25/2024   12:03 PM 04/18/2024   10:17 AM  CBC  WBC 4.0 - 10.5 K/uL 10.0  9.7  5.2   Hemoglobin 12.0 - 15.0 g/dL 8.5  9.9  88.9   Hematocrit 36.0 - 46.0 % 26.5  31.8  34.3   Platelets 150 - 400 K/uL 230  236  280       Latest Ref Rng & Units 04/26/2024    4:27 AM 04/25/2024   12:03 PM 04/18/2024   10:17 AM  BMP  Glucose 70 - 99 mg/dL 862   847   BUN 8 - 23 mg/dL 21   21   Creatinine 9.55 - 1.00 mg/dL 9.00  9.06  8.60   Sodium 135 - 145 mmol/L 139   138   Potassium 3.5 - 5.1 mmol/L 3.7   4.0    Chloride 98 - 111 mmol/L 109   105   CO2 22 - 32 mmol/L 22   23   Calcium 8.9 - 10.3 mg/dL 8.3   9.6       Discharge Exam: BP 125/64 (BP Location: Left Arm)   Pulse 95   Temp 98.4 F (36.9 C) (Oral)   Resp 18   Ht 5' 2 (1.575 m)   Wt 97.4 kg   SpO2 93%   BMI 39.27 kg/m  Physical Exam Mental status awake alert and oriented x 3  Calf supple soft negative Homans  Dressing scant drainage  Dorsiflexion plantarflexion foot normal   Disposition: Discharge disposition: 01-Home or Self Care       Discharge Instructions     Call MD / Call 911   Complete by: As directed    If you experience chest pain or shortness of breath, CALL 911 and be transported to the hospital emergency room.  If you develope a fever above 101 F, pus (white drainage) or increased drainage or redness at the wound, or calf pain, call your surgeon's office.   Constipation Prevention   Complete by: As directed    Drink plenty of fluids.  Prune juice may be helpful.  You may use a  stool softener, such as Colace (over the counter) 100 mg twice a day.  Use MiraLax  (over the counter) for constipation as needed.   Diet - low sodium heart healthy   Complete by: As directed    Discharge instructions   Complete by: As directed    Dressing : do not change; if the middle turns black call the office for a new dressing    CPM: 6 hrs per day; start at 0-90 increase 10 per daub as needed   Exercises: as instructed by PT  Pain:  1. Use the ice pad as much as possible to help with pain and swelling, 30 min every 2 hrs  2. Tylenol  [phone] mg every 6 hrs  3. Celebrex  100 mg twice a day  4. Norco 10 mg every 4 hrs if the above medications are not working   Increase activity slowly as tolerated   Complete by: As directed    Post-operative opioid taper instructions:   Complete by: As directed    POST-OPERATIVE OPIOID TAPER INSTRUCTIONS: It is important to wean off of your opioid medication as soon as possible.  If you do not need pain medication after your surgery it is ok to stop day one. Opioids include: Codeine, Hydrocodone (Norco, Vicodin), Oxycodone (Percocet, oxycontin ) and hydromorphone  amongst others.  Long term and even short term use of opiods can cause: Increased pain response Dependence Constipation Depression Respiratory depression And more.  Withdrawal symptoms can include Flu like symptoms Nausea, vomiting And more Techniques to manage these symptoms Hydrate well Eat regular healthy meals Stay active Use relaxation techniques(deep breathing, meditating, yoga) Do Not substitute Alcohol to help with tapering If you have been on opioids for less than two weeks and do not have pain than it is ok to stop all together.  Plan to wean off of opioids This plan should start within one week post op of your joint replacement. Maintain the same interval or time between taking each dose and first decrease the dose.  Cut the total daily intake of opioids by one tablet each day Next start to increase the time between doses. The last dose that should be eliminated is the evening dose.         Allergies as of 04/26/2024   No Known Allergies      Medication List     TAKE these medications    aspirin  EC 81 MG tablet Take 1 tablet (81 mg total) by mouth 2 times daily at 12 noon and 4 pm. Swallow whole. What changed: when to take this   celecoxib  100 MG capsule Commonly known as: CeleBREX  Take 1 capsule (100 mg total) by mouth 2 (two) times daily.   docusate sodium  100 MG capsule Commonly known as: COLACE Take 1 capsule (100 mg total) by mouth 2 (two) times daily.   esomeprazole 20 MG capsule Commonly known as: NEXIUM Take 20 mg by mouth daily as needed (indigestion/heartburn.).   hydrochlorothiazide  25 MG tablet Commonly known as: HYDRODIURIL  Take 25 mg by mouth in the morning.   HYDROcodone -acetaminophen  10-325 MG tablet Commonly known as: NORCO Take 1 tablet by mouth  every 4 (four) hours as needed for up to 5 days.   latanoprost  0.005 % ophthalmic solution Commonly known as: XALATAN  Place 1 drop into both eyes at bedtime.   levothyroxine  88 MCG tablet Commonly known as: SYNTHROID  Take 88 mcg by mouth daily before breakfast.   losartan  100 MG tablet Commonly known as: COZAAR  Take 100 mg by mouth in  the morning.   metFORMIN  1000 MG tablet Commonly known as: GLUCOPHAGE  Take 500 mg by mouth in the morning and at bedtime.   pioglitazone  15 MG tablet Commonly known as: ACTOS  Take 15 mg by mouth in the morning.   polyethylene glycol 17 g packet Commonly known as: MIRALAX  / GLYCOLAX  Take 17 g by mouth daily as needed for mild constipation.   pravastatin  40 MG tablet Commonly known as: PRAVACHOL  Take 40 mg by mouth at bedtime.   tiZANidine  2 MG tablet Commonly known as: ZANAFLEX  Take 1 tablet (2 mg total) by mouth 3 (three) times daily.   VITAMIN B-12 PO Take 1 tablet by mouth in the morning.   VITAMIN D3 PO Take 2,000 Units by mouth in the morning.         Signed: [name] 04/27/2024, 8:40 AM

## 2024-05-01 ENCOUNTER — Ambulatory Visit (HOSPITAL_COMMUNITY)

## 2024-05-01 DIAGNOSIS — M25561 Pain in right knee: Secondary | ICD-10-CM

## 2024-05-01 DIAGNOSIS — R262 Difficulty in walking, not elsewhere classified: Secondary | ICD-10-CM | POA: Diagnosis not present

## 2024-05-01 DIAGNOSIS — M25661 Stiffness of right knee, not elsewhere classified: Secondary | ICD-10-CM | POA: Diagnosis not present

## 2024-05-01 DIAGNOSIS — M1711 Unilateral primary osteoarthritis, right knee: Secondary | ICD-10-CM | POA: Diagnosis not present

## 2024-05-01 LAB — BPAM RBC
Blood Product Expiration Date: 202510032359
Blood Product Expiration Date: 202510072359
Unit Type and Rh: 1700
Unit Type and Rh: 1700

## 2024-05-01 LAB — TYPE AND SCREEN
ABO/RH(D): B POS
Antibody Screen: NEGATIVE
Unit division: 0
Unit division: 0

## 2024-05-01 NOTE — Therapy (Signed)
 OUTPATIENT PHYSICAL THERAPY LOWER EXTREMITY TREATMENT   Patient Name: Stephanie [name] MRN: [id number] DOB:11/14/1950, 73 y.o., female Today's Date: 05/01/2024  END OF SESSION:  PT End of Session - 05/01/24 0937     Visit Number 2    Number of Visits 12    Date for PT Re-Evaluation 06/09/24    Authorization Type [employer]    Authorization Time Period no auth required    PT Start Time 0933    PT Stop Time 1013    PT Time Calculation (min) 40 min    Activity Tolerance Patient tolerated treatment well    Behavior During Therapy [facility] for tasks assessed/performed          Past Medical History:  Diagnosis Date   Diabetes mellitus without complication (HCC)    GERD (gastroesophageal reflux disease)    Hypercholesteremia    Hypertension    Hypothyroidism    PONV (postoperative nausea and vomiting)    Sleep apnea    Past Surgical History:  Procedure Laterality Date   ABDOMINAL HYSTERECTOMY     BREAST BIOPSY Left 2018   HYALINIZED FIBROADENOMATOID NODULE WITH CALCIFICATIONS   COLONOSCOPY N/A 12/26/2012   Procedure: COLONOSCOPY;  Surgeon: Lamar CHRISTELLA Hollingshead, MD;  [facility];  Service: Endoscopy;  Laterality: N/A;  9:15 AM   COLONOSCOPY WITH PROPOFOL  N/A 04/26/2023   Procedure: COLONOSCOPY WITH PROPOFOL ;  Surgeon: Hollingshead Lamar CHRISTELLA, MD;  Location: [facility];  Service: Endoscopy;  Laterality: N/A;  930am, asa 3   TOTAL KNEE ARTHROPLASTY Left 03/04/2016   Procedure: LEFT TOTAL KNEE ARTHROPLASTY;  Surgeon: Taft FORBES Minerva, MD;  Location: [facility];  Service: Orthopedics;  Laterality: Left;   TOTAL KNEE ARTHROPLASTY Right 04/25/2024   Procedure: ARTHROPLASTY, KNEE, TOTAL;  Surgeon: Minerva Taft FORBES, MD;  Location: [facility];  Service: Orthopedics;  Laterality: Right;   TUBAL LIGATION     Patient Active Problem List   Diagnosis Date Noted   Osteoarthritis of right knee 04/25/2024   Hypertension, essential 12/16/2022   OSA (obstructive sleep apnea) 03/13/2022    S/P total knee replacement, left 03/04/16    Arthritis of knee, degenerative 02/23/2013   ACHILLES TENDINITIS 06/04/2009   ANKLE PAIN, LEFT 12/01/2007    PCP: Sheryle Carwin, MD  REFERRING PROVIDER: Minerva Taft, MD  REFERRING DIAG: s/p right TKA  THERAPY DIAG:  Acute pain of right knee  Stiffness of right knee, not elsewhere classified  Difficulty in walking, not elsewhere classified  Rationale for Evaluation and Treatment: Rehabilitation  ONSET DATE: 04/25/24  SUBJECTIVE:   SUBJECTIVE STATEMENT: Just really stiff 8/10  Eval:Right knee worn out; s/p right knee TKA 04/25/24 per harrison; discharged home yesterday; has a CPM at home  PERTINENT HISTORY: Left TKA 2017 per Minerva PAIN:  Are you having pain? Yes: NPRS scale: 5/10 Pain location: right knee Pain description: tight Aggravating factors: standing, walking, bending Relieving factors: pain meds; ice  PRECAUTIONS: Fall  RED FLAGS: None   WEIGHT BEARING RESTRICTIONS: No  FALLS:  Has patient fallen in last 6 months? No  LIVING ENVIRONMENT: Lives with: lives with their spouse Lives in: House/apartment Stairs: Yes: Internal: 11 steps; on right going upwasher and dryer downstairs Has following equipment at home: Single point cane, Walker - 2 wheeled, and bed side commode  OCCUPATION: retired  PLOF: Independent  PATIENT GOALS: to walk and bend better  NEXT MD VISIT: PRN  OBJECTIVE:  Note: Objective measures were completed at Evaluation unless otherwise noted.  DIAGNOSTIC  FINDINGS:   PATIENT SURVEYS:  LEFS  Extreme difficulty/unable (0), Quite a bit of difficulty (1), Moderate difficulty (2), Little difficulty (3), No difficulty (4) Survey date:    Any of your usual work, housework or school activities   2. Usual hobbies, recreational or sporting activities   3. Getting into/out of the bath   4. Walking between rooms   5. Putting on socks/shoes   6. Squatting    7. Lifting an object, like a  bag of groceries from the floor   8. Performing light activities around your home   9. Performing heavy activities around your home   10. Getting into/out of a car   11. Walking 2 blocks   12. Walking 1 mile   13. Going up/down 10 stairs (1 flight)   14. Standing for 1 hour   15.  sitting for 1 hour   16. Running on even ground   17. Running on uneven ground   18. Making sharp turns while running fast   19. Hopping    20. Rolling over in bed   Score total:  14/80 17.5%     COGNITION: Overall cognitive status: Within functional limits for tasks assessed     SENSATION: WFL  EDEMA:  Normal for this time s/p   POSTURE: rounded shoulders, forward head, and flexed trunk   PALPATION: General soreness normal for this time s/p  LOWER EXTREMITY ROM:  Active ROM Right eval Left eval Right 05/01/24  Hip flexion     Hip extension     Hip abduction     Hip adduction     Hip internal rotation     Hip external rotation     Knee flexion 75 supine  86 AAROM  Knee extension -4 0   Ankle dorsiflexion     Ankle plantarflexion     Ankle inversion     Ankle eversion      (Blank rows = not tested)  LOWER EXTREMITY MMT:  MMT Right eval Left eval  Hip flexion 3-   Hip extension    Hip abduction    Hip adduction    Hip internal rotation    Hip external rotation    Knee flexion    Knee extension 3-   Ankle dorsiflexion    Ankle plantarflexion    Ankle inversion    Ankle eversion     (Blank rows = not tested)  FUNCTIONAL TESTS:  5 times sit to stand: 31.71 sec using hands heavily on walker; right foot advanced 2 minute walk test: 145 ft with RW  GAIT: Distance walked: 145 ft Assistive device utilized: Walker - 2 wheeled Level of assistance: SBA Comments: antalgic gait; decreased gait speed                                                                                                                                TREATMENT DATE:  05/01/24 Review HEP and  goals Supine  Quad set 5 hold x 10 Outline of drainage on bandage with sharpie Heel slides with strap x 10 AAROM right knee flexion 86 degrees Ankle pumps x 10 SAQ's 2 x 10 Sitting: Dangle for knee flexion x 10 Heel slides x 5, then x 5 with PT overpressure Heel/toe raises x 15 Sit to stand using arms to push up from mat x 5 Standing: Heel raises x 10 Toe raises x 10 Slant board 5 x 10 Right hip abduction 2 x 10  04/27/24 physical therapy evaluation and HEP instruction     PATIENT EDUCATION:  Education details: Patient educated on exam findings, POC, scope of PT, HEP, and what to expect next visit. Person educated: Patient Education method: Explanation, Demonstration, and Handouts Education comprehension: verbalized understanding, returned demonstration, verbal cues required, and tactile cues required  HOME EXERCISE PROGRAM: 05/01/24- Supine Knee Extension Strengthening  - 1 x daily - 7 x weekly - 2 sets - 10 reps Access Code: [email or web address] URL: [email or web address] Date: 04/27/2024 Prepared by: [facility] - [facility]  Exercises - Seated Heel Toe Raises  - 1 x daily - 7 x weekly - 3 sets - 10 reps - Seated Heel Slide  - 1 x daily - 7 x weekly - 2 sets - 10 reps - Supine Ankle Pumps  - 1 x daily - 7 x weekly - 2 sets - 10 reps - Supine Quad Set  - 1 x daily - 7 x weekly - 2 sets - 10 reps - 5 sec hold - Supine Heel Slide  - 1 x daily - 7 x weekly - 2 sets - 10 reps - Supine Heel Slide with Strap  - 1 x daily - 7 x weekly - 2 sets - 10 reps  ASSESSMENT:  CLINICAL IMPRESSION: Started treatment with HEP review and goal review; patient verbalizes agreement with set rehab goals.  Good progress with flexion today.  She does have drainage visible under the pad of her bandage so outline with a sharpie with instructions to watch for additional drainage.  Progressed her ther ex today without issue; normal reports of general soreness for this time s/p.  Added SAQ;s to her HEP.   Patient will benefit from continued skilled therapy services to address deficits and promote return to optimal function.       Eval: Patient is a 73 y.o. female who was seen today for physical therapy evaluation and treatment for M17.11 (ICD-10-CM) - Primary osteoarthritis of right knee; s/p right TKA 04/25/24. Patient demonstrates muscle weakness, reduced ROM, gait impairments and fascial restrictions which are likely contributing to symptoms of pain and are negatively impacting patient ability to perform ADLs and functional mobility tasks. Patient will benefit from skilled physical therapy services to address these deficits to reduce pain and improve level of function with ADLs and functional mobility tasks.   OBJECTIVE IMPAIRMENTS: Abnormal gait, decreased activity tolerance, difficulty walking, decreased ROM, decreased strength, increased fascial restrictions, impaired perceived functional ability, and pain.   ACTIVITY LIMITATIONS: carrying, lifting, bending, sitting, standing, squatting, sleeping, stairs, transfers, bed mobility, bathing, toileting, dressing, and locomotion level  PARTICIPATION LIMITATIONS: meal prep, cleaning, laundry, driving, shopping, community activity, and yard work  [facility] POTENTIAL: Good  CLINICAL DECISION MAKING: Evolving/moderate complexity  EVALUATION COMPLEXITY: Moderate   GOALS: Goals reviewed with patient? No  SHORT TERM GOALS: Target date: 05/11/2024 patient will be independent with initial HEP  Baseline: Goal status: in progress  2.  Patient will report 50% improvement overall  Baseline:  Goal status: in progress  3.  Patient will increase right knee mobility to -2 to 90 to promote normal navigation of steps; step over step pattern  Baseline: see above Goal status: in progress     LONG TERM GOALS: Target date: 1024/2025  Patient will be independent in self management strategies to improve quality of life and functional  outcomes.  Baseline:  Goal status:in progress  2.  Patient will report 70% improvement overall  Baseline:  Goal status: in progress   3.  Patient will improve LEFS score by 26 points to demonstrate improved perceived function  Baseline:  Goal status: in progress  4.  Patient will increase right knee mobility to -2 to 120 to promote normal navigation of steps; step over step pattern  Baseline: -4 to 75 Goal status: in progress  5.   Patient will increase right leg MMT's to 5/5 to allow navigation of steps without gait deviation or loss of balance  Baseline: see above Goal status: in progress  PLAN:  PT FREQUENCY: 2x/week to 3 times a week  PT DURATION: 6 weeks  PLANNED INTERVENTIONS: 97164- PT Re-evaluation, 97110-Therapeutic exercises, 97530- Therapeutic activity, 97112- Neuromuscular re-education, 97535- Self Care, 02859- Manual therapy, [id number]- Gait training, [phone]- Orthotic Fit/training, [phone]- Canalith repositioning, [id number]- Aquatic Therapy, 97760- Splinting, 97597- Wound care (first 20 sq cm), 97598- Wound care (each additional 20 sq cm)Patient/Family education, Balance training, Stair training, Taping, Dry Needling, Joint mobilization, Joint manipulation, Spinal manipulation, Spinal mobilization, Scar mobilization, and DME instructions.   PLAN FOR NEXT SESSION:  right knee mobility and strength; gait training   10:10 AM, 05/01/24 Nakaila Freeze Small Laiza Veenstra MPT [place] [facility] [place] [phone]

## 2024-05-03 ENCOUNTER — Encounter (HOSPITAL_COMMUNITY): Payer: Self-pay

## 2024-05-03 ENCOUNTER — Ambulatory Visit (HOSPITAL_COMMUNITY)

## 2024-05-03 DIAGNOSIS — M25561 Pain in right knee: Secondary | ICD-10-CM

## 2024-05-03 DIAGNOSIS — R262 Difficulty in walking, not elsewhere classified: Secondary | ICD-10-CM

## 2024-05-03 DIAGNOSIS — M1711 Unilateral primary osteoarthritis, right knee: Secondary | ICD-10-CM | POA: Diagnosis not present

## 2024-05-03 DIAGNOSIS — M25661 Stiffness of right knee, not elsewhere classified: Secondary | ICD-10-CM

## 2024-05-03 NOTE — Therapy (Signed)
 OUTPATIENT PHYSICAL THERAPY LOWER EXTREMITY TREATMENT   Patient Name: Stephanie DANIELSEN MRN: [id number] DOB:[birth year], 73 y.o., female Today's Date: 05/03/2024  END OF SESSION:  PT End of Session - 05/03/24 0930     Visit Number 3    Number of Visits 12    Date for PT Re-Evaluation 06/09/24    Authorization Type [employer] Medicare    Authorization Time Period no auth required    PT Start Time 0931    PT Stop Time 1013    PT Time Calculation (min) 42 min    Activity Tolerance Patient tolerated treatment well    Behavior During Therapy WFL for tasks assessed/performed          Past Medical History:  Diagnosis Date   Diabetes mellitus without complication (HCC)    GERD (gastroesophageal reflux disease)    Hypercholesteremia    Hypertension    Hypothyroidism    PONV (postoperative nausea and vomiting)    Sleep apnea    Past Surgical History:  Procedure Laterality Date   ABDOMINAL HYSTERECTOMY     BREAST BIOPSY Left 2018   HYALINIZED FIBROADENOMATOID NODULE WITH CALCIFICATIONS   COLONOSCOPY N/A 12/26/2012   Procedure: COLONOSCOPY;  Surgeon: Lamar CHRISTELLA Hollingshead, MD;  [facility];  Service: Endoscopy;  Laterality: N/A;  9:15 AM   COLONOSCOPY WITH PROPOFOL  N/A 04/26/2023   Procedure: COLONOSCOPY WITH PROPOFOL ;  Surgeon: Hollingshead Lamar CHRISTELLA, MD;  [facility];  Service: Endoscopy;  Laterality: N/A;  930am, asa 3   TOTAL KNEE ARTHROPLASTY Left 03/04/2016   Procedure: LEFT TOTAL KNEE ARTHROPLASTY;  Surgeon: Taft FORBES Minerva, MD;  Location: [facility];  Service: Orthopedics;  Laterality: Left;   TOTAL KNEE ARTHROPLASTY Right 04/25/2024   Procedure: ARTHROPLASTY, KNEE, TOTAL;  Surgeon: Minerva Taft FORBES, MD;  Location: [facility];  Service: Orthopedics;  Laterality: Right;   TUBAL LIGATION     Patient Active Problem List   Diagnosis Date Noted   Osteoarthritis of right knee 04/25/2024   Hypertension, essential 12/16/2022   OSA (obstructive sleep apnea) 03/13/2022    S/P total knee replacement, left 03/04/16    Arthritis of knee, degenerative 02/23/2013   ACHILLES TENDINITIS 06/04/2009   ANKLE PAIN, LEFT 12/01/2007    PCP: Sheryle Carwin, MD  REFERRING PROVIDER: Minerva Taft, MD  REFERRING DIAG: s/p right TKA  THERAPY DIAG:  Acute pain of right knee  Stiffness of right knee, not elsewhere classified  Difficulty in walking, not elsewhere classified  Rationale for Evaluation and Treatment: Rehabilitation  ONSET DATE: 04/25/24  SUBJECTIVE:   SUBJECTIVE STATEMENT: Just really stiff 8/10  NO reports of pain, just a little stiffness this morning.  Reports her vision is a little blurry this morning that is  Eval:Right knee worn out; s/p right knee TKA 04/25/24 per harrison; discharged home yesterday; has a CPM at home  PERTINENT HISTORY: Left TKA 2017 per Minerva PAIN:  Are you having pain? Yes: NPRS scale: 5/10 Pain location: right knee Pain description: tight Aggravating factors: standing, walking, bending Relieving factors: pain meds; ice  PRECAUTIONS: Fall  RED FLAGS: None   WEIGHT BEARING RESTRICTIONS: No  FALLS:  Has patient fallen in last 6 months? No  LIVING ENVIRONMENT: Lives with: lives with their spouse Lives in: House/apartment Stairs: Yes: Internal: 11 steps; on right going upwasher and dryer downstairs Has following equipment at home: Single point cane, Walker - 2 wheeled, and bed side commode  OCCUPATION: retired  PLOF: Independent  PATIENT GOALS: to walk  and bend better  NEXT MD VISIT: PRN  OBJECTIVE:  Note: Objective measures were completed at Evaluation unless otherwise noted.  DIAGNOSTIC FINDINGS:   PATIENT SURVEYS:  LEFS  Extreme difficulty/unable (0), Quite a bit of difficulty (1), Moderate difficulty (2), Little difficulty (3), No difficulty (4) Survey date:    Any of your usual work, housework or school activities   2. Usual hobbies, recreational or sporting activities   3. Getting  into/out of the bath   4. Walking between rooms   5. Putting on socks/shoes   6. Squatting    7. Lifting an object, like a bag of groceries from the floor   8. Performing light activities around your home   9. Performing heavy activities around your home   10. Getting into/out of a car   11. Walking 2 blocks   12. Walking 1 mile   13. Going up/down 10 stairs (1 flight)   14. Standing for 1 hour   15.  sitting for 1 hour   16. Running on even ground   17. Running on uneven ground   18. Making sharp turns while running fast   19. Hopping    20. Rolling over in bed   Score total:  14/80 17.5%     COGNITION: Overall cognitive status: Within functional limits for tasks assessed     SENSATION: WFL  EDEMA:  Normal for this time s/p   POSTURE: rounded shoulders, forward head, and flexed trunk   PALPATION: General soreness normal for this time s/p  LOWER EXTREMITY ROM:  Active ROM Right eval Left eval Right 05/01/24  Hip flexion     Hip extension     Hip abduction     Hip adduction     Hip internal rotation     Hip external rotation     Knee flexion 75 supine  86 AAROM  Knee extension -4 0   Ankle dorsiflexion     Ankle plantarflexion     Ankle inversion     Ankle eversion      (Blank rows = not tested)  LOWER EXTREMITY MMT:  MMT Right eval Left eval  Hip flexion 3-   Hip extension    Hip abduction    Hip adduction    Hip internal rotation    Hip external rotation    Knee flexion    Knee extension 3-   Ankle dorsiflexion    Ankle plantarflexion    Ankle inversion    Ankle eversion     (Blank rows = not tested)  FUNCTIONAL TESTS:  5 times sit to stand: 31.71 sec using hands heavily on walker; right foot advanced 2 minute walk test: 145 ft with RW  GAIT: Distance walked: 145 ft Assistive device utilized: Walker - 2 wheeled Level of assistance: SBA Comments: antalgic gait; decreased gait speed  TREATMENT DATE:  05/03/24: BP: 99/51 mmHg, HR 85 bpm - Bike rocking seat 6, was able to make a couple full revolutions though compensation with hip raise. Standing by // bars: - Knee drive on 12in step 5x 10 for flexion. - Heel raise incline slope 15x - Toe raise decline slope 15x  - Slant board 3x 30 -Gait training for heel toe mechanics and toe push off for knee flexion during gait. x8ft Seated: - Heel slides 10x  - LAQ 10x - STS Cueing for equal weight bearing as increased WB-ing Lt compared to Rt Supine: - Quad sets - Heel slides with strap (Active movements then AAROM at end range) - AROM 0-94 degrees - Bridge 10x  05/01/24 Review HEP and goals Supine  Quad set 5 hold x 10 Outline of drainage on bandage with sharpie Heel slides with strap x 10 AAROM right knee flexion 86 degrees Ankle pumps x 10 SAQ's 2 x 10 Sitting: Dangle for knee flexion x 10 Heel slides x 5, then x 5 with PT overpressure Heel/toe raises x 15 Sit to stand using arms to push up from mat x 5 Standing: Heel raises x 10 Toe raises x 10 Slant board 5 x 10 Right hip abduction 2 x 10  04/27/24 physical therapy evaluation and HEP instruction     PATIENT EDUCATION:  Education details: Patient educated on exam findings, POC, scope of PT, HEP, and what to expect next visit. Person educated: Patient Education method: Explanation, Demonstration, and Handouts Education comprehension: verbalized understanding, returned demonstration, verbal cues required, and tactile cues required  HOME EXERCISE PROGRAM: 05/01/24- Supine Knee Extension Strengthening  - 1 x daily - 7 x weekly - 2 sets - 10 reps Access Code: [email or web address] URL: [email or web address] Date: 04/27/2024 Prepared by: [facility] - [facility]  Exercises - Seated Heel Toe Raises  - 1 x daily - 7 x weekly - 3 sets - 10 reps - Seated Heel Slide  - 1 x daily - 7 x weekly - 2  sets - 10 reps - Supine Ankle Pumps  - 1 x daily - 7 x weekly - 2 sets - 10 reps - Supine Quad Set  - 1 x daily - 7 x weekly - 2 sets - 10 reps - 5 sec hold - Supine Heel Slide  - 1 x daily - 7 x weekly - 2 sets - 10 reps - Supine Heel Slide with Strap  - 1 x daily - 7 x weekly - 2 sets - 10 reps  ASSESSMENT:  CLINICAL IMPRESSION: Reports of blurry vision this morning at entrance, BP taken 99/51 mmHg HR 85 bpm.  Reports of blurriness resolved at beginning of session and pt asymptomatic through session with no c/o dizziness or lightheadedness.  Session focus with knee mobility and gait training.  Began session on bike with cueing to reduce compensation with raised hip to improve knee mobility.  Added knee drives for flexion and bridges for gluteal strengthening.  Improved AROM 0-94 degrees. Pt tolerated well with no reports of increased pain, limited by appropriate fatigue levels with a couple rest breaks through session.  Therapist looked at bandages with no drainage outside of trace marked last sessions.  Eval: Patient is a 73 y.o. female who was seen today for physical therapy evaluation and treatment for M17.11 (ICD-10-CM) - Primary osteoarthritis of right knee; s/p right TKA 04/25/24. Patient demonstrates muscle weakness, reduced ROM, gait impairments and fascial restrictions which are likely contributing to symptoms of pain and are  negatively impacting patient ability to perform ADLs and functional mobility tasks. Patient will benefit from skilled physical therapy services to address these deficits to reduce pain and improve level of function with ADLs and functional mobility tasks.   OBJECTIVE IMPAIRMENTS: Abnormal gait, decreased activity tolerance, difficulty walking, decreased ROM, decreased strength, increased fascial restrictions, impaired perceived functional ability, and pain.   ACTIVITY LIMITATIONS: carrying, lifting, bending, sitting, standing, squatting, sleeping, stairs, transfers, bed  mobility, bathing, toileting, dressing, and locomotion level  PARTICIPATION LIMITATIONS: meal prep, cleaning, laundry, driving, shopping, community activity, and yard work  [facility] POTENTIAL: Good  CLINICAL DECISION MAKING: Evolving/moderate complexity  EVALUATION COMPLEXITY: Moderate   GOALS: Goals reviewed with patient? No  SHORT TERM GOALS: Target date: 05/11/2024 patient will be independent with initial HEP  Baseline: Goal status: in progress  2.  Patient will report 50% improvement overall  Baseline:  Goal status: in progress  3.  Patient will increase right knee mobility to -2 to 90 to promote normal navigation of steps; step over step pattern  Baseline: see above Goal status: in progress     LONG TERM GOALS: Target date: 1024/2025  Patient will be independent in self management strategies to improve quality of life and functional outcomes.  Baseline:  Goal status:in progress  2.  Patient will report 70% improvement overall  Baseline:  Goal status: in progress   3.  Patient will improve LEFS score by 26 points to demonstrate improved perceived function  Baseline:  Goal status: in progress  4.  Patient will increase right knee mobility to -2 to 120 to promote normal navigation of steps; step over step pattern  Baseline: -4 to 75 Goal status: in progress  5.   Patient will increase right leg MMT's to 5/5 to allow navigation of steps without gait deviation or loss of balance  Baseline: see above Goal status: in progress  PLAN:  PT FREQUENCY: 2x/week to 3 times a week  PT DURATION: 6 weeks  PLANNED INTERVENTIONS: 97164- PT Re-evaluation, 97110-Therapeutic exercises, 97530- Therapeutic activity, 97112- Neuromuscular re-education, 97535- Self Care, 02859- Manual therapy, [id number]- Gait training, [phone]- Orthotic Fit/training, [phone]- Canalith repositioning, [id number]- Aquatic Therapy, 97760- Splinting, 97597- Wound care (first 20 sq cm), 97598- Wound care (each  additional 20 sq cm)Patient/Family education, Balance training, Stair training, Taping, Dry Needling, Joint mobilization, Joint manipulation, Spinal manipulation, Spinal mobilization, Scar mobilization, and DME instructions.   PLAN FOR NEXT SESSION:  right knee mobility and strength; gait training  Augustin Mclean, LPTA/CLT; CBIS [phone]  11:22 AM, 05/03/24

## 2024-05-05 ENCOUNTER — Ambulatory Visit (HOSPITAL_COMMUNITY)

## 2024-05-05 DIAGNOSIS — M1711 Unilateral primary osteoarthritis, right knee: Secondary | ICD-10-CM | POA: Diagnosis not present

## 2024-05-05 DIAGNOSIS — M25561 Pain in right knee: Secondary | ICD-10-CM | POA: Diagnosis not present

## 2024-05-05 DIAGNOSIS — R262 Difficulty in walking, not elsewhere classified: Secondary | ICD-10-CM | POA: Diagnosis not present

## 2024-05-05 DIAGNOSIS — M25661 Stiffness of right knee, not elsewhere classified: Secondary | ICD-10-CM

## 2024-05-05 NOTE — Therapy (Signed)
 OUTPATIENT PHYSICAL THERAPY LOWER EXTREMITY TREATMENT   Patient Name: Stephanie [name] MRN: [id number] DOB:Aug 09, 1951, 73 y.o., female Today's Date: 05/05/2024  END OF SESSION:  PT End of Session - 05/05/24 0933     Visit Number 4    Number of Visits 12    Date for Recertification  06/09/24    Authorization Type [employer]    Authorization Time Period no auth required    PT Start Time 0933    PT Stop Time 1013    PT Time Calculation (min) 40 min    Activity Tolerance Patient tolerated treatment well    Behavior During Therapy [facility] for tasks assessed/performed          Past Medical History:  Diagnosis Date   Diabetes mellitus without complication (HCC)    GERD (gastroesophageal reflux disease)    Hypercholesteremia    Hypertension    Hypothyroidism    PONV (postoperative nausea and vomiting)    Sleep apnea    Past Surgical History:  Procedure Laterality Date   ABDOMINAL HYSTERECTOMY     BREAST BIOPSY Left 2018   HYALINIZED FIBROADENOMATOID NODULE WITH CALCIFICATIONS   COLONOSCOPY N/A 12/26/2012   Procedure: COLONOSCOPY;  Surgeon: Lamar CHRISTELLA Hollingshead, MD;  Location: [facility];  Service: Endoscopy;  Laterality: N/A;  9:15 AM   COLONOSCOPY WITH PROPOFOL  N/A 04/26/2023   Procedure: COLONOSCOPY WITH PROPOFOL ;  Surgeon: Hollingshead Lamar CHRISTELLA, MD;  Location: [facility];  Service: Endoscopy;  Laterality: N/A;  930am, asa 3   TOTAL KNEE ARTHROPLASTY Left 03/04/2016   Procedure: LEFT TOTAL KNEE ARTHROPLASTY;  Surgeon: Taft FORBES Minerva, MD;  Location: [facility] OR[facility];  Service: Orthopedics;  Laterality: Left;   TOTAL KNEE ARTHROPLASTY Right 04/25/2024   Procedure: ARTHROPLASTY, KNEE, TOTAL;  Surgeon: Minerva Taft FORBES, MD;  Location: [facility];  Service: Orthopedics;  Laterality: Right;   TUBAL LIGATION     Patient Active Problem List   Diagnosis Date Noted   Osteoarthritis of right knee 04/25/2024   Hypertension, essential 12/16/2022   OSA (obstructive sleep apnea) 03/13/2022    S/P total knee replacement, left 03/04/16    Arthritis of knee, degenerative 02/23/2013   ACHILLES TENDINITIS 06/04/2009   ANKLE PAIN, LEFT 12/01/2007    PCP: Sheryle Carwin, MD  REFERRING PROVIDER: Minerva Taft, MD  REFERRING DIAG: s/p right TKA  THERAPY DIAG:  Acute pain of right knee  Stiffness of right knee, not elsewhere classified  Difficulty in walking, not elsewhere classified  Rationale for Evaluation and Treatment: Rehabilitation  ONSET DATE: 04/25/24  SUBJECTIVE:   SUBJECTIVE STATEMENT: 7/10 pain this morning; so stiff; CPM machine stopped working so had to call and have it repaired  NO reports of pain, just a little stiffness this morning.  Reports her vision is a little blurry this morning that is  Eval:Right knee worn out; s/p right knee TKA 04/25/24 per harrison; discharged home yesterday; has a CPM at home  PERTINENT HISTORY: Left TKA 2017 per Minerva PAIN:  Are you having pain? Yes: NPRS scale: 5/10 Pain location: right knee Pain description: tight Aggravating factors: standing, walking, bending Relieving factors: pain meds; ice  PRECAUTIONS: Fall  RED FLAGS: None   WEIGHT BEARING RESTRICTIONS: No  FALLS:  Has patient fallen in last 6 months? No  LIVING ENVIRONMENT: Lives with: lives with their spouse Lives in: House/apartment Stairs: Yes: Internal: 11 steps; on right going upwasher and dryer downstairs Has following equipment at home: Single point cane, Walker - 2 wheeled, and  bed side commode  OCCUPATION: retired  PLOF: Independent  PATIENT GOALS: to walk and bend better  NEXT MD VISIT: PRN  OBJECTIVE:  Note: Objective measures were completed at Evaluation unless otherwise noted.  DIAGNOSTIC FINDINGS:   PATIENT SURVEYS:  LEFS  Extreme difficulty/unable (0), Quite a bit of difficulty (1), Moderate difficulty (2), Little difficulty (3), No difficulty (4) Survey date:    Any of your usual work, housework or school activities    2. Usual hobbies, recreational or sporting activities   3. Getting into/out of the bath   4. Walking between rooms   5. Putting on socks/shoes   6. Squatting    7. Lifting an object, like a bag of groceries from the floor   8. Performing light activities around your home   9. Performing heavy activities around your home   10. Getting into/out of a car   11. Walking 2 blocks   12. Walking 1 mile   13. Going up/down 10 stairs (1 flight)   14. Standing for 1 hour   15.  sitting for 1 hour   16. Running on even ground   17. Running on uneven ground   18. Making sharp turns while running fast   19. Hopping    20. Rolling over in bed   Score total:  14/80 17.5%     COGNITION: Overall cognitive status: Within functional limits for tasks assessed     SENSATION: WFL  EDEMA:  Normal for this time s/p   POSTURE: rounded shoulders, forward head, and flexed trunk   PALPATION: General soreness normal for this time s/p  LOWER EXTREMITY ROM:  Active ROM Right eval Left eval Right 05/01/24 Right 05/05/24  Hip flexion      Hip extension      Hip abduction      Hip adduction      Hip internal rotation      Hip external rotation      Knee flexion 75 supine  86 AAROM 101 AAROM  Knee extension -4 0    Ankle dorsiflexion      Ankle plantarflexion      Ankle inversion      Ankle eversion       (Blank rows = not tested)  LOWER EXTREMITY MMT:  MMT Right eval Left eval  Hip flexion 3-   Hip extension    Hip abduction    Hip adduction    Hip internal rotation    Hip external rotation    Knee flexion    Knee extension 3-   Ankle dorsiflexion    Ankle plantarflexion    Ankle inversion    Ankle eversion     (Blank rows = not tested)  FUNCTIONAL TESTS:  5 times sit to stand: 31.71 sec using hands heavily on walker; right foot advanced 2 minute walk test: 145 ft with RW  GAIT: Distance walked: 145 ft Assistive device utilized: Walker - 2 wheeled Level of assistance:  SBA Comments: antalgic gait; decreased gait speed  TREATMENT DATE:  05/05/24 Standing  Heel raises 2 x 10 8 box knee drives for right knee flexion x 2' Bike seat 8 x 5' half revolutions; able to make 2 full backwards revolutions Sitting on mat Dangle for knee flexion x 20 Supine: Quad set 5 hold x 10 Heel slides with strap x 10 AAROM right knee flexion 101 SAQ's 2# 2 x 10 SLR unable without assistance Sit to stand x 10 no UE assist from mat table Standing TKE with green theraband x 20     05/03/24: BP: 99/51 mmHg, HR 85 bpm - Bike rocking seat 6, was able to make a couple full revolutions though compensation with hip raise. Standing by // bars: - Knee drive on 12in step 5x 10 for flexion. - Heel raise incline slope 15x - Toe raise decline slope 15x  - Slant board 3x 30 -Gait training for heel toe mechanics and toe push off for knee flexion during gait. x81ft Seated: - Heel slides 10x  - LAQ 10x - STS Cueing for equal weight bearing as increased WB-ing Lt compared to Rt Supine: - Quad sets - Heel slides with strap (Active movements then AAROM at end range) - AROM 0-94 degrees - Bridge 10x  05/01/24 Review HEP and goals Supine  Quad set 5 hold x 10 Outline of drainage on bandage with sharpie Heel slides with strap x 10 AAROM right knee flexion 86 degrees Ankle pumps x 10 SAQ's 2 x 10 Sitting: Dangle for knee flexion x 10 Heel slides x 5, then x 5 with PT overpressure Heel/toe raises x 15 Sit to stand using arms to push up from mat x 5 Standing: Heel raises x 10 Toe raises x 10 Slant board 5 x 10 Right hip abduction 2 x 10  04/27/24 physical therapy evaluation and HEP instruction     PATIENT EDUCATION:  Education details: Patient educated on exam findings, POC, scope of PT, HEP, and what to expect next visit. Person educated:  Patient Education method: Explanation, Demonstration, and Handouts Education comprehension: verbalized understanding, returned demonstration, verbal cues required, and tactile cues required  HOME EXERCISE PROGRAM: 05/01/24- Supine Knee Extension Strengthening  - 1 x daily - 7 x weekly - 2 sets - 10 reps Access Code: [email or web address] URL: [email or web address] Date: 04/27/2024 Prepared by: [facility]  Exercises - Seated Heel Toe Raises  - 1 x daily - 7 x weekly - 3 sets - 10 reps - Seated Heel Slide  - 1 x daily - 7 x weekly - 2 sets - 10 reps - Supine Ankle Pumps  - 1 x daily - 7 x weekly - 2 sets - 10 reps - Supine Quad Set  - 1 x daily - 7 x weekly - 2 sets - 10 reps - 5 sec hold - Supine Heel Slide  - 1 x daily - 7 x weekly - 2 sets - 10 reps - Supine Heel Slide with Strap  - 1 x daily - 7 x weekly - 2 sets - 10 reps  ASSESSMENT:  CLINICAL IMPRESSION: Today's session with focus on right knee mobility and strength. Good progress noted today with right knee flexion.  Unable to perform SLR without assistance.   No reports today of light headedness during session.   Patient will benefit from continued skilled therapy services  to address deficits and promote return to optimal function.      Eval: Patient is a 73 y.o. female who was seen today for physical therapy  evaluation and treatment for M17.11 (ICD-10-CM) - Primary osteoarthritis of right knee; s/p right TKA 04/25/24. Patient demonstrates muscle weakness, reduced ROM, gait impairments and fascial restrictions which are likely contributing to symptoms of pain and are negatively impacting patient ability to perform ADLs and functional mobility tasks. Patient will benefit from skilled physical therapy services to address these deficits to reduce pain and improve level of function with ADLs and functional mobility tasks.   OBJECTIVE IMPAIRMENTS: Abnormal gait, decreased activity tolerance, difficulty walking, decreased ROM, decreased  strength, increased fascial restrictions, impaired perceived functional ability, and pain.   ACTIVITY LIMITATIONS: carrying, lifting, bending, sitting, standing, squatting, sleeping, stairs, transfers, bed mobility, bathing, toileting, dressing, and locomotion level  PARTICIPATION LIMITATIONS: meal prep, cleaning, laundry, driving, shopping, community activity, and yard work  [facility] POTENTIAL: Good  CLINICAL DECISION MAKING: Evolving/moderate complexity  EVALUATION COMPLEXITY: Moderate   GOALS: Goals reviewed with patient? No  SHORT TERM GOALS: Target date: 05/11/2024 patient will be independent with initial HEP  Baseline: Goal status: in progress  2.  Patient will report 50% improvement overall  Baseline:  Goal status: in progress  3.  Patient will increase right knee mobility to -2 to 90 to promote normal navigation of steps; step over step pattern  Baseline: see above Goal status: in progress     LONG TERM GOALS: Target date: 1024/2025  Patient will be independent in self management strategies to improve quality of life and functional outcomes.  Baseline:  Goal status:in progress  2.  Patient will report 70% improvement overall  Baseline:  Goal status: in progress   3.  Patient will improve LEFS score by 26 points to demonstrate improved perceived function  Baseline:  Goal status: in progress  4.  Patient will increase right knee mobility to -2 to 120 to promote normal navigation of steps; step over step pattern  Baseline: -4 to 75 Goal status: in progress  5.   Patient will increase right leg MMT's to 5/5 to allow navigation of steps without gait deviation or loss of balance  Baseline: see above Goal status: in progress  PLAN:  PT FREQUENCY: 2x/week to 3 times a week  PT DURATION: 6 weeks  PLANNED INTERVENTIONS: 97164- PT Re-evaluation, 97110-Therapeutic exercises, 97530- Therapeutic activity, 97112- Neuromuscular re-education, 97535- Self Care,  97140- Manual therapy, [id number]- Gait training, 02239- Orthotic Fit/training, 04007- Canalith repositioning, 02886- Aquatic Therapy, 97760- Splinting, 02402- Wound care (first 20 sq cm), 97598- Wound care (each additional 20 sq cm)Patient/Family education, Balance training, Stair training, Taping, Dry Needling, Joint mobilization, Joint manipulation, Spinal manipulation, Spinal mobilization, Scar mobilization, and DME instructions.   PLAN FOR NEXT SESSION:  right knee mobility and strength; gait training  10:15 AM, 05/05/24 Hurshel Bouillon Small Othella Slappey MPT [facility] [place] [phone]

## 2024-05-08 ENCOUNTER — Encounter: Payer: Self-pay | Admitting: Orthopedic Surgery

## 2024-05-08 ENCOUNTER — Ambulatory Visit (INDEPENDENT_AMBULATORY_CARE_PROVIDER_SITE_OTHER): Admitting: Orthopedic Surgery

## 2024-05-08 ENCOUNTER — Ambulatory Visit (HOSPITAL_COMMUNITY)

## 2024-05-08 DIAGNOSIS — Z96651 Presence of right artificial knee joint: Secondary | ICD-10-CM

## 2024-05-08 DIAGNOSIS — M1711 Unilateral primary osteoarthritis, right knee: Secondary | ICD-10-CM | POA: Diagnosis not present

## 2024-05-08 DIAGNOSIS — R262 Difficulty in walking, not elsewhere classified: Secondary | ICD-10-CM | POA: Diagnosis not present

## 2024-05-08 DIAGNOSIS — M25561 Pain in right knee: Secondary | ICD-10-CM | POA: Diagnosis not present

## 2024-05-08 DIAGNOSIS — M25661 Stiffness of right knee, not elsewhere classified: Secondary | ICD-10-CM

## 2024-05-08 NOTE — Progress Notes (Signed)
   There were no vitals taken for this visit.  There is no height or weight on file to calculate BMI.  Chief Complaint  Patient presents with   Post-op Follow-up    No diagnosis found.  What pharmacy do you use ? _______[facility]____________________  DOI/DOS/ Date: 04/25/24  Improved

## 2024-05-08 NOTE — Patient Instructions (Signed)
 Continue celebrex  , aspirin  and Tizanidine  as needed

## 2024-05-08 NOTE — Progress Notes (Signed)
 POST OP VISIT   Patient: Stephanie [name]           Date of Birth: 1951-02-18           MRN: [id number] Visit Date: 05/08/2024 Requested by: Sheryle Carwin, MD [address] [place],  [place] [address] PCP: Sheryle Carwin, MD   Encounter Diagnosis  Name Primary?   Primary osteoarthritis of right knee Yes   PROCEDURE: right tka   Chief Complaint  Patient presents with   Post-op Follow-up    No Known Allergies    Current Outpatient Medications:    aspirin  EC 81 MG tablet, Take 1 tablet (81 mg total) by mouth 2 times daily at 12 noon and 4 pm. Swallow whole., Disp: 60 tablet, Rfl: 11   celecoxib  (CELEBREX ) 100 MG capsule, Take 1 capsule (100 mg total) by mouth 2 (two) times daily., Disp: 60 capsule, Rfl: 2   Cholecalciferol  (VITAMIN D3 PO), Take 2,000 Units by mouth in the morning., Disp: , Rfl:    Cyanocobalamin  (VITAMIN B-12 PO), Take 1 tablet by mouth in the morning., Disp: , Rfl:    docusate sodium  (COLACE) 100 MG capsule, Take 1 capsule (100 mg total) by mouth 2 (two) times daily., Disp: 10 capsule, Rfl: 0   esomeprazole (NEXIUM) 20 MG capsule, Take 20 mg by mouth daily as needed (indigestion/heartburn.)., Disp: , Rfl:    hydrochlorothiazide  (HYDRODIURIL ) 25 MG tablet, Take 25 mg by mouth in the morning., Disp: , Rfl:    latanoprost  (XALATAN ) 0.005 % ophthalmic solution, Place 1 drop into both eyes at bedtime., Disp: , Rfl:    levothyroxine  (SYNTHROID ) 88 MCG tablet, Take 88 mcg by mouth daily before breakfast., Disp: , Rfl:    losartan  (COZAAR ) 100 MG tablet, Take 100 mg by mouth in the morning., Disp: , Rfl:    metFORMIN  (GLUCOPHAGE ) 1000 MG tablet, Take 500 mg by mouth in the morning and at bedtime., Disp: , Rfl:    pioglitazone  (ACTOS ) 15 MG tablet, Take 15 mg by mouth in the morning., Disp: , Rfl:    polyethylene glycol (MIRALAX  / GLYCOLAX ) 17 g packet, Take 17 g by mouth daily as needed for mild constipation., Disp: 14 each, Rfl: 0   pravastatin  (PRAVACHOL ) 40  MG tablet, Take 40 mg by mouth at bedtime., Disp: , Rfl:    tiZANidine  (ZANAFLEX ) 2 MG tablet, Take 1 tablet (2 mg total) by mouth 3 (three) times daily., Disp: 45 tablet, Rfl: 2  Current Facility-Administered Medications:    bupivacaine -meloxicam  ER (ZYNRELEF ) injection 400 mg, 400 mg, Infiltration, Once, Margrette Taft BRAVO, MD  PAIN MED: norco but not taking  DVTPREV asa bid   Stephanie [name] is doing well  Her wound looks good her staples were taken out  Her calf was soft her Toula' sign was negative there was no sign of DVT  Range of motion 0-90   IMAGING: Postop imaging shows mild varus at the tibial component and slight hyperextension at the knee   ASSESSMENT AND PLAN:  Postop visit #1  Doing well  Pain well-controlled no signs of DVT  Patient is performing outpatient physical therapy continue  I have reviewed the patient's history and given the presence of a fragility fracture, I have deemed the necessity of a osteoporosis management referral or confirmed that the patient is currently enrolled in a osteoporosis treatment program.  Per [facility] clinic policy, our goal is ensure optimal postoperative pain control with a multimodal pain management strategy. For all [facility] patients,  our goal is to wean post-operative narcotic medications by 6 weeks post-operatively. If this is not possible due to utilization of pain medication prior to surgery, your [facility] doctor will support your acute post-operative pain control for the first 6 weeks postoperatively, with a plan to transition you back to your primary pain team following that. Stephanie [name] will work to ensure a Therapist, occupational.

## 2024-05-08 NOTE — Therapy (Signed)
 OUTPATIENT PHYSICAL THERAPY LOWER EXTREMITY TREATMENT   Patient Name: Stephanie [name] MRN: [id number] DOB:Nov 22, 1950, 73 y.o., female Today's Date: 05/08/2024  END OF SESSION:  PT End of Session - 05/08/24 0859     Visit Number 5    Number of Visits 12    Date for Recertification  06/09/24    Authorization Type [employer]    Authorization Time Period no auth required    PT Start Time 0900    PT Stop Time 0940    PT Time Calculation (min) 40 min    Activity Tolerance Patient tolerated treatment well    Behavior During Therapy [facility] for tasks assessed/performed          Past Medical History:  Diagnosis Date   Diabetes mellitus without complication (HCC)    GERD (gastroesophageal reflux disease)    Hypercholesteremia    Hypertension    Hypothyroidism    PONV (postoperative nausea and vomiting)    Sleep apnea    Past Surgical History:  Procedure Laterality Date   ABDOMINAL HYSTERECTOMY     BREAST BIOPSY Left 2018   HYALINIZED FIBROADENOMATOID NODULE WITH CALCIFICATIONS   COLONOSCOPY N/A 12/26/2012   Procedure: COLONOSCOPY;  Surgeon: Lamar CHRISTELLA Hollingshead, MD;  Location: [facility];  Service: Endoscopy;  Laterality: N/A;  9:15 AM   COLONOSCOPY WITH PROPOFOL  N/A 04/26/2023   Procedure: COLONOSCOPY WITH PROPOFOL ;  Surgeon: Hollingshead Lamar CHRISTELLA, MD;  Location: [facility];  Service: Endoscopy;  Laterality: N/A;  930am, asa 3   TOTAL KNEE ARTHROPLASTY Left 03/04/2016   Procedure: LEFT TOTAL KNEE ARTHROPLASTY;  Surgeon: Taft FORBES Minerva, MD;  Location: [facility];  Service: Orthopedics;  Laterality: Left;   TOTAL KNEE ARTHROPLASTY Right 04/25/2024   Procedure: ARTHROPLASTY, KNEE, TOTAL;  Surgeon: Minerva Taft FORBES, MD;  Location: [facility];  Service: Orthopedics;  Laterality: Right;   TUBAL LIGATION     Patient Active Problem List   Diagnosis Date Noted   Osteoarthritis of right knee 04/25/2024   Hypertension, essential 12/16/2022   OSA (obstructive sleep apnea) 03/13/2022    S/P total knee replacement, left 03/04/16    Arthritis of knee, degenerative 02/23/2013   ACHILLES TENDINITIS 06/04/2009   ANKLE PAIN, LEFT 12/01/2007    PCP: Sheryle Carwin, MD  REFERRING PROVIDER: Minerva Taft, MD  REFERRING DIAG: s/p right TKA  THERAPY DIAG:  Acute pain of right knee  Stiffness of right knee, not elsewhere classified  Difficulty in walking, not elsewhere classified  Rationale for Evaluation and Treatment: Rehabilitation  ONSET DATE: 04/25/24  SUBJECTIVE:   SUBJECTIVE STATEMENT: Saw MD this morning; staples taken out; stung a little bit; put another bandage on.  Arrives with RW.  Feeling a little less stiff this morning; MD pleased with progress  NO reports of pain, just a little stiffness this morning.  Reports her vision is a little blurry this morning that is  Eval:Right knee worn out; s/p right knee TKA 04/25/24 per harrison; discharged home yesterday; has a CPM at home  PERTINENT HISTORY: Left TKA 2017 per Minerva PAIN:  Are you having pain? Yes: NPRS scale: 5/10 Pain location: right knee Pain description: tight Aggravating factors: standing, walking, bending Relieving factors: pain meds; ice  PRECAUTIONS: Fall  RED FLAGS: None   WEIGHT BEARING RESTRICTIONS: No  FALLS:  Has patient fallen in last 6 months? No  LIVING ENVIRONMENT: Lives with: lives with their spouse Lives in: House/apartment Stairs: Yes: Internal: 11 steps; on right going upwasher and dryer downstairs  Has following equipment at home: Single point cane, Walker - 2 wheeled, and bed side commode  OCCUPATION: retired  PLOF: Independent  PATIENT GOALS: to walk and bend better  NEXT MD VISIT: PRN  OBJECTIVE:  Note: Objective measures were completed at Evaluation unless otherwise noted.  DIAGNOSTIC FINDINGS:   PATIENT SURVEYS:  LEFS  Extreme difficulty/unable (0), Quite a bit of difficulty (1), Moderate difficulty (2), Little difficulty (3), No difficulty  (4) Survey date:    Any of your usual work, housework or school activities   2. Usual hobbies, recreational or sporting activities   3. Getting into/out of the bath   4. Walking between rooms   5. Putting on socks/shoes   6. Squatting    7. Lifting an object, like a bag of groceries from the floor   8. Performing light activities around your home   9. Performing heavy activities around your home   10. Getting into/out of a car   11. Walking 2 blocks   12. Walking 1 mile   13. Going up/down 10 stairs (1 flight)   14. Standing for 1 hour   15.  sitting for 1 hour   16. Running on even ground   17. Running on uneven ground   18. Making sharp turns while running fast   19. Hopping    20. Rolling over in bed   Score total:  14/80 17.5%     COGNITION: Overall cognitive status: Within functional limits for tasks assessed     SENSATION: WFL  EDEMA:  Normal for this time s/p   POSTURE: rounded shoulders, forward head, and flexed trunk   PALPATION: General soreness normal for this time s/p  LOWER EXTREMITY ROM:  Active ROM Right eval Left eval Right 05/01/24 Right 05/05/24  Hip flexion      Hip extension      Hip abduction      Hip adduction      Hip internal rotation      Hip external rotation      Knee flexion 75 supine  86 AAROM 101 AAROM  Knee extension -4 0    Ankle dorsiflexion      Ankle plantarflexion      Ankle inversion      Ankle eversion       (Blank rows = not tested)  LOWER EXTREMITY MMT:  MMT Right eval Left eval  Hip flexion 3-   Hip extension    Hip abduction    Hip adduction    Hip internal rotation    Hip external rotation    Knee flexion    Knee extension 3-   Ankle dorsiflexion    Ankle plantarflexion    Ankle inversion    Ankle eversion     (Blank rows = not tested)  FUNCTIONAL TESTS:  5 times sit to stand: 31.71 sec using hands heavily on walker; right foot advanced 2 minute walk test: 145 ft with RW  GAIT: Distance  walked: 145 ft Assistive device utilized: Walker - 2 wheeled Level of assistance: SBA Comments: antalgic gait; decreased gait speed  TREATMENT DATE:  05/08/24 Sitting  Knee flexion/heel slides x 1' Heel/toe raises x 15 Supine: Heel slides with strap x 10 AAROM right knee flexion 102 SAQ's 2# with 2 hold 2 x 10 SLR 2 x 5 Sit to stand from mat table 2 x 5 no UE assist mat at lowest height Standing: Heel raises 2 x 10 Toe raises 2 x 10 4 box step ups 2 x 10 12 box knee drives for right knee flexion x 2' Bike seat 8 x 5' rocking with occasional full revolution    05/05/24 Standing  Heel raises 2 x 10 8 box knee drives for right knee flexion x 2' Bike seat 8 x 5' half revolutions; able to make 2 full backwards revolutions Sitting on mat Dangle for knee flexion x 20 Supine: Quad set 5 hold x 10 Heel slides with strap x 10 AAROM right knee flexion 101 SAQ's 2# 2 x 10 SLR unable without assistance Sit to stand x 10 no UE assist from mat table Standing TKE with green theraband x 20     05/03/24: BP: 99/51 mmHg, HR 85 bpm - Bike rocking seat 6, was able to make a couple full revolutions though compensation with hip raise. Standing by // bars: - Knee drive on 12in step 5x 10 for flexion. - Heel raise incline slope 15x - Toe raise decline slope 15x  - Slant board 3x 30 -Gait training for heel toe mechanics and toe push off for knee flexion during gait. x62ft Seated: - Heel slides 10x  - LAQ 10x - STS Cueing for equal weight bearing as increased WB-ing Lt compared to Rt Supine: - Quad sets - Heel slides with strap (Active movements then AAROM at end range) - AROM 0-94 degrees - Bridge 10x  05/01/24 Review HEP and goals Supine  Quad set 5 hold x 10 Outline of drainage on bandage with sharpie Heel slides with strap x 10 AAROM right  knee flexion 86 degrees Ankle pumps x 10 SAQ's 2 x 10 Sitting: Dangle for knee flexion x 10 Heel slides x 5, then x 5 with PT overpressure Heel/toe raises x 15 Sit to stand using arms to push up from mat x 5 Standing: Heel raises x 10 Toe raises x 10 Slant board 5 x 10 Right hip abduction 2 x 10  04/27/24 physical therapy evaluation and HEP instruction     PATIENT EDUCATION:  Education details: Patient educated on exam findings, POC, scope of PT, HEP, and what to expect next visit. Person educated: Patient Education method: Explanation, Demonstration, and Handouts Education comprehension: verbalized understanding, returned demonstration, verbal cues required, and tactile cues required  HOME EXERCISE PROGRAM: 05/01/24- Supine Knee Extension Strengthening  - 1 x daily - 7 x weekly - 2 sets - 10 reps Access Code: [email or web address] URL: [email or web address] Date: 04/27/2024 Prepared by: [facility]  Exercises - Seated Heel Toe Raises  - 1 x daily - 7 x weekly - 3 sets - 10 reps - Seated Heel Slide  - 1 x daily - 7 x weekly - 2 sets - 10 reps - Supine Ankle Pumps  - 1 x daily - 7 x weekly - 2 sets - 10 reps - Supine Quad Set  - 1 x daily - 7 x weekly - 2 sets - 10 reps - 5 sec hold - Supine Heel Slide  - 1 x daily - 7 x weekly - 2 sets - 10 reps - Supine Heel Slide with Strap  -  1 x daily - 7 x weekly - 2 sets - 10 reps  ASSESSMENT:  CLINICAL IMPRESSION: Today's session with focus on right knee mobility and strength. New bandage in place after staples removed.  Continues with pain at end ranges but progressing well overall.  Patient able to perform SLR today with minimal lag.  Sit to stand from lowest height of mat table without UE assist.  Added step ups today for strengthening. Patient needs cues for step ups to step up and over versus using circumduction to step up.  Still tight with bike but able to make full revolutions intermittently.    Patient will benefit from  continued skilled therapy services  to address deficits and promote return to optimal function.      Eval: Patient is a 73 y.o. female who was seen today for physical therapy evaluation and treatment for M17.11 (ICD-10-CM) - Primary osteoarthritis of right knee; s/p right TKA 04/25/24. Patient demonstrates muscle weakness, reduced ROM, gait impairments and fascial restrictions which are likely contributing to symptoms of pain and are negatively impacting patient ability to perform ADLs and functional mobility tasks. Patient will benefit from skilled physical therapy services to address these deficits to reduce pain and improve level of function with ADLs and functional mobility tasks.   OBJECTIVE IMPAIRMENTS: Abnormal gait, decreased activity tolerance, difficulty walking, decreased ROM, decreased strength, increased fascial restrictions, impaired perceived functional ability, and pain.   ACTIVITY LIMITATIONS: carrying, lifting, bending, sitting, standing, squatting, sleeping, stairs, transfers, bed mobility, bathing, toileting, dressing, and locomotion level  PARTICIPATION LIMITATIONS: meal prep, cleaning, laundry, driving, shopping, community activity, and yard work  [facility] POTENTIAL: Good  CLINICAL DECISION MAKING: Evolving/moderate complexity  EVALUATION COMPLEXITY: Moderate   GOALS: Goals reviewed with patient? No  SHORT TERM GOALS: Target date: 05/11/2024 patient will be independent with initial HEP  Baseline: Goal status: in progress  2.  Patient will report 50% improvement overall  Baseline:  Goal status: in progress  3.  Patient will increase right knee mobility to -2 to 90 to promote normal navigation of steps; step over step pattern  Baseline: see above Goal status: in progress     LONG TERM GOALS: Target date: 1024/2025  Patient will be independent in self management strategies to improve quality of life and functional outcomes.  Baseline:  Goal status:in  progress  2.  Patient will report 70% improvement overall  Baseline:  Goal status: in progress   3.  Patient will improve LEFS score by 26 points to demonstrate improved perceived function  Baseline:  Goal status: in progress  4.  Patient will increase right knee mobility to -2 to 120 to promote normal navigation of steps; step over step pattern  Baseline: -4 to 75 Goal status: in progress  5.   Patient will increase right leg MMT's to 5/5 to allow navigation of steps without gait deviation or loss of balance  Baseline: see above Goal status: in progress  PLAN:  PT FREQUENCY: 2x/week to 3 times a week  PT DURATION: 6 weeks  PLANNED INTERVENTIONS: 97164- PT Re-evaluation, 97110-Therapeutic exercises, 97530- Therapeutic activity, 97112- Neuromuscular re-education, 97535- Self Care, [id number]- Manual therapy, [id number]- Gait training, [phone]- Orthotic Fit/training, [phone]- Canalith repositioning, [id number]- Aquatic Therapy, 97760- Splinting, 97597- Wound care (first 20 sq cm), 97598- Wound care (each additional 20 sq cm)Patient/Family education, Balance training, Stair training, Taping, Dry Needling, Joint mobilization, Joint manipulation, Spinal manipulation, Spinal mobilization, Scar mobilization, and DME instructions.   PLAN FOR NEXT SESSION:  right  knee mobility and strength; gait training  9:38 AM, 05/08/24 [place] Small Isebella Upshur MPT [facility] [place] [facility] [place] [phone]

## 2024-05-10 ENCOUNTER — Ambulatory Visit (HOSPITAL_COMMUNITY)

## 2024-05-10 DIAGNOSIS — M25561 Pain in right knee: Secondary | ICD-10-CM | POA: Diagnosis not present

## 2024-05-10 DIAGNOSIS — M25661 Stiffness of right knee, not elsewhere classified: Secondary | ICD-10-CM

## 2024-05-10 DIAGNOSIS — R262 Difficulty in walking, not elsewhere classified: Secondary | ICD-10-CM

## 2024-05-10 DIAGNOSIS — M1711 Unilateral primary osteoarthritis, right knee: Secondary | ICD-10-CM | POA: Diagnosis not present

## 2024-05-10 NOTE — Therapy (Signed)
 OUTPATIENT PHYSICAL THERAPY LOWER EXTREMITY TREATMENT   Patient Name: Stephanie [name] MRN: [id number] DOB:08/31/1950, 73 y.o., female Today's Date: 05/10/2024  END OF SESSION:  PT End of Session - 05/10/24 0902     Visit Number 6    Number of Visits 12    Date for Recertification  06/09/24    Authorization Type [employer]    Authorization Time Period no auth required    PT Start Time 0902    PT Stop Time 0942    PT Time Calculation (min) 40 min    Activity Tolerance Patient tolerated treatment well    Behavior During Therapy [facility] for tasks assessed/performed          Past Medical History:  Diagnosis Date   Diabetes mellitus without complication (HCC)    GERD (gastroesophageal reflux disease)    Hypercholesteremia    Hypertension    Hypothyroidism    PONV (postoperative nausea and vomiting)    Sleep apnea    Past Surgical History:  Procedure Laterality Date   ABDOMINAL HYSTERECTOMY     BREAST BIOPSY Left 2018   HYALINIZED FIBROADENOMATOID NODULE WITH CALCIFICATIONS   COLONOSCOPY N/A 12/26/2012   Procedure: COLONOSCOPY;  Surgeon: Lamar CHRISTELLA Hollingshead, MD;  Location: [facility];  Service: Endoscopy;  Laterality: N/A;  9:15 AM   COLONOSCOPY WITH PROPOFOL  N/A 04/26/2023   Procedure: COLONOSCOPY WITH PROPOFOL ;  Surgeon: Hollingshead Lamar CHRISTELLA, MD;  Location: [facility];  Service: Endoscopy;  Laterality: N/A;  930am, asa 3   TOTAL KNEE ARTHROPLASTY Left 03/04/2016   Procedure: LEFT TOTAL KNEE ARTHROPLASTY;  Surgeon: Taft FORBES Minerva, MD;  Location: [facility];  Service: Orthopedics;  Laterality: Left;   TOTAL KNEE ARTHROPLASTY Right 04/25/2024   Procedure: ARTHROPLASTY, KNEE, TOTAL;  Surgeon: Minerva Taft FORBES, MD;  Location: [facility];  Service: Orthopedics;  Laterality: Right;   TUBAL LIGATION     Patient Active Problem List   Diagnosis Date Noted   Osteoarthritis of right knee 04/25/2024   Hypertension, essential 12/16/2022   OSA (obstructive sleep apnea) 03/13/2022    S/P total knee replacement, left 03/04/16    Arthritis of knee, degenerative 02/23/2013   ACHILLES TENDINITIS 06/04/2009   ANKLE PAIN, LEFT 12/01/2007    PCP: Sheryle Carwin, MD  REFERRING PROVIDER: Minerva Taft, MD  REFERRING DIAG: s/p right TKA  THERAPY DIAG:  Acute pain of right knee  Stiffness of right knee, not elsewhere classified  Difficulty in walking, not elsewhere classified  Rationale for Evaluation and Treatment: Rehabilitation  ONSET DATE: 04/25/24  SUBJECTIVE:   SUBJECTIVE STATEMENT: Feeling stiff; able to vacuum a little yesterday at home.    NO reports of pain, just a little stiffness this morning.  Reports her vision is a little blurry this morning that is  Eval:Right knee worn out; s/p right knee TKA 04/25/24 per harrison; discharged home yesterday; has a CPM at home  PERTINENT HISTORY: Left TKA 2017 per Minerva PAIN:  Are you having pain? Yes: NPRS scale: 5/10 Pain location: right knee Pain description: tight Aggravating factors: standing, walking, bending Relieving factors: pain meds; ice  PRECAUTIONS: Fall  RED FLAGS: None   WEIGHT BEARING RESTRICTIONS: No  FALLS:  Has patient fallen in last 6 months? No  LIVING ENVIRONMENT: Lives with: lives with their spouse Lives in: House/apartment Stairs: Yes: Internal: 11 steps; on right going upwasher and dryer downstairs Has following equipment at home: Single point cane, Walker - 2 wheeled, and bed side commode  OCCUPATION: retired  PLOF: Independent  PATIENT GOALS: to walk and bend better  NEXT MD VISIT: PRN  OBJECTIVE:  Note: Objective measures were completed at Evaluation unless otherwise noted.  DIAGNOSTIC FINDINGS:   PATIENT SURVEYS:  LEFS  Extreme difficulty/unable (0), Quite a bit of difficulty (1), Moderate difficulty (2), Little difficulty (3), No difficulty (4) Survey date:    Any of your usual work, housework or school activities   2. Usual hobbies, recreational or  sporting activities   3. Getting into/out of the bath   4. Walking between rooms   5. Putting on socks/shoes   6. Squatting    7. Lifting an object, like a bag of groceries from the floor   8. Performing light activities around your home   9. Performing heavy activities around your home   10. Getting into/out of a car   11. Walking 2 blocks   12. Walking 1 mile   13. Going up/down 10 stairs (1 flight)   14. Standing for 1 hour   15.  sitting for 1 hour   16. Running on even ground   17. Running on uneven ground   18. Making sharp turns while running fast   19. Hopping    20. Rolling over in bed   Score total:  14/80 17.5%     COGNITION: Overall cognitive status: Within functional limits for tasks assessed     SENSATION: WFL  EDEMA:  Normal for this time s/p   POSTURE: rounded shoulders, forward head, and flexed trunk   PALPATION: General soreness normal for this time s/p  LOWER EXTREMITY ROM:  Active ROM Right eval Left eval Right 05/01/24 Right 05/05/24 Right 05/10/24  Hip flexion       Hip extension       Hip abduction       Hip adduction       Hip internal rotation       Hip external rotation       Knee flexion 75 supine  86 AAROM 101 AAROM 110 AAROM  Knee extension -4 0   0 AAROM  Ankle dorsiflexion       Ankle plantarflexion       Ankle inversion       Ankle eversion        (Blank rows = not tested)  LOWER EXTREMITY MMT:  MMT Right eval Left eval  Hip flexion 3-   Hip extension    Hip abduction    Hip adduction    Hip internal rotation    Hip external rotation    Knee flexion    Knee extension 3-   Ankle dorsiflexion    Ankle plantarflexion    Ankle inversion    Ankle eversion     (Blank rows = not tested)  FUNCTIONAL TESTS:  5 times sit to stand: 31.71 sec using hands heavily on walker; right foot advanced 2 minute walk test: 145 ft with RW  GAIT: Distance walked: 145 ft Assistive device utilized: Walker - 2 wheeled Level of  assistance: SBA Comments: antalgic gait; decreased gait speed  TREATMENT DATE:  05/10/24 Supine: Quad sets 5 hold x 10 Heel slides x 10 with strap then 5 with PT overpressure; right knee flexion 110 AAROM SAQ's 3# with 2 hold 2 x 10 Walking in // bars with left UE assist only down and back x 4 Standing: 12 step right knee drives for flexion x 2' Heel raises 2 x 10 6 box step ups 2 x 10 Bike seat 8 x 5' full forward revolutions after initial 30 sec    05/08/24 Sitting  Knee flexion/heel slides x 1' Heel/toe raises x 15 Supine: Heel slides with strap x 10 AAROM right knee flexion 102 SAQ's 2# with 2 hold 2 x 10 SLR 2 x 5 Sit to stand from mat table 2 x 5 no UE assist mat at lowest height Standing: Heel raises 2 x 10 Toe raises 2 x 10 4 box step ups 2 x 10 12 box knee drives for right knee flexion x 2' Bike seat 8 x 5' rocking with occasional full revolution    05/05/24 Standing  Heel raises 2 x 10 8 box knee drives for right knee flexion x 2' Bike seat 8 x 5' half revolutions; able to make 2 full backwards revolutions Sitting on mat Dangle for knee flexion x 20 Supine: Quad set 5 hold x 10 Heel slides with strap x 10 AAROM right knee flexion 101 SAQ's 2# 2 x 10 SLR unable without assistance Sit to stand x 10 no UE assist from mat table Standing TKE with green theraband x 20     05/03/24: BP: 99/51 mmHg, HR 85 bpm - Bike rocking seat 6, was able to make a couple full revolutions though compensation with hip raise. Standing by // bars: - Knee drive on 12in step 5x 10 for flexion. - Heel raise incline slope 15x - Toe raise decline slope 15x  - Slant board 3x 30 -Gait training for heel toe mechanics and toe push off for knee flexion during gait. x5ft Seated: - Heel slides 10x  - LAQ 10x - STS Cueing for equal weight  bearing as increased WB-ing Lt compared to Rt Supine: - Quad sets - Heel slides with strap (Active movements then AAROM at end range) - AROM 0-94 degrees - Bridge 10x  05/01/24 Review HEP and goals Supine  Quad set 5 hold x 10 Outline of drainage on bandage with sharpie Heel slides with strap x 10 AAROM right knee flexion 86 degrees Ankle pumps x 10 SAQ's 2 x 10 Sitting: Dangle for knee flexion x 10 Heel slides x 5, then x 5 with PT overpressure Heel/toe raises x 15 Sit to stand using arms to push up from mat x 5 Standing: Heel raises x 10 Toe raises x 10 Slant board 5 x 10 Right hip abduction 2 x 10  04/27/24 physical therapy evaluation and HEP instruction     PATIENT EDUCATION:  Education details: Patient educated on exam findings, POC, scope of PT, HEP, and what to expect next visit. Person educated: Patient Education method: Explanation, Demonstration, and Handouts Education comprehension: verbalized understanding, returned demonstration, verbal cues required, and tactile cues required  HOME EXERCISE PROGRAM: 05/01/24- Supine Knee Extension Strengthening  - 1 x daily - 7 x weekly - 2 sets - 10 reps Access Code: [email or web address] URL: [email or web address] Date: 04/27/2024 Prepared by: [facility]  Exercises - Seated Heel Toe Raises  - 1 x daily - 7 x weekly - 3 sets - 10 reps - Seated Heel Slide  -  1 x daily - 7 x weekly - 2 sets - 10 reps - Supine Ankle Pumps  - 1 x daily - 7 x weekly - 2 sets - 10 reps - Supine Quad Set  - 1 x daily - 7 x weekly - 2 sets - 10 reps - 5 sec hold - Supine Heel Slide  - 1 x daily - 7 x weekly - 2 sets - 10 reps - Supine Heel Slide with Strap  - 1 x daily - 7 x weekly - 2 sets - 10 reps  ASSESSMENT:  CLINICAL IMPRESSION: Today's session with continued focus on right knee mobility and strength. New bandage still in place.  Discussed with patient calling the [facility] to see about a tub transfer bench.  Good progress  with right knee mobility today; surpassed short term goal for mobility.  Increased step height for 6 with step ups; still needs occasional cues for form.    Patient will benefit from continued skilled therapy services  to address deficits and promote return to optimal function.      Eval: Patient is a 73 y.o. female who was seen today for physical therapy evaluation and treatment for M17.11 (ICD-10-CM) - Primary osteoarthritis of right knee; s/p right TKA 04/25/24. Patient demonstrates muscle weakness, reduced ROM, gait impairments and fascial restrictions which are likely contributing to symptoms of pain and are negatively impacting patient ability to perform ADLs and functional mobility tasks. Patient will benefit from skilled physical therapy services to address these deficits to reduce pain and improve level of function with ADLs and functional mobility tasks.   OBJECTIVE IMPAIRMENTS: Abnormal gait, decreased activity tolerance, difficulty walking, decreased ROM, decreased strength, increased fascial restrictions, impaired perceived functional ability, and pain.   ACTIVITY LIMITATIONS: carrying, lifting, bending, sitting, standing, squatting, sleeping, stairs, transfers, bed mobility, bathing, toileting, dressing, and locomotion level  PARTICIPATION LIMITATIONS: meal prep, cleaning, laundry, driving, shopping, community activity, and yard work  [facility] POTENTIAL: Good  CLINICAL DECISION MAKING: Evolving/moderate complexity  EVALUATION COMPLEXITY: Moderate   GOALS: Goals reviewed with patient? No  SHORT TERM GOALS: Target date: 05/11/2024 patient will be independent with initial HEP  Baseline: Goal status: met  2.  Patient will report 50% improvement overall  Baseline:  Goal status: in progress  3.  Patient will increase right knee mobility to -2 to 90 to promote normal navigation of steps; step over step pattern  Baseline: see above Goal status: met     LONG TERM GOALS: Target  date: 1024/2025  Patient will be independent in self management strategies to improve quality of life and functional outcomes.  Baseline:  Goal status:in progress  2.  Patient will report 70% improvement overall  Baseline:  Goal status: in progress   3.  Patient will improve LEFS score by 26 points to demonstrate improved perceived function  Baseline:  Goal status: in progress  4.  Patient will increase right knee mobility to -2 to 120 to promote normal navigation of steps; step over step pattern  Baseline: -4 to 75 Goal status: in progress  5.   Patient will increase right leg MMT's to 5/5 to allow navigation of steps without gait deviation or loss of balance  Baseline: see above Goal status: in progress  PLAN:  PT FREQUENCY: 2x/week to 3 times a week  PT DURATION: 6 weeks  PLANNED INTERVENTIONS: 97164- PT Re-evaluation, 97110-Therapeutic exercises, 97530- Therapeutic activity, 97112- Neuromuscular re-education, 97535- Self Care, 02859- Manual therapy, [id number]- Gait training, [phone]-  Orthotic Fit/training, 04007- Canalith repositioning, 02886- Aquatic Therapy, [id number]- Splinting, 02402- Wound care (first 20 sq cm), 97598- Wound care (each additional 20 sq cm)Patient/Family education, Balance training, Stair training, Taping, Dry Needling, Joint mobilization, Joint manipulation, Spinal manipulation, Spinal mobilization, Scar mobilization, and DME instructions.   PLAN FOR NEXT SESSION:  right knee mobility and strength; gait training  9:02 AM, 05/10/24 Deakon Frix Small Siedah Sedor MPT [facility] [place] [phone]

## 2024-05-12 ENCOUNTER — Ambulatory Visit (HOSPITAL_COMMUNITY)

## 2024-05-12 ENCOUNTER — Encounter (HOSPITAL_COMMUNITY): Payer: Self-pay

## 2024-05-12 DIAGNOSIS — M25661 Stiffness of right knee, not elsewhere classified: Secondary | ICD-10-CM

## 2024-05-12 DIAGNOSIS — M25561 Pain in right knee: Secondary | ICD-10-CM

## 2024-05-12 DIAGNOSIS — R262 Difficulty in walking, not elsewhere classified: Secondary | ICD-10-CM

## 2024-05-12 DIAGNOSIS — M1711 Unilateral primary osteoarthritis, right knee: Secondary | ICD-10-CM | POA: Diagnosis not present

## 2024-05-12 NOTE — Therapy (Signed)
 OUTPATIENT PHYSICAL THERAPY LOWER EXTREMITY TREATMENT   Patient Name: Stephanie [name] MRN: [id number] DOB:Oct 08, 1950, 73 y.o., female Today's Date: 05/12/2024  END OF SESSION:  PT End of Session - 05/12/24 0857     Visit Number 7    Number of Visits 12    Date for Recertification  06/09/24    Authorization Type [employer]    Authorization Time Period no auth required    PT Start Time 0859    PT Stop Time 0946    PT Time Calculation (min) 47 min    Activity Tolerance Patient tolerated treatment well    Behavior During Therapy [facility] for tasks assessed/performed          Past Medical History:  Diagnosis Date   Diabetes mellitus without complication (HCC)    GERD (gastroesophageal reflux disease)    Hypercholesteremia    Hypertension    Hypothyroidism    PONV (postoperative nausea and vomiting)    Sleep apnea    Past Surgical History:  Procedure Laterality Date   ABDOMINAL HYSTERECTOMY     BREAST BIOPSY Left 2018   HYALINIZED FIBROADENOMATOID NODULE WITH CALCIFICATIONS   COLONOSCOPY N/A 12/26/2012   Procedure: COLONOSCOPY;  Surgeon: Lamar CHRISTELLA Hollingshead, MD;  Location: [facility];  Service: Endoscopy;  Laterality: N/A;  9:15 AM   COLONOSCOPY WITH PROPOFOL  N/A 04/26/2023   Procedure: COLONOSCOPY WITH PROPOFOL ;  Surgeon: Hollingshead Lamar CHRISTELLA, MD;  Location: [facility];  Service: Endoscopy;  Laterality: N/A;  930am, asa 3   TOTAL KNEE ARTHROPLASTY Left 03/04/2016   Procedure: LEFT TOTAL KNEE ARTHROPLASTY;  Surgeon: Taft FORBES Minerva, MD;  Location: [facility];  Service: Orthopedics;  Laterality: Left;   TOTAL KNEE ARTHROPLASTY Right 04/25/2024   Procedure: ARTHROPLASTY, KNEE, TOTAL;  Surgeon: Minerva Taft FORBES, MD;  Location: [facility];  Service: Orthopedics;  Laterality: Right;   TUBAL LIGATION     Patient Active Problem List   Diagnosis Date Noted   Osteoarthritis of right knee 04/25/2024   Hypertension, essential 12/16/2022   OSA (obstructive sleep apnea) 03/13/2022    S/P total knee replacement, left 03/04/16    Arthritis of knee, degenerative 02/23/2013   ACHILLES TENDINITIS 06/04/2009   ANKLE PAIN, LEFT 12/01/2007    PCP: Sheryle Carwin, MD  REFERRING PROVIDER: Minerva Taft, MD Next apt: 05/29/24:  REFERRING DIAG: s/p right TKA  THERAPY DIAG:  Acute pain of right knee  Stiffness of right knee, not elsewhere classified  Difficulty in walking, not elsewhere classified  Rationale for Evaluation and Treatment: Rehabilitation  ONSET DATE: 04/25/24  SUBJECTIVE:   SUBJECTIVE STATEMENT: Pt arrived with SPC, stated knee was stiff today.     Eval:Right knee worn out; s/p right knee TKA 04/25/24 per harrison; discharged home yesterday; has a CPM at home  PERTINENT HISTORY: Left TKA 2017 per Minerva PAIN:  Are you having pain? Yes: NPRS scale: 4/10 Pain location: right knee Pain description: tight Aggravating factors: standing, walking, bending Relieving factors: pain meds; ice  PRECAUTIONS: Fall  RED FLAGS: None   WEIGHT BEARING RESTRICTIONS: No  FALLS:  Has patient fallen in last 6 months? No  LIVING ENVIRONMENT: Lives with: lives with their spouse Lives in: House/apartment Stairs: Yes: Internal: 11 steps; on right going upwasher and dryer downstairs Has following equipment at home: Single point cane, Walker - 2 wheeled, and bed side commode  OCCUPATION: retired  PLOF: Independent  PATIENT GOALS: to walk and bend better  NEXT MD VISIT: PRN  OBJECTIVE:  Note:  Objective measures were completed at Evaluation unless otherwise noted.  DIAGNOSTIC FINDINGS:   PATIENT SURVEYS:  LEFS  Extreme difficulty/unable (0), Quite a bit of difficulty (1), Moderate difficulty (2), Little difficulty (3), No difficulty (4) Survey date:    Any of your usual work, housework or school activities   2. Usual hobbies, recreational or sporting activities   3. Getting into/out of the bath   4. Walking between rooms   5. Putting on  socks/shoes   6. Squatting    7. Lifting an object, like a bag of groceries from the floor   8. Performing light activities around your home   9. Performing heavy activities around your home   10. Getting into/out of a car   11. Walking 2 blocks   12. Walking 1 mile   13. Going up/down 10 stairs (1 flight)   14. Standing for 1 hour   15.  sitting for 1 hour   16. Running on even ground   17. Running on uneven ground   18. Making sharp turns while running fast   19. Hopping    20. Rolling over in bed   Score total:  14/80 17.5%     COGNITION: Overall cognitive status: Within functional limits for tasks assessed     SENSATION: WFL  EDEMA:  Normal for this time s/p   POSTURE: rounded shoulders, forward head, and flexed trunk   PALPATION: General soreness normal for this time s/p  LOWER EXTREMITY ROM:  Active ROM Right eval Left eval Right 05/01/24 Right 05/05/24 Right 05/10/24  Hip flexion       Hip extension       Hip abduction       Hip adduction       Hip internal rotation       Hip external rotation       Knee flexion 75 supine  86 AAROM 101 AAROM 110 AAROM  Knee extension -4 0   0 AAROM  Ankle dorsiflexion       Ankle plantarflexion       Ankle inversion       Ankle eversion        (Blank rows = not tested)  LOWER EXTREMITY MMT:  MMT Right eval Left eval  Hip flexion 3-   Hip extension    Hip abduction    Hip adduction    Hip internal rotation    Hip external rotation    Knee flexion    Knee extension 3-   Ankle dorsiflexion    Ankle plantarflexion    Ankle inversion    Ankle eversion     (Blank rows = not tested)  FUNCTIONAL TESTS:  5 times sit to stand: 31.71 sec using hands heavily on walker; right foot advanced 2 minute walk test: 145 ft with RW  GAIT: Distance walked: 145 ft Assistive device utilized: Walker - 2 wheeled Level of assistance: SBA Comments: antalgic gait; decreased gait speed  TREATMENT DATE:  05/12/24: Gait training with SPC, cueing for proper hand use, good sequence  Bike seat 9 initial rocking then full revolution Standing by // bars: - Knee drive on 87pw step 5x 10 for flexion. - Heel raise incline slope 15x - Toe raise decline slope 15x  - STS 15x no HHA - Squat front of chair 10x - Lateral step up 4in with 2 UE support 10x cueing for mechanics - Forward step up 6in 15x 1 UE support - TKE 2Pl 10x 5 - Leg press 2Pl 2x 10 5 holds - Slant board 3x 30 - Hamstring stretch 3x 30 on 12in step Prone:  - quad stretch 3x 30 Supine:  - AROM 0-107 degrees  - AAOM 0-110 degrees  05/10/24 Supine: Quad sets 5 hold x 10 Heel slides x 10 with strap then 5 with PT overpressure; right knee flexion 110 AAROM SAQ's 3# with 2 hold 2 x 10 Walking in // bars with left UE assist only down and back x 4 Standing: 12 step right knee drives for flexion x 2' Heel raises 2 x 10 6 box step ups 2 x 10 Bike seat 8 x 5' full forward revolutions after initial 30 sec    05/08/24 Sitting  Knee flexion/heel slides x 1' Heel/toe raises x 15 Supine: Heel slides with strap x 10 AAROM right knee flexion 102 SAQ's 2# with 2 hold 2 x 10 SLR 2 x 5 Sit to stand from mat table 2 x 5 no UE assist mat at lowest height Standing: Heel raises 2 x 10 Toe raises 2 x 10 4 box step ups 2 x 10 12 box knee drives for right knee flexion x 2' Bike seat 8 x 5' rocking with occasional full revolution    05/05/24 Standing  Heel raises 2 x 10 8 box knee drives for right knee flexion x 2' Bike seat 8 x 5' half revolutions; able to make 2 full backwards revolutions Sitting on mat Dangle for knee flexion x 20 Supine: Quad set 5 hold x 10 Heel slides with strap x 10 AAROM right knee flexion 101 SAQ's 2# 2 x 10 SLR unable without assistance Sit to stand x 10 no UE assist from mat  table Standing TKE with green theraband x 20     05/03/24: BP: 99/51 mmHg, HR 85 bpm - Bike rocking seat 6, was able to make a couple full revolutions though compensation with hip raise. Standing by // bars: - Knee drive on 12in step 5x 10 for flexion. - Heel raise incline slope 15x - Toe raise decline slope 15x  - Slant board 3x 30 -Gait training for heel toe mechanics and toe push off for knee flexion during gait. x41ft Seated: - Heel slides 10x  - LAQ 10x - STS Cueing for equal weight bearing as increased WB-ing Lt compared to Rt Supine: - Quad sets - Heel slides with strap (Active movements then AAROM at end range) - AROM 0-94 degrees - Bridge 10x  05/01/24 Review HEP and goals Supine  Quad set 5 hold x 10 Outline of drainage on bandage with sharpie Heel slides with strap x 10 AAROM right knee flexion 86 degrees Ankle pumps x 10 SAQ's 2 x 10 Sitting: Dangle for knee flexion x 10 Heel slides x 5, then x 5 with PT overpressure Heel/toe raises x 15 Sit to stand using arms to push up from mat x 5 Standing: Heel raises x 10 Toe raises x 10 Slant board  5 x 10 Right hip abduction 2 x 10  04/27/24 physical therapy evaluation and HEP instruction     PATIENT EDUCATION:  Education details: Patient educated on exam findings, POC, scope of PT, HEP, and what to expect next visit. Person educated: Patient Education method: Explanation, Demonstration, and Handouts Education comprehension: verbalized understanding, returned demonstration, verbal cues required, and tactile cues required  HOME EXERCISE PROGRAM: 05/01/24- Supine Knee Extension Strengthening  - 1 x daily - 7 x weekly - 2 sets - 10 reps Access Code: [email or web address] URL: [email or web address] Date: 04/27/2024 Prepared by: [facility]  Exercises - Seated Heel Toe Raises  - 1 x daily - 7 x weekly - 3 sets - 10 reps - Seated Heel Slide  - 1 x daily - 7 x weekly - 2 sets - 10 reps - Supine Ankle Pumps   - 1 x daily - 7 x weekly - 2 sets - 10 reps - Supine Quad Set  - 1 x daily - 7 x weekly - 2 sets - 10 reps - 5 sec hold - Supine Heel Slide  - 1 x daily - 7 x weekly - 2 sets - 10 reps - Supine Heel Slide with Strap  - 1 x daily - 7 x weekly - 2 sets - 10 reps  ASSESSMENT:  CLINICAL IMPRESSION: Began session discussing proper hand use with SPC, presents with good sequence following cueing.  Session on knee mobility and functional strengthening.  Good progress with knee AROM 0-107 and AAROM to 110 degrees flexion.  Added quad stretch to improve flexion.  Progressed functional strengthening with additional lateral step up and leg press for posterior chain and continues with mobility exercises/stretches.  Pt tolerated well to session with no reports of increased pain.   Eval: Patient is a 73 y.o. female who was seen today for physical therapy evaluation and treatment for M17.11 (ICD-10-CM) - Primary osteoarthritis of right knee; s/p right TKA 04/25/24. Patient demonstrates muscle weakness, reduced ROM, gait impairments and fascial restrictions which are likely contributing to symptoms of pain and are negatively impacting patient ability to perform ADLs and functional mobility tasks. Patient will benefit from skilled physical therapy services to address these deficits to reduce pain and improve level of function with ADLs and functional mobility tasks.   OBJECTIVE IMPAIRMENTS: Abnormal gait, decreased activity tolerance, difficulty walking, decreased ROM, decreased strength, increased fascial restrictions, impaired perceived functional ability, and pain.   ACTIVITY LIMITATIONS: carrying, lifting, bending, sitting, standing, squatting, sleeping, stairs, transfers, bed mobility, bathing, toileting, dressing, and locomotion level  PARTICIPATION LIMITATIONS: meal prep, cleaning, laundry, driving, shopping, community activity, and yard work  [facility] POTENTIAL: Good  CLINICAL DECISION MAKING:  Evolving/moderate complexity  EVALUATION COMPLEXITY: Moderate   GOALS: Goals reviewed with patient? No  SHORT TERM GOALS: Target date: 05/11/2024 patient will be independent with initial HEP  Baseline: Goal status: met  2.  Patient will report 50% improvement overall  Baseline:  Goal status: in progress  3.  Patient will increase right knee mobility to -2 to 90 to promote normal navigation of steps; step over step pattern  Baseline: see above Goal status: met     LONG TERM GOALS: Target date: 1024/2025  Patient will be independent in self management strategies to improve quality of life and functional outcomes.  Baseline:  Goal status:in progress  2.  Patient will report 70% improvement overall  Baseline:  Goal status: in progress   3.  Patient will improve LEFS score by 26  points to demonstrate improved perceived function  Baseline:  Goal status: in progress  4.  Patient will increase right knee mobility to -2 to 120 to promote normal navigation of steps; step over step pattern  Baseline: -4 to 75 Goal status: in progress  5.   Patient will increase right leg MMT's to 5/5 to allow navigation of steps without gait deviation or loss of balance  Baseline: see above Goal status: in progress  PLAN:  PT FREQUENCY: 2x/week to 3 times a week  PT DURATION: 6 weeks  PLANNED INTERVENTIONS: 97164- PT Re-evaluation, 97110-Therapeutic exercises, 97530- Therapeutic activity, 97112- Neuromuscular re-education, 97535- Self Care, [id number]- Manual therapy, [id number]- Gait training, [phone]- Orthotic Fit/training, [phone]- Canalith repositioning, [id number]- Aquatic Therapy, 97760- Splinting, 97597- Wound care (first 20 sq cm), 97598- Wound care (each additional 20 sq cm)Patient/Family education, Balance training, Stair training, Taping, Dry Needling, Joint mobilization, Joint manipulation, Spinal manipulation, Spinal mobilization, Scar mobilization, and DME instructions.   PLAN FOR NEXT  SESSION:  right knee mobility and strength; gait training.  Trial contract/relax in prone to improve flexion.  Augustin Mclean, LPTA/CLT; CBIS [phone]  9:55 AM, 05/12/24

## 2024-05-15 ENCOUNTER — Ambulatory Visit (HOSPITAL_COMMUNITY)

## 2024-05-15 ENCOUNTER — Encounter (HOSPITAL_COMMUNITY): Payer: Self-pay

## 2024-05-15 DIAGNOSIS — M25661 Stiffness of right knee, not elsewhere classified: Secondary | ICD-10-CM | POA: Diagnosis not present

## 2024-05-15 DIAGNOSIS — M1711 Unilateral primary osteoarthritis, right knee: Secondary | ICD-10-CM | POA: Diagnosis not present

## 2024-05-15 DIAGNOSIS — M25561 Pain in right knee: Secondary | ICD-10-CM

## 2024-05-15 DIAGNOSIS — R262 Difficulty in walking, not elsewhere classified: Secondary | ICD-10-CM | POA: Diagnosis not present

## 2024-05-15 NOTE — Therapy (Signed)
 OUTPATIENT PHYSICAL THERAPY LOWER EXTREMITY TREATMENT   Patient Name: Stephanie [name] MRN: [id number] DOB:[birth year], 73 y.o., female Today's Date: 05/15/2024  END OF SESSION:  PT End of Session - 05/15/24 0945     Visit Number 8    Number of Visits 12    Date for Recertification  06/09/24    Authorization Type [employer]    Authorization Time Period no auth required    Progress Note Due on Visit 1    PT Start Time 0904    PT Stop Time 0943    PT Time Calculation (min) 39 min    Activity Tolerance Patient tolerated treatment well    Behavior During Therapy [facility] for tasks assessed/performed           Past Medical History:  Diagnosis Date   Diabetes mellitus without complication (HCC)    GERD (gastroesophageal reflux disease)    Hypercholesteremia    Hypertension    Hypothyroidism    PONV (postoperative nausea and vomiting)    Sleep apnea    Past Surgical History:  Procedure Laterality Date   ABDOMINAL HYSTERECTOMY     BREAST BIOPSY Left 2018   HYALINIZED FIBROADENOMATOID NODULE WITH CALCIFICATIONS   COLONOSCOPY N/A 12/26/2012   Procedure: COLONOSCOPY;  Surgeon: Lamar CHRISTELLA Hollingshead, MD;  Location: [facility];  Service: Endoscopy;  Laterality: N/A;  9:15 AM   COLONOSCOPY WITH PROPOFOL  N/A 04/26/2023   Procedure: COLONOSCOPY WITH PROPOFOL ;  Surgeon: Hollingshead Lamar CHRISTELLA, MD;  Location: [facility];  Service: Endoscopy;  Laterality: N/A;  930am, asa 3   TOTAL KNEE ARTHROPLASTY Left 03/04/2016   Procedure: LEFT TOTAL KNEE ARTHROPLASTY;  Surgeon: Taft FORBES Minerva, MD;  Location: [facility];  Service: Orthopedics;  Laterality: Left;   TOTAL KNEE ARTHROPLASTY Right 04/25/2024   Procedure: ARTHROPLASTY, KNEE, TOTAL;  Surgeon: Minerva Taft FORBES, MD;  Location: [facility];  Service: Orthopedics;  Laterality: Right;   TUBAL LIGATION     Patient Active Problem List   Diagnosis Date Noted   Osteoarthritis of right knee 04/25/2024   Hypertension, essential 12/16/2022   OSA  (obstructive sleep apnea) 03/13/2022   S/P total knee replacement, left 03/04/16    Arthritis of knee, degenerative 02/23/2013   ACHILLES TENDINITIS 06/04/2009   ANKLE PAIN, LEFT 12/01/2007    PCP: Sheryle Carwin, MD  REFERRING PROVIDER: Minerva Taft, MD Next apt: 05/29/24:  REFERRING DIAG: s/p right TKA  THERAPY DIAG:  Acute pain of right knee  Stiffness of right knee, not elsewhere classified  Difficulty in walking, not elsewhere classified  Rationale for Evaluation and Treatment: Rehabilitation  ONSET DATE: 04/25/24  SUBJECTIVE:   SUBJECTIVE STATEMENT: Pt reports moderate stiffness today, 5/10. Reports HEP going well. But still trying to pull it back is hard. Still using CPM machine.    Eval:Right knee worn out; s/p right knee TKA 04/25/24 per harrison; discharged home yesterday; has a CPM at home  PERTINENT HISTORY: Left TKA 2017 per Minerva PAIN:  Are you having pain? Yes: NPRS scale: 4/10 Pain location: right knee Pain description: tight Aggravating factors: standing, walking, bending Relieving factors: pain meds; ice  PRECAUTIONS: Fall  RED FLAGS: None   WEIGHT BEARING RESTRICTIONS: No  FALLS:  Has patient fallen in last 6 months? No  LIVING ENVIRONMENT: Lives with: lives with their spouse Lives in: House/apartment Stairs: Yes: Internal: 11 steps; on right going upwasher and dryer downstairs Has following equipment at home: Single point cane, Walker - 2 wheeled, and bed side commode  OCCUPATION: retired  PLOF: Independent  PATIENT GOALS: to walk and bend better  NEXT MD VISIT: PRN  OBJECTIVE:  Note: Objective measures were completed at Evaluation unless otherwise noted.  DIAGNOSTIC FINDINGS:   PATIENT SURVEYS:  LEFS  Extreme difficulty/unable (0), Quite a bit of difficulty (1), Moderate difficulty (2), Little difficulty (3), No difficulty (4) Survey date:    Any of your usual work, housework or school activities   2. Usual hobbies,  recreational or sporting activities   3. Getting into/out of the bath   4. Walking between rooms   5. Putting on socks/shoes   6. Squatting    7. Lifting an object, like a bag of groceries from the floor   8. Performing light activities around your home   9. Performing heavy activities around your home   10. Getting into/out of a car   11. Walking 2 blocks   12. Walking 1 mile   13. Going up/down 10 stairs (1 flight)   14. Standing for 1 hour   15.  sitting for 1 hour   16. Running on even ground   17. Running on uneven ground   18. Making sharp turns while running fast   19. Hopping    20. Rolling over in bed   Score total:  14/80 17.5%     COGNITION: Overall cognitive status: Within functional limits for tasks assessed     SENSATION: WFL  EDEMA:  Normal for this time s/p   POSTURE: rounded shoulders, forward head, and flexed trunk   PALPATION: General soreness normal for this time s/p  LOWER EXTREMITY ROM:  Active ROM Right eval Left eval Right 05/01/24 Right 05/05/24 Right 05/10/24  Hip flexion       Hip extension       Hip abduction       Hip adduction       Hip internal rotation       Hip external rotation       Knee flexion 75 supine  86 AAROM 101 AAROM 110 AAROM  Knee extension -4 0   0 AAROM  Ankle dorsiflexion       Ankle plantarflexion       Ankle inversion       Ankle eversion        (Blank rows = not tested)  LOWER EXTREMITY MMT:  MMT Right eval Left eval  Hip flexion 3-   Hip extension    Hip abduction    Hip adduction    Hip internal rotation    Hip external rotation    Knee flexion    Knee extension 3-   Ankle dorsiflexion    Ankle plantarflexion    Ankle inversion    Ankle eversion     (Blank rows = not tested)  FUNCTIONAL TESTS:  5 times sit to stand: 31.71 sec using hands heavily on walker; right foot advanced 2 minute walk test: 145 ft with RW  GAIT: Distance walked: 145 ft Assistive device utilized: Walker - 2  wheeled Level of assistance: SBA Comments: antalgic gait; decreased gait speed  TREATMENT DATE:  05/15/24: Bike, seat 8, full revolutions, 5' Seated knee flexion, towel under foot, 15 holds, 10x Seated contract relax into pt max flexion, 15 hold at max, 3 x AROM measure: 110 flexion Supine AAROM into flexion with towel under foot and strap, 5x, 5 holds AAROM: 113 deg Supine hamstring curl with physio-ball, 2x10 STS from chair height, 2x10, v cues for weight shift to R Forward step ups with RLE leading on 6 inch step, BUE support, 10x  8 inch step, 10x, 1 UE support   05/12/24: Gait training with SPC, cueing for proper hand use, good sequence Bike seat 9 initial rocking then full revolution Standing by // bars: - Knee drive on 87pw step 5x 10 for flexion. - Heel raise incline slope 15x - Toe raise decline slope 15x  - STS 15x no HHA - Squat front of chair 10x - Lateral step up 4in with 2 UE support 10x cueing for mechanics - Forward step up 6in 15x 1 UE support - TKE 2Pl 10x 5 - Leg press 2Pl 2x 10 5 holds - Slant board 3x 30 - Hamstring stretch 3x 30 on 12in step Prone:  - quad stretch 3x 30 Supine:  - AROM 0-107 degrees  - AAOM 0-110 degrees  05/10/24 Supine: Quad sets 5 hold x 10 Heel slides x 10 with strap then 5 with PT overpressure; right knee flexion 110 AAROM SAQ's 3# with 2 hold 2 x 10 Walking in // bars with left UE assist only down and back x 4 Standing: 12 step right knee drives for flexion x 2' Heel raises 2 x 10 6 box step ups 2 x 10 Bike seat 8 x 5' full forward revolutions after initial 30 sec   PATIENT EDUCATION:  Education details: Patient educated on exam findings, POC, scope of PT, HEP, and what to expect next visit. Person educated: Patient Education method: Explanation, Demonstration, and  Handouts Education comprehension: verbalized understanding, returned demonstration, verbal cues required, and tactile cues required  HOME EXERCISE PROGRAM: 05/01/24- Supine Knee Extension Strengthening  - 1 x daily - 7 x weekly - 2 sets - 10 reps Access Code: [email or web address] URL: [email or web address] Date: 04/27/2024 Prepared by: [facility]  Exercises - Seated Heel Toe Raises  - 1 x daily - 7 x weekly - 3 sets - 10 reps - Seated Heel Slide  - 1 x daily - 7 x weekly - 2 sets - 10 reps - Supine Ankle Pumps  - 1 x daily - 7 x weekly - 2 sets - 10 reps - Supine Quad Set  - 1 x daily - 7 x weekly - 2 sets - 10 reps - 5 sec hold - Supine Heel Slide  - 1 x daily - 7 x weekly - 2 sets - 10 reps - Supine Heel Slide with Strap  - 1 x daily - 7 x weekly - 2 sets - 10 reps  ASSESSMENT:  CLINICAL IMPRESSION: Began on recumbent bike for dynamic warm up. Patient able to achieve full revolutions with little pain. Followed with activities focused on increasing knee flexion ROM. AROM measurements this date: 110 deg, AAROM: 113 deg. Remainder of session spent focused on mobility and strengthening. Patient will benefit from continued skilled physical therapy in order to improve ROM, strength, endurance, and balance to improve function and quality of life.     Eval: Patient is a 73 y.o. female who was seen today for physical therapy evaluation and treatment for M17.11 (ICD-10-CM) -  Primary osteoarthritis of right knee; s/p right TKA 04/25/24. Patient demonstrates muscle weakness, reduced ROM, gait impairments and fascial restrictions which are likely contributing to symptoms of pain and are negatively impacting patient ability to perform ADLs and functional mobility tasks. Patient will benefit from skilled physical therapy services to address these deficits to reduce pain and improve level of function with ADLs and functional mobility tasks.   OBJECTIVE IMPAIRMENTS: Abnormal gait, decreased activity  tolerance, difficulty walking, decreased ROM, decreased strength, increased fascial restrictions, impaired perceived functional ability, and pain.   ACTIVITY LIMITATIONS: carrying, lifting, bending, sitting, standing, squatting, sleeping, stairs, transfers, bed mobility, bathing, toileting, dressing, and locomotion level  PARTICIPATION LIMITATIONS: meal prep, cleaning, laundry, driving, shopping, community activity, and yard work  [facility] POTENTIAL: Good  CLINICAL DECISION MAKING: Evolving/moderate complexity  EVALUATION COMPLEXITY: Moderate   GOALS: Goals reviewed with patient? No  SHORT TERM GOALS: Target date: 05/11/2024 patient will be independent with initial HEP  Baseline: Goal status: met  2.  Patient will report 50% improvement overall  Baseline:  Goal status: in progress  3.  Patient will increase right knee mobility to -2 to 90 to promote normal navigation of steps; step over step pattern  Baseline: see above Goal status: met     LONG TERM GOALS: Target date: 1024/2025  Patient will be independent in self management strategies to improve quality of life and functional outcomes.  Baseline:  Goal status:in progress  2.  Patient will report 70% improvement overall  Baseline:  Goal status: in progress   3.  Patient will improve LEFS score by 26 points to demonstrate improved perceived function  Baseline:  Goal status: in progress  4.  Patient will increase right knee mobility to -2 to 120 to promote normal navigation of steps; step over step pattern  Baseline: -4 to 75 Goal status: in progress  5.   Patient will increase right leg MMT's to 5/5 to allow navigation of steps without gait deviation or loss of balance  Baseline: see above Goal status: in progress  PLAN:  PT FREQUENCY: 2x/week to 3 times a week  PT DURATION: 6 weeks  PLANNED INTERVENTIONS: 97164- PT Re-evaluation, 97110-Therapeutic exercises, 97530- Therapeutic activity, 97112-  Neuromuscular re-education, 97535- Self Care, [id number]- Manual therapy, [id number]- Gait training, [phone]- Orthotic Fit/training, [phone]- Canalith repositioning, [id number]- Aquatic Therapy, 97760- Splinting, 97597- Wound care (first 20 sq cm), 97598- Wound care (each additional 20 sq cm)Patient/Family education, Balance training, Stair training, Taping, Dry Needling, Joint mobilization, Joint manipulation, Spinal manipulation, Spinal mobilization, Scar mobilization, and DME instructions.   PLAN FOR NEXT SESSION:  right knee mobility and strength; gait training.  Trial contract/relax in prone to improve flexion.  9:47 AM, 05/15/24 Rosaria Settler, PT, DPT [facility] [employer]

## 2024-05-17 ENCOUNTER — Encounter (HOSPITAL_COMMUNITY): Payer: Self-pay

## 2024-05-17 ENCOUNTER — Ambulatory Visit (HOSPITAL_COMMUNITY): Attending: Orthopedic Surgery

## 2024-05-17 DIAGNOSIS — M25561 Pain in right knee: Secondary | ICD-10-CM | POA: Diagnosis not present

## 2024-05-17 DIAGNOSIS — M25661 Stiffness of right knee, not elsewhere classified: Secondary | ICD-10-CM | POA: Insufficient documentation

## 2024-05-17 DIAGNOSIS — R262 Difficulty in walking, not elsewhere classified: Secondary | ICD-10-CM | POA: Insufficient documentation

## 2024-05-17 NOTE — Therapy (Signed)
 OUTPATIENT PHYSICAL THERAPY LOWER EXTREMITY TREATMENT   Patient Name: Stephanie [name] MRN: [id number] DOB:1950-08-28, 73 y.o., female Today's Date: 05/17/2024  END OF SESSION:  PT End of Session - 05/17/24 0912     Visit Number 9    Number of Visits 12    Date for Recertification  06/09/24    Authorization Type [employer]    Authorization Time Period no auth required    Progress Note Due on Visit 10    PT Start Time 0903    PT Stop Time 0943    PT Time Calculation (min) 40 min    Activity Tolerance Patient tolerated treatment well    Behavior During Therapy [facility] for tasks assessed/performed           Past Medical History:  Diagnosis Date   Diabetes mellitus without complication (HCC)    GERD (gastroesophageal reflux disease)    Hypercholesteremia    Hypertension    Hypothyroidism    PONV (postoperative nausea and vomiting)    Sleep apnea    Past Surgical History:  Procedure Laterality Date   ABDOMINAL HYSTERECTOMY     BREAST BIOPSY Left 2018   HYALINIZED FIBROADENOMATOID NODULE WITH CALCIFICATIONS   COLONOSCOPY N/A 12/26/2012   Procedure: COLONOSCOPY;  Surgeon: Lamar CHRISTELLA Hollingshead, MD;  Location: [facility];  Service: Endoscopy;  Laterality: N/A;  9:15 AM   COLONOSCOPY WITH PROPOFOL  N/A 04/26/2023   Procedure: COLONOSCOPY WITH PROPOFOL ;  Surgeon: Hollingshead Lamar CHRISTELLA, MD;  Location: [facility];  Service: Endoscopy;  Laterality: N/A;  930am, asa 3   TOTAL KNEE ARTHROPLASTY Left 03/04/2016   Procedure: LEFT TOTAL KNEE ARTHROPLASTY;  Surgeon: Taft FORBES Minerva, MD;  Location: [facility];  Service: Orthopedics;  Laterality: Left;   TOTAL KNEE ARTHROPLASTY Right 04/25/2024   Procedure: ARTHROPLASTY, KNEE, TOTAL;  Surgeon: Minerva Taft FORBES, MD;  Location: [facility];  Service: Orthopedics;  Laterality: Right;   TUBAL LIGATION     Patient Active Problem List   Diagnosis Date Noted   Osteoarthritis of right knee 04/25/2024   Hypertension, essential 12/16/2022   OSA  (obstructive sleep apnea) 03/13/2022   S/P total knee replacement, left 03/04/16    Arthritis of knee, degenerative 02/23/2013   ACHILLES TENDINITIS 06/04/2009   ANKLE PAIN, LEFT 12/01/2007    PCP: Sheryle Carwin, MD  REFERRING PROVIDER: Minerva Taft, MD Next apt: 05/29/24:  REFERRING DIAG: s/p right TKA  THERAPY DIAG:  Acute pain of right knee  Stiffness of right knee, not elsewhere classified  Difficulty in walking, not elsewhere classified  Rationale for Evaluation and Treatment: Rehabilitation  ONSET DATE: 04/25/24  SUBJECTIVE:   SUBJECTIVE STATEMENT: No reports of pain today, does c/o knee stiffness today.  HEP going well.   Eval:Right knee worn out; s/p right knee TKA 04/25/24 per harrison; discharged home yesterday; has a CPM at home  PERTINENT HISTORY: Left TKA 2017 per Minerva PAIN:  Are you having pain? Yes: NPRS scale: 4/10 Pain location: right knee Pain description: tight Aggravating factors: standing, walking, bending Relieving factors: pain meds; ice  PRECAUTIONS: Fall  RED FLAGS: None   WEIGHT BEARING RESTRICTIONS: No  FALLS:  Has patient fallen in last 6 months? No  LIVING ENVIRONMENT: Lives with: lives with their spouse Lives in: House/apartment Stairs: Yes: Internal: 11 steps; on right going upwasher and dryer downstairs Has following equipment at home: Single point cane, Walker - 2 wheeled, and bed side commode  OCCUPATION: retired  PLOF: Independent  PATIENT GOALS: to  walk and bend better  NEXT MD VISIT: PRN  OBJECTIVE:  Note: Objective measures were completed at Evaluation unless otherwise noted.  DIAGNOSTIC FINDINGS:   PATIENT SURVEYS:  LEFS  Extreme difficulty/unable (0), Quite a bit of difficulty (1), Moderate difficulty (2), Little difficulty (3), No difficulty (4) Survey date:    Any of your usual work, housework or school activities   2. Usual hobbies, recreational or sporting activities   3. Getting into/out of  the bath   4. Walking between rooms   5. Putting on socks/shoes   6. Squatting    7. Lifting an object, like a bag of groceries from the floor   8. Performing light activities around your home   9. Performing heavy activities around your home   10. Getting into/out of a car   11. Walking 2 blocks   12. Walking 1 mile   13. Going up/down 10 stairs (1 flight)   14. Standing for 1 hour   15.  sitting for 1 hour   16. Running on even ground   17. Running on uneven ground   18. Making sharp turns while running fast   19. Hopping    20. Rolling over in bed   Score total:  14/80 17.5%     COGNITION: Overall cognitive status: Within functional limits for tasks assessed     SENSATION: WFL  EDEMA:  Normal for this time s/p   POSTURE: rounded shoulders, forward head, and flexed trunk   PALPATION: General soreness normal for this time s/p  LOWER EXTREMITY ROM:  Active ROM Right eval Left eval Right 05/01/24 Right 05/05/24 Right 05/10/24 Right 10/01/225  Hip flexion        Hip extension        Hip abduction        Hip adduction        Hip internal rotation        Hip external rotation        Knee flexion 75 supine  86 AAROM 101 AAROM 110 AAROM AROM 115, AAROM 117  Knee extension -4 0   0 AAROM AROM 0  Ankle dorsiflexion        Ankle plantarflexion        Ankle inversion        Ankle eversion         (Blank rows = not tested)  LOWER EXTREMITY MMT:  MMT Right eval Left eval  Hip flexion 3-   Hip extension    Hip abduction    Hip adduction    Hip internal rotation    Hip external rotation    Knee flexion    Knee extension 3-   Ankle dorsiflexion    Ankle plantarflexion    Ankle inversion    Ankle eversion     (Blank rows = not tested)  FUNCTIONAL TESTS:  5 times sit to stand: 31.71 sec using hands heavily on walker; right foot advanced 2 minute walk test: 145 ft with RW  GAIT: Distance walked: 145 ft Assistive device utilized: Walker - 2 wheeled Level  of assistance: SBA Comments: antalgic gait; decreased gait speed  TREATMENT DATE:  05/17/24: Bike, seat 8, full revolutions, 5' Standing: - Knee drive on 87pw step 5x 10 - Hamstring curl 10x 5 at end range Prone: - quad stretch with rope 3x 30 - Contract/relax for flexion 5x 10 - Hamstring curl 10x Supine: - Bridge 10x - AROM 115 degrees, AAROM 117 Seated: - STS 15x no HHA eccentric control Standing: - Squat 15x - Reciprocal pattern 4in 1HR - 6in Rt up and Lt down 1 HR  - 6in lateral step up 15x  05/15/24: Bike, seat 8, full revolutions, 5' Seated knee flexion, towel under foot, 15 holds, 10x Seated contract relax into pt max flexion, 15 hold at max, 3 x AROM measure: 110 flexion Supine AAROM into flexion with towel under foot and strap, 5x, 5 holds AAROM: 113 deg Supine hamstring curl with physio-ball, 2x10 STS from chair height, 2x10, v cues for weight shift to R Forward step ups with RLE leading on 6 inch step, BUE support, 10x  8 inch step, 10x, 1 UE support   05/12/24: Gait training with SPC, cueing for proper hand use, good sequence Bike seat 9 initial rocking then full revolution Standing by // bars: - Knee drive on 87pw step 5x 10 for flexion. - Heel raise incline slope 15x - Toe raise decline slope 15x  - STS 15x no HHA - Squat front of chair 10x - Lateral step up 4in with 2 UE support 10x cueing for mechanics - Forward step up 6in 15x 1 UE support - TKE 2Pl 10x 5 - Leg press 2Pl 2x 10 5 holds - Slant board 3x 30 - Hamstring stretch 3x 30 on 12in step Prone:  - quad stretch 3x 30 Supine:  - AROM 0-107 degrees  - AAOM 0-110 degrees  05/10/24 Supine: Quad sets 5 hold x 10 Heel slides x 10 with strap then 5 with PT overpressure; right knee flexion 110 AAROM SAQ's 3# with 2 hold 2 x 10 Walking in // bars with  left UE assist only down and back x 4 Standing: 12 step right knee drives for flexion x 2' Heel raises 2 x 10 6 box step ups 2 x 10 Bike seat 8 x 5' full forward revolutions after initial 30 sec   PATIENT EDUCATION:  Education details: Patient educated on exam findings, POC, scope of PT, HEP, and what to expect next visit. Person educated: Patient Education method: Explanation, Demonstration, and Handouts Education comprehension: verbalized understanding, returned demonstration, verbal cues required, and tactile cues required  HOME EXERCISE PROGRAM: 05/01/24- Supine Knee Extension Strengthening  - 1 x daily - 7 x weekly - 2 sets - 10 reps Access Code: [email or web address] URL: [email or web address] Date: 04/27/2024 Prepared by: [facility]  Exercises - Seated Heel Toe Raises  - 1 x daily - 7 x weekly - 3 sets - 10 reps - Seated Heel Slide  - 1 x daily - 7 x weekly - 2 sets - 10 reps - Supine Ankle Pumps  - 1 x daily - 7 x weekly - 2 sets - 10 reps - Supine Quad Set  - 1 x daily - 7 x weekly - 2 sets - 10 reps - 5 sec hold - Supine Heel Slide  - 1 x daily - 7 x weekly - 2 sets - 10 reps - Supine Heel Slide with Strap  - 1 x daily - 7 x weekly - 2 sets - 10 reps  ASSESSMENT:  CLINICAL IMPRESSION: Began on recumbent bike for dynamic  warm up. Patient able to achieve full revolutions with little pain. Followed with activities focused on increasing knee flexion ROM. AROM measurements this date: 110 deg, AAROM: 113 deg. Remainder of session spent focused on mobility and strengthening. Patient will benefit from continued skilled physical therapy in order to improve ROM, strength, endurance, and balance to improve function and quality of life.   Session focus with knee mobility and proximal strengthening.  Began on bike for dynamic warm up and knee mobility.  Added prone contract/relax and hamstring strengthening exercises this session that was tolerated well.  AROM at 0-115, AAROM 117  degrees.  No reports of pain through session.    Eval: Patient is a 73 y.o. female who was seen today for physical therapy evaluation and treatment for M17.11 (ICD-10-CM) - Primary osteoarthritis of right knee; s/p right TKA 04/25/24. Patient demonstrates muscle weakness, reduced ROM, gait impairments and fascial restrictions which are likely contributing to symptoms of pain and are negatively impacting patient ability to perform ADLs and functional mobility tasks. Patient will benefit from skilled physical therapy services to address these deficits to reduce pain and improve level of function with ADLs and functional mobility tasks.   OBJECTIVE IMPAIRMENTS: Abnormal gait, decreased activity tolerance, difficulty walking, decreased ROM, decreased strength, increased fascial restrictions, impaired perceived functional ability, and pain.   ACTIVITY LIMITATIONS: carrying, lifting, bending, sitting, standing, squatting, sleeping, stairs, transfers, bed mobility, bathing, toileting, dressing, and locomotion level  PARTICIPATION LIMITATIONS: meal prep, cleaning, laundry, driving, shopping, community activity, and yard work  [facility] POTENTIAL: Good  CLINICAL DECISION MAKING: Evolving/moderate complexity  EVALUATION COMPLEXITY: Moderate   GOALS: Goals reviewed with patient? No  SHORT TERM GOALS: Target date: 05/11/2024 patient will be independent with initial HEP  Baseline: Goal status: met  2.  Patient will report 50% improvement overall  Baseline:  Goal status: in progress  3.  Patient will increase right knee mobility to -2 to 90 to promote normal navigation of steps; step over step pattern  Baseline: see above Goal status: met     LONG TERM GOALS: Target date: 1024/2025  Patient will be independent in self management strategies to improve quality of life and functional outcomes.  Baseline:  Goal status:in progress  2.  Patient will report 70% improvement overall  Baseline:   Goal status: in progress   3.  Patient will improve LEFS score by 26 points to demonstrate improved perceived function  Baseline:  Goal status: in progress  4.  Patient will increase right knee mobility to -2 to 120 to promote normal navigation of steps; step over step pattern  Baseline: -4 to 75 Goal status: in progress  5.   Patient will increase right leg MMT's to 5/5 to allow navigation of steps without gait deviation or loss of balance  Baseline: see above Goal status: in progress  PLAN:  PT FREQUENCY: 2x/week to 3 times a week  PT DURATION: 6 weeks  PLANNED INTERVENTIONS: 97164- PT Re-evaluation, 97110-Therapeutic exercises, 97530- Therapeutic activity, 97112- Neuromuscular re-education, 97535- Self Care, [id number]- Manual therapy, [id number]- Gait training, [phone]- Orthotic Fit/training, [phone]- Canalith repositioning, [id number]- Aquatic Therapy, 97760- Splinting, 97597- Wound care (first 20 sq cm), 97598- Wound care (each additional 20 sq cm)Patient/Family education, Balance training, Stair training, Taping, Dry Needling, Joint mobilization, Joint manipulation, Spinal manipulation, Spinal mobilization, Scar mobilization, and DME instructions.   PLAN FOR NEXT SESSION:  right knee mobility and strength; gait training.  10th visit progress note.  Add hip stability and balance activities.  Augustin Mclean, LPTA/CLT; CBIS [phone]  9:55 AM, 05/17/24

## 2024-05-18 DIAGNOSIS — S90111A Contusion of right great toe without damage to nail, initial encounter: Secondary | ICD-10-CM | POA: Diagnosis not present

## 2024-05-19 ENCOUNTER — Encounter: Payer: Self-pay | Admitting: Radiology

## 2024-05-19 ENCOUNTER — Ambulatory Visit (INDEPENDENT_AMBULATORY_CARE_PROVIDER_SITE_OTHER): Admitting: Orthopedic Surgery

## 2024-05-19 ENCOUNTER — Ambulatory Visit (HOSPITAL_COMMUNITY)

## 2024-05-19 DIAGNOSIS — R262 Difficulty in walking, not elsewhere classified: Secondary | ICD-10-CM

## 2024-05-19 DIAGNOSIS — M25661 Stiffness of right knee, not elsewhere classified: Secondary | ICD-10-CM | POA: Diagnosis not present

## 2024-05-19 DIAGNOSIS — M25561 Pain in right knee: Secondary | ICD-10-CM

## 2024-05-19 DIAGNOSIS — Z96651 Presence of right artificial knee joint: Secondary | ICD-10-CM

## 2024-05-19 NOTE — Progress Notes (Signed)
    05/19/2024   Chief Complaint  Patient presents with   Suture / Staple Removal    Has a stitch coming through incsion    No diagnosis found.  What pharmacy do you use ? ___[facility] ________________________  DOI/DOS/ Date: 04/25/24  Improved

## 2024-05-19 NOTE — Progress Notes (Signed)
    05/19/2024   Chief Complaint  Patient presents with   Suture / Staple Removal    Has a stitch coming through incision    Encounter Diagnosis  Name Primary?   S/P total knee replacement, right 04/25/24 Yes    What pharmacy do you use ? ___[facility] ________________________  DOI/DOS/ Date: 04/25/24  Superior one third of the wound there was a palpable structure which was clear.  Did not appear to look like the 0 Monocryl that was used to close the wound.  Not sure what this was but it was removed  It was cleaned with alcohol.  Patient advised to put a Band-Aid over it.  Seems to be nothing serious

## 2024-05-19 NOTE — Therapy (Addendum)
 OUTPATIENT PHYSICAL THERAPY LOWER EXTREMITY TREATMENT Progress Note Reporting Period 04/27/2024 to 05/19/2024  See note below for Objective Data and Assessment of Progress/Goals.       Patient Name: Stephanie [name] MRN: [id number] DOB:30-Jun-1951, 73 y.o., female Today's Date: 05/19/2024  END OF SESSION:  PT End of Session - 05/19/24 0900     Visit Number 10    Number of Visits 12    Date for Recertification  06/09/24    Authorization Type [employer]    Authorization Time Period no auth required    Progress Note Due on Visit 10    PT Start Time 0900    PT Stop Time 0940    PT Time Calculation (min) 40 min    Activity Tolerance Patient tolerated treatment well    Behavior During Therapy WFL for tasks assessed/performed           Past Medical History:  Diagnosis Date   Diabetes mellitus without complication (HCC)    GERD (gastroesophageal reflux disease)    Hypercholesteremia    Hypertension    Hypothyroidism    PONV (postoperative nausea and vomiting)    Sleep apnea    Past Surgical History:  Procedure Laterality Date   ABDOMINAL HYSTERECTOMY     BREAST BIOPSY Left 2018   HYALINIZED FIBROADENOMATOID NODULE WITH CALCIFICATIONS   COLONOSCOPY N/A 12/26/2012   Procedure: COLONOSCOPY;  Surgeon: Lamar CHRISTELLA Hollingshead, MD;  Location: [facility];  Service: Endoscopy;  Laterality: N/A;  9:15 AM   COLONOSCOPY WITH PROPOFOL  N/A 04/26/2023   Procedure: COLONOSCOPY WITH PROPOFOL ;  Surgeon: Hollingshead Lamar CHRISTELLA, MD;  Location: [facility];  Service: Endoscopy;  Laterality: N/A;  930am, asa 3   TOTAL KNEE ARTHROPLASTY Left 03/04/2016   Procedure: LEFT TOTAL KNEE ARTHROPLASTY;  Surgeon: Taft FORBES Minerva, MD;  Location: [facility];  Service: Orthopedics;  Laterality: Left;   TOTAL KNEE ARTHROPLASTY Right 04/25/2024   Procedure: ARTHROPLASTY, KNEE, TOTAL;  Surgeon: Minerva Taft FORBES, MD;  Location: [facility];  Service: Orthopedics;  Laterality: Right;   TUBAL LIGATION     Patient  Active Problem List   Diagnosis Date Noted   Osteoarthritis of right knee 04/25/2024   Hypertension, essential 12/16/2022   OSA (obstructive sleep apnea) 03/13/2022   S/P total knee replacement, left 03/04/16    Arthritis of knee, degenerative 02/23/2013   ACHILLES TENDINITIS 06/04/2009   ANKLE PAIN, LEFT 12/01/2007    PCP: Sheryle Carwin, MD  REFERRING PROVIDER: Minerva Taft, MD Next apt: 05/29/24:  REFERRING DIAG: s/p right TKA  THERAPY DIAG:  Acute pain of right knee  Stiffness of right knee, not elsewhere classified  Difficulty in walking, not elsewhere classified  Rationale for Evaluation and Treatment: Rehabilitation  ONSET DATE: 04/25/24  SUBJECTIVE:   SUBJECTIVE STATEMENT: 50 to 60% better; not too painful just aches and is stiff  Eval:Right knee worn out; s/p right knee TKA 04/25/24 per harrison; discharged home yesterday; has a CPM at home  PERTINENT HISTORY: Left TKA 2017 per Minerva PAIN:  Are you having pain? Yes: NPRS scale: 4/10 Pain location: right knee Pain description: tight Aggravating factors: standing, walking, bending Relieving factors: pain meds; ice  PRECAUTIONS: Fall  RED FLAGS: None   WEIGHT BEARING RESTRICTIONS: No  FALLS:  Has patient fallen in last 6 months? No  LIVING ENVIRONMENT: Lives with: lives with their spouse Lives in: House/apartment Stairs: Yes: Internal: 11 steps; on right going upwasher and dryer downstairs Has following equipment at home: Single point  cane, Walker - 2 wheeled, and bed side commode  OCCUPATION: retired  PLOF: Independent  PATIENT GOALS: to walk and bend better  NEXT MD VISIT: PRN  OBJECTIVE:  Note: Objective measures were completed at Evaluation unless otherwise noted.  DIAGNOSTIC FINDINGS:   PATIENT SURVEYS:  LEFS  Extreme difficulty/unable (0), Quite a bit of difficulty (1), Moderate difficulty (2), Little difficulty (3), No difficulty (4) Survey date:    Any of your usual work,  housework or school activities   2. Usual hobbies, recreational or sporting activities   3. Getting into/out of the bath   4. Walking between rooms   5. Putting on socks/shoes   6. Squatting    7. Lifting an object, like a bag of groceries from the floor   8. Performing light activities around your home   9. Performing heavy activities around your home   10. Getting into/out of a car   11. Walking 2 blocks   12. Walking 1 mile   13. Going up/down 10 stairs (1 flight)   14. Standing for 1 hour   15.  sitting for 1 hour   16. Running on even ground   17. Running on uneven ground   18. Making sharp turns while running fast   19. Hopping    20. Rolling over in bed   Score total:  14/80 17.5%     COGNITION: Overall cognitive status: Within functional limits for tasks assessed     SENSATION: WFL  EDEMA:  Normal for this time s/p   POSTURE: rounded shoulders, forward head, and flexed trunk   PALPATION: General soreness normal for this time s/p  LOWER EXTREMITY ROM:  Active ROM Right eval Left eval Right 05/01/24 Right 05/05/24 Right 05/10/24 Right 10/01/225  Hip flexion        Hip extension        Hip abduction        Hip adduction        Hip internal rotation        Hip external rotation        Knee flexion 75 supine  86 AAROM 101 AAROM 110 AAROM AROM 115, AAROM 117  Knee extension -4 0   0 AAROM AROM 0  Ankle dorsiflexion        Ankle plantarflexion        Ankle inversion        Ankle eversion         (Blank rows = not tested)  LOWER EXTREMITY MMT:  MMT Right eval Left eval Right 05/19/24  Hip flexion 3-  4  Hip extension     Hip abduction     Hip adduction     Hip internal rotation     Hip external rotation     Knee flexion   4 (sitting)  Knee extension 3-  4+  Ankle dorsiflexion   4+  Ankle plantarflexion     Ankle inversion     Ankle eversion      (Blank rows = not tested)  FUNCTIONAL TESTS:  5 times sit to stand: 31.71 sec using hands  heavily on walker; right foot advanced 2 minute walk test: 145 ft with RW  GAIT: Distance walked: 145 ft Assistive device utilized: Walker - 2 wheeled Level of assistance: SBA Comments: antalgic gait; decreased gait speed  TREATMENT DATE:  05/19/24 Mini progress note for 10th visit STM and manual ROM to right knee for flexion and extension x 10' AAROM right knee 0 to 115 MMT's see above 5 time sit to stand 17.81 sec no UE assist 2 MWT 330 ft with SPC Heel raises 2 x 10 LEFS 38/80; 47.5% Bike seat 8 full revolutions x 5'    05/17/24: Bike, seat 8, full revolutions, 5' Standing: - Knee drive on 87pw step 5x 10 - Hamstring curl 10x 5 at end range Prone: - quad stretch with rope 3x 30 - Contract/relax for flexion 5x 10 - Hamstring curl 10x Supine: - Bridge 10x - AROM 115 degrees, AAROM 117 Seated: - STS 15x no HHA eccentric control Standing: - Squat 15x - Reciprocal pattern 4in 1HR - 6in Rt up and Lt down 1 HR  - 6in lateral step up 15x  05/15/24: Bike, seat 8, full revolutions, 5' Seated knee flexion, towel under foot, 15 holds, 10x Seated contract relax into pt max flexion, 15 hold at max, 3 x AROM measure: 110 flexion Supine AAROM into flexion with towel under foot and strap, 5x, 5 holds AAROM: 113 deg Supine hamstring curl with physio-ball, 2x10 STS from chair height, 2x10, v cues for weight shift to R Forward step ups with RLE leading on 6 inch step, BUE support, 10x  8 inch step, 10x, 1 UE support   05/12/24: Gait training with SPC, cueing for proper hand use, good sequence Bike seat 9 initial rocking then full revolution Standing by // bars: - Knee drive on 87pw step 5x 10 for flexion. - Heel raise incline slope 15x - Toe raise decline slope 15x  - STS 15x no HHA - Squat front of chair 10x - Lateral step up 4in with 2  UE support 10x cueing for mechanics - Forward step up 6in 15x 1 UE support - TKE 2Pl 10x 5 - Leg press 2Pl 2x 10 5 holds - Slant board 3x 30 - Hamstring stretch 3x 30 on 12in step Prone:  - quad stretch 3x 30 Supine:  - AROM 0-107 degrees  - AAOM 0-110 degrees  05/10/24 Supine: Quad sets 5 hold x 10 Heel slides x 10 with strap then 5 with PT overpressure; right knee flexion 110 AAROM SAQ's 3# with 2 hold 2 x 10 Walking in // bars with left UE assist only down and back x 4 Standing: 12 step right knee drives for flexion x 2' Heel raises 2 x 10 6 box step ups 2 x 10 Bike seat 8 x 5' full forward revolutions after initial 30 sec   PATIENT EDUCATION:  Education details: Patient educated on exam findings, POC, scope of PT, HEP, and what to expect next visit. Person educated: Patient Education method: Explanation, Demonstration, and Handouts Education comprehension: verbalized understanding, returned demonstration, verbal cues required, and tactile cues required  HOME EXERCISE PROGRAM: 05/01/24- Supine Knee Extension Strengthening  - 1 x daily - 7 x weekly - 2 sets - 10 reps Access Code: [email or web address] URL: [email or web address] Date: 04/27/2024 Prepared by: [facility]  Exercises - Seated Heel Toe Raises  - 1 x daily - 7 x weekly - 3 sets - 10 reps - Seated Heel Slide  - 1 x daily - 7 x weekly - 2 sets - 10 reps - Supine Ankle Pumps  - 1 x daily - 7 x weekly - 2 sets - 10 reps - Supine Quad Set  - 1 x daily - 7  x weekly - 2 sets - 10 reps - 5 sec hold - Supine Heel Slide  - 1 x daily - 7 x weekly - 2 sets - 10 reps - Supine Heel Slide with Strap  - 1 x daily - 7 x weekly - 2 sets - 10 reps  ASSESSMENT:  CLINICAL IMPRESSION: 10th visit mini progress note.  On note during manual noted that patient appears to have a stitch that did not dissolve coming through.  Messaged MD office regarding. Patient with good progress with all objective measures and with LEFS.    Patient will benefit from continued skilled physical therapy in order to improve ROM, strength, endurance, and balance to improve function and quality of life.   Session focus with knee mobility and proximal strengthening.  Began on bike for dynamic warm up and knee mobility.  Added prone contract/relax and hamstring strengthening exercises this session that was tolerated well.  AROM at 0-115, AAROM 117 degrees.  No reports of pain through session.    Eval: Patient is a 73 y.o. female who was seen today for physical therapy evaluation and treatment for M17.11 (ICD-10-CM) - Primary osteoarthritis of right knee; s/p right TKA 04/25/24. Patient demonstrates muscle weakness, reduced ROM, gait impairments and fascial restrictions which are likely contributing to symptoms of pain and are negatively impacting patient ability to perform ADLs and functional mobility tasks. Patient will benefit from skilled physical therapy services to address these deficits to reduce pain and improve level of function with ADLs and functional mobility tasks.   OBJECTIVE IMPAIRMENTS: Abnormal gait, decreased activity tolerance, difficulty walking, decreased ROM, decreased strength, increased fascial restrictions, impaired perceived functional ability, and pain.   ACTIVITY LIMITATIONS: carrying, lifting, bending, sitting, standing, squatting, sleeping, stairs, transfers, bed mobility, bathing, toileting, dressing, and locomotion level  PARTICIPATION LIMITATIONS: meal prep, cleaning, laundry, driving, shopping, community activity, and yard work  [facility] POTENTIAL: Good  CLINICAL DECISION MAKING: Evolving/moderate complexity  EVALUATION COMPLEXITY: Moderate   GOALS: Goals reviewed with patient? No  SHORT TERM GOALS: Target date: 05/11/2024 patient will be independent with initial HEP  Baseline: Goal status: met  2.  Patient will report 50% improvement overall  Baseline:  Goal status: met  3.  Patient will increase  right knee mobility to -2 to 90 to promote normal navigation of steps; step over step pattern  Baseline: see above Goal status: met     LONG TERM GOALS: Target date: 1024/2025  Patient will be independent in self management strategies to improve quality of life and functional outcomes.  Baseline:  Goal status:in progress  2.  Patient will report 70% improvement overall  Baseline:  Goal status: in progress   3.  Patient will improve LEFS score by 26 points to demonstrate improved perceived function  Baseline: 05/19/24 38/80 (24 points) Goal status: in progress  4.  Patient will increase right knee mobility to -2 to 120 to promote normal navigation of steps; step over step pattern  Baseline: -4 to 75; 05/19/24 0 to 115 Goal status: in progress  5.   Patient will increase right leg MMT's to 5/5 to allow navigation of steps without gait deviation or loss of balance  Baseline: see above Goal status: in progress  PLAN:  PT FREQUENCY: 2x/week to 3 times a week  PT DURATION: 6 weeks  PLANNED INTERVENTIONS: [id number]- PT Re-evaluation, 97110-Therapeutic exercises, 97530- Therapeutic activity, [id number]- Neuromuscular re-education, 97535- Self Care, [id number]- Manual therapy, [id number]- Gait training, [id number]- Orthotic Fit/training, [id number]- Canalith repositioning, [id number]-  Aquatic Therapy, [id number]- Splinting, [id number]- Wound care (first 20 sq cm), 97598- Wound care (each additional 20 sq cm)Patient/Family education, Balance training, Stair training, Taping, Dry Needling, Joint mobilization, Joint manipulation, Spinal manipulation, Spinal mobilization, Scar mobilization, and DME instructions.   PLAN FOR NEXT SESSION:  right knee mobility and strength; gait training.    Add hip stability and balance activities.decrease to 2 x a week going forward  9:40 AM, 05/19/24 Mahogony Gilchrest Small Emnet Monk MPT [place] [facility] [place] [phone]

## 2024-05-22 ENCOUNTER — Ambulatory Visit (HOSPITAL_COMMUNITY)

## 2024-05-22 DIAGNOSIS — R262 Difficulty in walking, not elsewhere classified: Secondary | ICD-10-CM | POA: Diagnosis not present

## 2024-05-22 DIAGNOSIS — M25661 Stiffness of right knee, not elsewhere classified: Secondary | ICD-10-CM

## 2024-05-22 DIAGNOSIS — M25561 Pain in right knee: Secondary | ICD-10-CM | POA: Diagnosis not present

## 2024-05-22 NOTE — Therapy (Signed)
 OUTPATIENT PHYSICAL THERAPY LOWER EXTREMITY TREATMENT     Patient Name: Stephanie [name] MRN: [id number] DOB:03-18-1951, 73 y.o., female Today's Date: 05/22/2024  END OF SESSION:  PT End of Session - 05/22/24 0904     Visit Number 11    Number of Visits 20    Date for Recertification  06/16/24    Authorization Type [employer]    Authorization Time Period no auth required    Progress Note Due on Visit 10    PT Start Time 0854    PT Stop Time 0936    PT Time Calculation (min) 42 min    Activity Tolerance Patient tolerated treatment well    Behavior During Therapy [facility] for tasks assessed/performed           Past Medical History:  Diagnosis Date   Diabetes mellitus without complication (HCC)    GERD (gastroesophageal reflux disease)    Hypercholesteremia    Hypertension    Hypothyroidism    PONV (postoperative nausea and vomiting)    Sleep apnea    Past Surgical History:  Procedure Laterality Date   ABDOMINAL HYSTERECTOMY     BREAST BIOPSY Left 2018   HYALINIZED FIBROADENOMATOID NODULE WITH CALCIFICATIONS   COLONOSCOPY N/A 12/26/2012   Procedure: COLONOSCOPY;  Surgeon: Lamar CHRISTELLA Hollingshead, MD;  Location: [facility];  Service: Endoscopy;  Laterality: N/A;  9:15 AM   COLONOSCOPY WITH PROPOFOL  N/A 04/26/2023   Procedure: COLONOSCOPY WITH PROPOFOL ;  Surgeon: Hollingshead Lamar CHRISTELLA, MD;  Location: [facility];  Service: Endoscopy;  Laterality: N/A;  930am, asa 3   TOTAL KNEE ARTHROPLASTY Left 03/04/2016   Procedure: LEFT TOTAL KNEE ARTHROPLASTY;  Surgeon: Taft FORBES Minerva, MD;  Location: [facility];  Service: Orthopedics;  Laterality: Left;   TOTAL KNEE ARTHROPLASTY Right 04/25/2024   Procedure: ARTHROPLASTY, KNEE, TOTAL;  Surgeon: Minerva Taft FORBES, MD;  Location: [facility];  Service: Orthopedics;  Laterality: Right;   TUBAL LIGATION     Patient Active Problem List   Diagnosis Date Noted   Osteoarthritis of right knee 04/25/2024   Hypertension, essential 12/16/2022    OSA (obstructive sleep apnea) 03/13/2022   S/P total knee replacement, left 03/04/16    Arthritis of knee, degenerative 02/23/2013   ACHILLES TENDINITIS 06/04/2009   ANKLE PAIN, LEFT 12/01/2007    PCP: Sheryle Carwin, MD  REFERRING PROVIDER: Minerva Taft, MD Next apt: 05/29/24:  REFERRING DIAG: s/p right TKA  THERAPY DIAG:  Acute pain of right knee  Stiffness of right knee, not elsewhere classified  Difficulty in walking, not elsewhere classified  Rationale for Evaluation and Treatment: Rehabilitation  ONSET DATE: 04/25/24  SUBJECTIVE:   SUBJECTIVE STATEMENT: MD office took out protruding suture; feeling fine: still having stiffness  Eval:Right knee worn out; s/p right knee TKA 04/25/24 per harrison; discharged home yesterday; has a CPM at home  PERTINENT HISTORY: Left TKA 2017 per Minerva PAIN:  Are you having pain? Yes: NPRS scale: 4/10 Pain location: right knee Pain description: tight Aggravating factors: standing, walking, bending Relieving factors: pain meds; ice  PRECAUTIONS: Fall  RED FLAGS: None   WEIGHT BEARING RESTRICTIONS: No  FALLS:  Has patient fallen in last 6 months? No  LIVING ENVIRONMENT: Lives with: lives with their spouse Lives in: House/apartment Stairs: Yes: Internal: 11 steps; on right going upwasher and dryer downstairs Has following equipment at home: Single point cane, Walker - 2 wheeled, and bed side commode  OCCUPATION: retired  PLOF: Independent  PATIENT GOALS: to walk and  bend better  NEXT MD VISIT: PRN  OBJECTIVE:  Note: Objective measures were completed at Evaluation unless otherwise noted.  DIAGNOSTIC FINDINGS:   PATIENT SURVEYS:  LEFS  Extreme difficulty/unable (0), Quite a bit of difficulty (1), Moderate difficulty (2), Little difficulty (3), No difficulty (4) Survey date:    Any of your usual work, housework or school activities   2. Usual hobbies, recreational or sporting activities   3. Getting into/out of  the bath   4. Walking between rooms   5. Putting on socks/shoes   6. Squatting    7. Lifting an object, like a bag of groceries from the floor   8. Performing light activities around your home   9. Performing heavy activities around your home   10. Getting into/out of a car   11. Walking 2 blocks   12. Walking 1 mile   13. Going up/down 10 stairs (1 flight)   14. Standing for 1 hour   15.  sitting for 1 hour   16. Running on even ground   17. Running on uneven ground   18. Making sharp turns while running fast   19. Hopping    20. Rolling over in bed   Score total:  14/80 17.5%     COGNITION: Overall cognitive status: Within functional limits for tasks assessed     SENSATION: WFL  EDEMA:  Normal for this time s/p   POSTURE: rounded shoulders, forward head, and flexed trunk   PALPATION: General soreness normal for this time s/p  LOWER EXTREMITY ROM:  Active ROM Right eval Left eval Right 05/01/24 Right 05/05/24 Right 05/10/24 Right 10/01/225  Hip flexion        Hip extension        Hip abduction        Hip adduction        Hip internal rotation        Hip external rotation        Knee flexion 75 supine  86 AAROM 101 AAROM 110 AAROM AROM 115, AAROM 117  Knee extension -4 0   0 AAROM AROM 0  Ankle dorsiflexion        Ankle plantarflexion        Ankle inversion        Ankle eversion         (Blank rows = not tested)  LOWER EXTREMITY MMT:  MMT Right eval Left eval Right 05/19/24  Hip flexion 3-  4  Hip extension     Hip abduction     Hip adduction     Hip internal rotation     Hip external rotation     Knee flexion   4 (sitting)  Knee extension 3-  4+  Ankle dorsiflexion   4+  Ankle plantarflexion     Ankle inversion     Ankle eversion      (Blank rows = not tested)  FUNCTIONAL TESTS:  5 times sit to stand: 31.71 sec using hands heavily on walker; right foot advanced 2 minute walk test: 145 ft with RW  GAIT: Distance walked: 145 ft Assistive  device utilized: Walker - 2 wheeled Level of assistance: SBA Comments: antalgic gait; decreased gait speed  TREATMENT DATE:  05/22/24 STM and manual ROM to right knee x 10' to decrease stiffness and improve joint extensibility AAROM right knee 118 today Heel raises on incline 2 x 10 Toe raises on decline 2 x 10 Squats to chair for target 2 x 10 6 box step ups 2 x 10 6 box lateral step ups 2 x 10 Tandem stance 2 x 30 Hip abduction  x 10 each Hip extension  x 10 each Bike seat 8 x 5' full revolutions Updated HEP   05/19/24 Mini progress note for 10th visit STM and manual ROM to right knee for flexion and extension x 10' AAROM right knee 0 to 115 MMT's see above 5 time sit to stand 17.81 sec no UE assist 2 MWT 330 ft with SPC Heel raises 2 x 10 LEFS 38/80; 47.5% Bike seat 8 full revolutions x 5'    05/17/24: Bike, seat 8, full revolutions, 5' Standing: - Knee drive on 87pw step 5x 10 - Hamstring curl 10x 5 at end range Prone: - quad stretch with rope 3x 30 - Contract/relax for flexion 5x 10 - Hamstring curl 10x Supine: - Bridge 10x - AROM 115 degrees, AAROM 117 Seated: - STS 15x no HHA eccentric control Standing: - Squat 15x - Reciprocal pattern 4in 1HR - 6in Rt up and Lt down 1 HR  - 6in lateral step up 15x  05/15/24: Bike, seat 8, full revolutions, 5' Seated knee flexion, towel under foot, 15 holds, 10x Seated contract relax into pt max flexion, 15 hold at max, 3 x AROM measure: 110 flexion Supine AAROM into flexion with towel under foot and strap, 5x, 5 holds AAROM: 113 deg Supine hamstring curl with physio-ball, 2x10 STS from chair height, 2x10, v cues for weight shift to R Forward step ups with RLE leading on 6 inch step, BUE support, 10x  8 inch step, 10x, 1 UE support   05/12/24: Gait training with SPC, cueing for  proper hand use, good sequence Bike seat 9 initial rocking then full revolution Standing by // bars: - Knee drive on 87pw step 5x 10 for flexion. - Heel raise incline slope 15x - Toe raise decline slope 15x  - STS 15x no HHA - Squat front of chair 10x - Lateral step up 4in with 2 UE support 10x cueing for mechanics - Forward step up 6in 15x 1 UE support - TKE 2Pl 10x 5 - Leg press 2Pl 2x 10 5 holds - Slant board 3x 30 - Hamstring stretch 3x 30 on 12in step Prone:  - quad stretch 3x 30 Supine:  - AROM 0-107 degrees  - AAOM 0-110 degrees  05/10/24 Supine: Quad sets 5 hold x 10 Heel slides x 10 with strap then 5 with PT overpressure; right knee flexion 110 AAROM SAQ's 3# with 2 hold 2 x 10 Walking in // bars with left UE assist only down and back x 4 Standing: 12 step right knee drives for flexion x 2' Heel raises 2 x 10 6 box step ups 2 x 10 Bike seat 8 x 5' full forward revolutions after initial 30 sec   PATIENT EDUCATION:  Education details: Patient educated on exam findings, POC, scope of PT, HEP, and what to expect next visit. Person educated: Patient Education method: Explanation, Demonstration, and Handouts Education comprehension: verbalized understanding, returned demonstration, verbal cues required, and tactile cues required  HOME EXERCISE PROGRAM: Access Code: [email or web address] URL: [email or web address] Date: 05/22/2024 Prepared by: [facility]  Exercises - Seated  Heel Toe Raises  - 1 x daily - 7 x weekly - 3 sets - 10 reps - Seated Heel Slide  - 1 x daily - 7 x weekly - 2 sets - 10 reps - Supine Heel Slide  - 1 x daily - 7 x weekly - 2 sets - 10 reps - Supine Knee Extension Strengthening  - 1 x daily - 7 x weekly - 2 sets - 10 reps - Supine Active Straight Leg Raise  - 1 x daily - 7 x weekly - 3 sets - 10 reps - Standing Ankle Plantar Flexion Dorsiflexion with Counter Support  - 1 x daily - 7 x weekly - 2 sets - 10 reps - Mini Squat with  Counter Support  - 1 x daily - 7 x weekly - 2 sets - 10 reps - tandem stance balance; try not to hold on  - 1 x daily - 7 x weekly - 1 sets - 3 reps - 30 sec hold - Standing Hip Abduction with Counter Support  - 1 x daily - 7 x weekly - 2 sets - 10 reps - Standing Hip Extension with Counter Support  - 1 x daily - 7 x weekly - 2 sets - 10 reps  05/01/24- Supine Knee Extension Strengthening  - 1 x daily - 7 x weekly - 2 sets - 10 reps Access Code: [email or web address] URL: [email or web address] Date: 04/27/2024 Prepared by: [facility]  Exercises - Seated Heel Toe Raises  - 1 x daily - 7 x weekly - 3 sets - 10 reps - Seated Heel Slide  - 1 x daily - 7 x weekly - 2 sets - 10 reps - Supine Ankle Pumps  - 1 x daily - 7 x weekly - 2 sets - 10 reps - Supine Quad Set  - 1 x daily - 7 x weekly - 2 sets - 10 reps - 5 sec hold - Supine Heel Slide  - 1 x daily - 7 x weekly - 2 sets - 10 reps - Supine Heel Slide with Strap  - 1 x daily - 7 x weekly - 2 sets - 10 reps  ASSESSMENT:  CLINICAL IMPRESSION: 10th visit mini progress note.  On note during manual noted that patient appears to have a stitch that did not dissolve coming through.  Messaged MD office regarding. Patient with good progress with all objective measures and with LEFS.   Patient will benefit from continued skilled physical therapy in order to improve ROM, strength, endurance, and balance to improve function and quality of life.   Session focus with knee mobility and proximal strengthening.  Began on bike for dynamic warm up and knee mobility.  Added prone contract/relax and hamstring strengthening exercises this session that was tolerated well.  AROM at 0-115, AAROM 117 degrees.  No reports of pain through session.    Eval: Patient is a 73 y.o. female who was seen today for physical therapy evaluation and treatment for M17.11 (ICD-10-CM) - Primary osteoarthritis of right knee; s/p right TKA 04/25/24. Patient demonstrates muscle weakness,  reduced ROM, gait impairments and fascial restrictions which are likely contributing to symptoms of pain and are negatively impacting patient ability to perform ADLs and functional mobility tasks. Patient will benefit from skilled physical therapy services to address these deficits to reduce pain and improve level of function with ADLs and functional mobility tasks.   OBJECTIVE IMPAIRMENTS: Abnormal gait, decreased activity tolerance, difficulty walking, decreased ROM, decreased strength, increased fascial  restrictions, impaired perceived functional ability, and pain.   ACTIVITY LIMITATIONS: carrying, lifting, bending, sitting, standing, squatting, sleeping, stairs, transfers, bed mobility, bathing, toileting, dressing, and locomotion level  PARTICIPATION LIMITATIONS: meal prep, cleaning, laundry, driving, shopping, community activity, and yard work  [facility] POTENTIAL: Good  CLINICAL DECISION MAKING: Evolving/moderate complexity  EVALUATION COMPLEXITY: Moderate   GOALS: Goals reviewed with patient? No  SHORT TERM GOALS: Target date: 05/11/2024 patient will be independent with initial HEP  Baseline: Goal status: met  2.  Patient will report 50% improvement overall  Baseline:  Goal status: met  3.  Patient will increase right knee mobility to -2 to 90 to promote normal navigation of steps; step over step pattern  Baseline: see above Goal status: met     LONG TERM GOALS: Target date: 1024/2025  Patient will be independent in self management strategies to improve quality of life and functional outcomes.  Baseline:  Goal status:in progress  2.  Patient will report 70% improvement overall  Baseline:  Goal status: in progress   3.  Patient will improve LEFS score by 26 points to demonstrate improved perceived function  Baseline: 05/19/24 38/80 (24 points) Goal status: in progress  4.  Patient will increase right knee mobility to -2 to 120 to promote normal navigation of  steps; step over step pattern  Baseline: -4 to 75; 05/19/24 0 to 115 Goal status: in progress  5.   Patient will increase right leg MMT's to 5/5 to allow navigation of steps without gait deviation or loss of balance  Baseline: see above Goal status: in progress  PLAN:  PT FREQUENCY: 2x/week to 3 times a week  PT DURATION: 6 weeks  PLANNED INTERVENTIONS: 97164- PT Re-evaluation, 97110-Therapeutic exercises, 97530- Therapeutic activity, 97112- Neuromuscular re-education, 97535- Self Care, [id number]- Manual therapy, [id number]- Gait training, [phone]- Orthotic Fit/training, [phone]- Canalith repositioning, [id number]- Aquatic Therapy, 97760- Splinting, 97597- Wound care (first 20 sq cm), 97598- Wound care (each additional 20 sq cm)Patient/Family education, Balance training, Stair training, Taping, Dry Needling, Joint mobilization, Joint manipulation, Spinal manipulation, Spinal mobilization, Scar mobilization, and DME instructions.   PLAN FOR NEXT SESSION:  right knee mobility and strength; gait training.    Add hip stability and balance activities. Decrease to 2 x a week  9:38 AM, 05/22/24 Xian Alves Small Savior Himebaugh MPT [place] [facility] [place] [phone]

## 2024-05-22 NOTE — Addendum Note (Signed)
 Addended byBETHA LEODIS NO S on: 05/22/2024 09:19 AM   Modules accepted: Orders

## 2024-05-24 ENCOUNTER — Encounter (HOSPITAL_COMMUNITY): Payer: Self-pay

## 2024-05-24 ENCOUNTER — Ambulatory Visit (HOSPITAL_COMMUNITY)

## 2024-05-24 DIAGNOSIS — R262 Difficulty in walking, not elsewhere classified: Secondary | ICD-10-CM

## 2024-05-24 DIAGNOSIS — M25661 Stiffness of right knee, not elsewhere classified: Secondary | ICD-10-CM

## 2024-05-24 DIAGNOSIS — M25561 Pain in right knee: Secondary | ICD-10-CM

## 2024-05-24 NOTE — Therapy (Signed)
 OUTPATIENT PHYSICAL THERAPY LOWER EXTREMITY TREATMENT  # OF FEET WALKED: not complete this session, ambulate with SPC through session. ROM:  Flexion: 120 degrees            Extension:0    Patient Name: Stephanie [name] MRN: [id number] DOB:Sep 05, 1950, 73 y.o., female Today's Date: 05/24/2024  END OF SESSION:  PT End of Session - 05/24/24 0854     Visit Number 12    Number of Visits 20    Date for Recertification  06/16/24    Authorization Type [employer]    Authorization Time Period no auth required    Progress Note Due on Visit 10    PT Start Time 0855    PT Stop Time 0940    PT Time Calculation (min) 45 min    Activity Tolerance Patient tolerated treatment well    Behavior During Therapy [facility] for tasks assessed/performed           Past Medical History:  Diagnosis Date   Diabetes mellitus without complication (HCC)    GERD (gastroesophageal reflux disease)    Hypercholesteremia    Hypertension    Hypothyroidism    PONV (postoperative nausea and vomiting)    Sleep apnea    Past Surgical History:  Procedure Laterality Date   ABDOMINAL HYSTERECTOMY     BREAST BIOPSY Left 2018   HYALINIZED FIBROADENOMATOID NODULE WITH CALCIFICATIONS   COLONOSCOPY N/A 12/26/2012   Procedure: COLONOSCOPY;  Surgeon: Lamar CHRISTELLA Hollingshead, MD;  Location: [facility];  Service: Endoscopy;  Laterality: N/A;  9:15 AM   COLONOSCOPY WITH PROPOFOL  N/A 04/26/2023   Procedure: COLONOSCOPY WITH PROPOFOL ;  Surgeon: Hollingshead Lamar CHRISTELLA, MD;  Location: [facility];  Service: Endoscopy;  Laterality: N/A;  930am, asa 3   TOTAL KNEE ARTHROPLASTY Left 03/04/2016   Procedure: LEFT TOTAL KNEE ARTHROPLASTY;  Surgeon: Taft FORBES Minerva, MD;  Location: [facility];  Service: Orthopedics;  Laterality: Left;   TOTAL KNEE ARTHROPLASTY Right 04/25/2024   Procedure: ARTHROPLASTY, KNEE, TOTAL;  Surgeon: Minerva Taft FORBES, MD;  Location: [facility];  Service: Orthopedics;  Laterality: Right;   TUBAL LIGATION      Patient Active Problem List   Diagnosis Date Noted   Osteoarthritis of right knee 04/25/2024   Hypertension, essential 12/16/2022   OSA (obstructive sleep apnea) 03/13/2022   S/P total knee replacement, left 03/04/16    Arthritis of knee, degenerative 02/23/2013   ACHILLES TENDINITIS 06/04/2009   ANKLE PAIN, LEFT 12/01/2007    PCP: Sheryle Carwin, MD  REFERRING PROVIDER: Minerva Taft, MD Next apt: 05/29/24:  REFERRING DIAG: s/p right TKA  THERAPY DIAG:  Acute pain of right knee  Stiffness of right knee, not elsewhere classified  Difficulty in walking, not elsewhere classified  Rationale for Evaluation and Treatment: Rehabilitation  ONSET DATE: 04/25/24  SUBJECTIVE:   SUBJECTIVE STATEMENT: No reports of pain in knee, continues to feel very stiff.  Returns to [place] on 05/29/24.  Eval:Right knee worn out; s/p right knee TKA 04/25/24 per [facility]arrison; discharged home yesterday; has a CPM at home  PERTINENT HISTORY: Left TKA 2017 per Minerva PAIN:  Are you having pain? Yes: NPRS scale: 4/10 Pain location: right knee Pain description: tight Aggravating factors: standing, walking, bending Relieving factors: pain meds; ice  PRECAUTIONS: Fall  RED FLAGS: None   WEIGHT BEARING RESTRICTIONS: No  FALLS:  Has patient fallen in last 6 months? No  LIVING ENVIRONMENT: Lives with: lives with their spouse Lives in: House/apartment Stairs: Yes: Internal:  11 steps; on right going upwasher and dryer downstairs Has following equipment at home: Single point cane, Walker - 2 wheeled, and bed side commode  OCCUPATION: retired  PLOF: Independent  PATIENT GOALS: to walk and bend better  NEXT MD VISIT: PRN  OBJECTIVE:  Note: Objective measures were completed at Evaluation unless otherwise noted.  DIAGNOSTIC FINDINGS:   PATIENT SURVEYS:  LEFS  Extreme difficulty/unable (0), Quite a bit of difficulty (1), Moderate difficulty (2), Little difficulty (3), No difficulty  (4) Survey date:    Any of your usual work, housework or school activities   2. Usual hobbies, recreational or sporting activities   3. Getting into/out of the bath   4. Walking between rooms   5. Putting on socks/shoes   6. Squatting    7. Lifting an object, like a bag of groceries from the floor   8. Performing light activities around your home   9. Performing heavy activities around your home   10. Getting into/out of a car   11. Walking 2 blocks   12. Walking 1 mile   13. Going up/down 10 stairs (1 flight)   14. Standing for 1 hour   15.  sitting for 1 hour   16. Running on even ground   17. Running on uneven ground   18. Making sharp turns while running fast   19. Hopping    20. Rolling over in bed   Score total:  14/80 17.5%     COGNITION: Overall cognitive status: Within functional limits for tasks assessed     SENSATION: WFL  EDEMA:  Normal for this time s/p   POSTURE: rounded shoulders, forward head, and flexed trunk   PALPATION: General soreness normal for this time s/p  LOWER EXTREMITY ROM:  Active ROM Right eval Left eval Right 05/01/24 Right 05/05/24 Right 05/10/24 Right 10/01/225 Right 05/24/24:  Hip flexion         Hip extension         Hip abduction         Hip adduction         Hip internal rotation         Hip external rotation         Knee flexion 75 supine  86 AAROM 101 AAROM 110 AAROM AROM 115, AAROM 117 120  Knee extension -4 0   0 AAROM AROM 0 0  Ankle dorsiflexion         Ankle plantarflexion         Ankle inversion         Ankle eversion          (Blank rows = not tested)  LOWER EXTREMITY MMT:  MMT Right eval Left eval Right 05/19/24  Hip flexion 3-  4  Hip extension     Hip abduction     Hip adduction     Hip internal rotation     Hip external rotation     Knee flexion   4 (sitting)  Knee extension 3-  4+  Ankle dorsiflexion   4+  Ankle plantarflexion     Ankle inversion     Ankle eversion      (Blank rows = not  tested)  FUNCTIONAL TESTS:  5 times sit to stand: 31.71 sec using hands heavily on walker; right foot advanced 2 minute walk test: 145 ft with RW  GAIT: Distance walked: 145 ft Assistive device utilized: Walker - 2 wheeled Level of assistance: SBA Comments: antalgic  gait; decreased gait speed                                                                                                                                TREATMENT DATE:  05/24/24: Bike, seat 8, full revolutions, 5' Standing at // bars: - Squats front of chair 20x - SLS Rt 8 max; Lt 12 - Tandem stance 1x 30 on floor, 2x 30 on foam intermittent HHA - Sidestep with RTB around thigh 3RT - Vector stance 3x 5 - 6in step up and over 10x - Lateral step up 6in 10x Supine: STM and manual ROM to right knee x 10' to decrease stiffness and improve joint extensibility AROM 120 degrees  05/22/24 STM and manual ROM to right knee x 10' to decrease stiffness and improve joint extensibility AAROM right knee 118 today Heel raises on incline 2 x 10 Toe raises on decline 2 x 10 Squats to chair for target 2 x 10 6 box step ups 2 x 10 6 box lateral step ups 2 x 10 Tandem stance 2 x 30 Hip abduction  x 10 each Hip extension  x 10 each Bike seat 8 x 5' full revolutions Updated HEP   05/19/24 Mini progress note for 10th visit STM and manual ROM to right knee for flexion and extension x 10' AAROM right knee 0 to 115 MMT's see above 5 time sit to stand 17.81 sec no UE assist 2 MWT 330 ft with SPC Heel raises 2 x 10 LEFS 38/80; 47.5% Bike seat 8 full revolutions x 5'     PATIENT EDUCATION:  Education details: Patient educated on exam findings, POC, scope of PT, HEP, and what to expect next visit. Person educated: Patient Education method: Explanation, Demonstration, and Handouts Education comprehension: verbalized understanding, returned demonstration, verbal cues required, and tactile cues required  HOME EXERCISE  PROGRAM: Access Code: [email or web address] URL: [email or web address] Date: 05/22/2024 Prepared by: [facility]  Exercises - Seated Heel Toe Raises  - 1 x daily - 7 x weekly - 3 sets - 10 reps - Seated Heel Slide  - 1 x daily - 7 x weekly - 2 sets - 10 reps - Supine Heel Slide  - 1 x daily - 7 x weekly - 2 sets - 10 reps - Supine Knee Extension Strengthening  - 1 x daily - 7 x weekly - 2 sets - 10 reps - Supine Active Straight Leg Raise  - 1 x daily - 7 x weekly - 3 sets - 10 reps - Standing Ankle Plantar Flexion Dorsiflexion with Counter Support  - 1 x daily - 7 x weekly - 2 sets - 10 reps - Mini Squat with Counter Support  - 1 x daily - 7 x weekly - 2 sets - 10 reps - tandem stance balance; try not to hold on  - 1 x daily - 7 x weekly - 1 sets - 3 reps - 30  sec hold - Standing Hip Abduction with Counter Support  - 1 x daily - 7 x weekly - 2 sets - 10 reps - Standing Hip Extension with Counter Support  - 1 x daily - 7 x weekly - 2 sets - 10 reps  05/01/24- Supine Knee Extension Strengthening  - 1 x daily - 7 x weekly - 2 sets - 10 reps Access Code: [email or web address] URL: [email or web address] Date: 04/27/2024 Prepared by: [facility]  Exercises - Seated Heel Toe Raises  - 1 x daily - 7 x weekly - 3 sets - 10 reps - Seated Heel Slide  - 1 x daily - 7 x weekly - 2 sets - 10 reps - Supine Ankle Pumps  - 1 x daily - 7 x weekly - 2 sets - 10 reps - Supine Quad Set  - 1 x daily - 7 x weekly - 2 sets - 10 reps - 5 sec hold - Supine Heel Slide  - 1 x daily - 7 x weekly - 2 sets - 10 reps - Supine Heel Slide with Strap  - 1 x daily - 7 x weekly - 2 sets - 10 reps  ASSESSMENT:  CLINICAL IMPRESSION: Continues session focus with knee mobility and functional strengthening.  Address balance today with increased difficulty SLS.  Added hip stability exercises to assist, did required intermittent HHA during SLS based activities and on dynamic surfaces.  EOS with manual to assist with proximal  knee edema, STM to address musculature restrictions and gentle ROM.  AROM 0-120 degrees.  No reports of pain through session.  Pt to return to [place] on 05/29/24.    Eval: Patient is a 73 y.o. female who was seen today for physical therapy evaluation and treatment for M17.11 (ICD-10-CM) - Primary osteoarthritis of right knee; s/p right TKA 04/25/24. Patient demonstrates muscle weakness, reduced ROM, gait impairments and fascial restrictions which are likely contributing to symptoms of pain and are negatively impacting patient ability to perform ADLs and functional mobility tasks. Patient will benefit from skilled physical therapy services to address these deficits to reduce pain and improve level of function with ADLs and functional mobility tasks.   OBJECTIVE IMPAIRMENTS: Abnormal gait, decreased activity tolerance, difficulty walking, decreased ROM, decreased strength, increased fascial restrictions, impaired perceived functional ability, and pain.   ACTIVITY LIMITATIONS: carrying, lifting, bending, sitting, standing, squatting, sleeping, stairs, transfers, bed mobility, bathing, toileting, dressing, and locomotion level  PARTICIPATION LIMITATIONS: meal prep, cleaning, laundry, driving, shopping, community activity, and yard work  [facility] POTENTIAL: Good  CLINICAL DECISION MAKING: Evolving/moderate complexity  EVALUATION COMPLEXITY: Moderate   GOALS: Goals reviewed with patient? No  SHORT TERM GOALS: Target date: 05/11/2024 patient will be independent with initial HEP  Baseline: Goal status: met  2.  Patient will report 50% improvement overall  Baseline:  Goal status: met  3.  Patient will increase right knee mobility to -2 to 90 to promote normal navigation of steps; step over step pattern  Baseline: see above Goal status: met     LONG TERM GOALS: Target date: 1024/2025  Patient will be independent in self management strategies to improve quality of life and functional  outcomes.  Baseline:  Goal status:in progress  2.  Patient will report 70% improvement overall  Baseline:  Goal status: in progress   3.  Patient will improve LEFS score by 26 points to demonstrate improved perceived function  Baseline: 05/19/24 38/80 (24 points) Goal status: in progress  4.  Patient will increase  right knee mobility to -2 to 120 to promote normal navigation of steps; step over step pattern  Baseline: -4 to 75; 05/19/24 0 to 115 Goal status: in progress  5.   Patient will increase right leg MMT's to 5/5 to allow navigation of steps without gait deviation or loss of balance  Baseline: see above Goal status: in progress  PLAN:  PT FREQUENCY: 2x/week to 3 times a week  PT DURATION: 6 weeks  PLANNED INTERVENTIONS: 97164- PT Re-evaluation, 97110-Therapeutic exercises, 97530- Therapeutic activity, 97112- Neuromuscular re-education, 97535- Self Care, [id number]- Manual therapy, [id number]- Gait training, [phone]- Orthotic Fit/training, [phone]- Canalith repositioning, [id number]- Aquatic Therapy, 97760- Splinting, 97597- Wound care (first 20 sq cm), 97598- Wound care (each additional 20 sq cm)Patient/Family education, Balance training, Stair training, Taping, Dry Needling, Joint mobilization, Joint manipulation, Spinal manipulation, Spinal mobilization, Scar mobilization, and DME instructions.   PLAN FOR NEXT SESSION:  right knee mobility and strength; gait training.    Add hip stability and balance activities. F/U with [facility] apt on 05/29/24.  Add leg press next session.  Augustin Mclean, LPTA/CLT; CBIS [phone]  12:55 PM, 05/24/24

## 2024-05-26 ENCOUNTER — Encounter (HOSPITAL_COMMUNITY)

## 2024-05-29 ENCOUNTER — Ambulatory Visit (INDEPENDENT_AMBULATORY_CARE_PROVIDER_SITE_OTHER): Admitting: Orthopedic Surgery

## 2024-05-29 DIAGNOSIS — Z96651 Presence of right artificial knee joint: Secondary | ICD-10-CM

## 2024-05-29 DIAGNOSIS — M1711 Unilateral primary osteoarthritis, right knee: Secondary | ICD-10-CM

## 2024-05-29 NOTE — Progress Notes (Signed)
     05/29/2024   Chief Complaint  Patient presents with   Routine Post Op    RIGHT TKA DOS 04/25/24    Encounter Diagnoses  Name Primary?   Primary osteoarthritis of right knee Yes   S/P total knee replacement, right 04/25/24     What pharmacy do you use ? _________[facility] [place]_  DOI/DOS/ Date: 04/25/24  Improved stiff and a little sore but I think its getting better  73 year old female status post right total knee arthroplasty she is in her 5th-6th week.  She does complain of some soreness but overall getting better  She has surpassed the 90 degree range of motion knee comes to full extension.  No signs of infection or DVT  She can resume normal dose of aspirin   Continue therapy follow-up in 5 to 6 weeks

## 2024-05-29 NOTE — Progress Notes (Signed)
    05/29/2024   Chief Complaint  Patient presents with   Routine Post Op    RIGHT TKA DOS 04/25/24    No diagnosis found.  What pharmacy do you use ? _________[facility] [place]_  DOI/DOS/ Date: 04/25/24  Improved stiff and a little sore but I think its getting better

## 2024-05-30 DIAGNOSIS — N1832 Chronic kidney disease, stage 3b: Secondary | ICD-10-CM | POA: Diagnosis not present

## 2024-05-30 DIAGNOSIS — Z79899 Other long term (current) drug therapy: Secondary | ICD-10-CM | POA: Diagnosis not present

## 2024-05-30 DIAGNOSIS — I10 Essential (primary) hypertension: Secondary | ICD-10-CM | POA: Diagnosis not present

## 2024-05-30 DIAGNOSIS — E039 Hypothyroidism, unspecified: Secondary | ICD-10-CM | POA: Diagnosis not present

## 2024-05-30 DIAGNOSIS — E1129 Type 2 diabetes mellitus with other diabetic kidney complication: Secondary | ICD-10-CM | POA: Diagnosis not present

## 2024-05-31 ENCOUNTER — Ambulatory Visit (HOSPITAL_COMMUNITY)

## 2024-05-31 DIAGNOSIS — R262 Difficulty in walking, not elsewhere classified: Secondary | ICD-10-CM | POA: Diagnosis not present

## 2024-05-31 DIAGNOSIS — M25561 Pain in right knee: Secondary | ICD-10-CM | POA: Diagnosis not present

## 2024-05-31 DIAGNOSIS — M25661 Stiffness of right knee, not elsewhere classified: Secondary | ICD-10-CM | POA: Diagnosis not present

## 2024-05-31 NOTE — Therapy (Signed)
 OUTPATIENT PHYSICAL THERAPY LOWER EXTREMITY TREATMENT  # OF FEET WALKED: not complete this session, ambulate with SPC through session. ROM:  Flexion: 120 degrees            Extension:0    Patient Name: Stephanie [name] MRN: [id number] DOB:07-10-1951, 73 y.o., female Today's Date: 05/31/2024  END OF SESSION:  PT End of Session - 05/31/24 0812     Visit Number 13    Number of Visits 20    Date for Recertification  06/16/24    Authorization Type [employer]    Authorization Time Period no auth required    Progress Note Due on Visit 10    PT Start Time 0814    PT Stop Time 0854    PT Time Calculation (min) 40 min    Activity Tolerance Patient tolerated treatment well    Behavior During Therapy [facility] for tasks assessed/performed           Past Medical History:  Diagnosis Date   Diabetes mellitus without complication (HCC)    GERD (gastroesophageal reflux disease)    Hypercholesteremia    Hypertension    Hypothyroidism    PONV (postoperative nausea and vomiting)    Sleep apnea    Past Surgical History:  Procedure Laterality Date   ABDOMINAL HYSTERECTOMY     BREAST BIOPSY Left 2018   HYALINIZED FIBROADENOMATOID NODULE WITH CALCIFICATIONS   COLONOSCOPY N/A 12/26/2012   Procedure: COLONOSCOPY;  Surgeon: Lamar CHRISTELLA Hollingshead, MD;  Location: [facility];  Service: Endoscopy;  Laterality: N/A;  9:15 AM   COLONOSCOPY WITH PROPOFOL  N/A 04/26/2023   Procedure: COLONOSCOPY WITH PROPOFOL ;  Surgeon: Hollingshead Lamar CHRISTELLA, MD;  Location: [facility];  Service: Endoscopy;  Laterality: N/A;  930am, asa 3   TOTAL KNEE ARTHROPLASTY Left 03/04/2016   Procedure: LEFT TOTAL KNEE ARTHROPLASTY;  Surgeon: Taft FORBES Minerva, MD;  Location: [facility];  Service: Orthopedics;  Laterality: Left;   TOTAL KNEE ARTHROPLASTY Right 04/25/2024   Procedure: ARTHROPLASTY, KNEE, TOTAL;  Surgeon: Minerva Taft FORBES, MD;  Location: [facility];  Service: Orthopedics;  Laterality: Right;   TUBAL LIGATION      Patient Active Problem List   Diagnosis Date Noted   Osteoarthritis of right knee 04/25/2024   Hypertension, essential 12/16/2022   OSA (obstructive sleep apnea) 03/13/2022   S/P total knee replacement, left 03/04/16    Arthritis of knee, degenerative 02/23/2013   ACHILLES TENDINITIS 06/04/2009   ANKLE PAIN, LEFT 12/01/2007    PCP: Sheryle Carwin, MD  REFERRING PROVIDER: Minerva Taft, MD Next apt: 05/29/24:  REFERRING DIAG: s/p right TKA  THERAPY DIAG:  Acute pain of right knee  Stiffness of right knee, not elsewhere classified  Difficulty in walking, not elsewhere classified  Rationale for Evaluation and Treatment: Rehabilitation  ONSET DATE: 04/25/24  SUBJECTIVE:   SUBJECTIVE STATEMENT: Saw MD on Monday; he is pleased with her progress; she reports less stiffness today; no new complaints  Eval:Right knee worn out; s/p right knee TKA 04/25/24 per harrison; discharged home yesterday; has a CPM at home  PERTINENT HISTORY: Left TKA 2017 per Minerva PAIN:  Are you having pain? Yes: NPRS scale: 4/10 Pain location: right knee Pain description: tight Aggravating factors: standing, walking, bending Relieving factors: pain meds; ice  PRECAUTIONS: Fall  RED FLAGS: None   WEIGHT BEARING RESTRICTIONS: No  FALLS:  Has patient fallen in last 6 months? No  LIVING ENVIRONMENT: Lives with: lives with their spouse Lives in: House/apartment Stairs: Yes:  Internal: 11 steps; on right going upwasher and dryer downstairs Has following equipment at home: Single point cane, Walker - 2 wheeled, and bed side commode  OCCUPATION: retired  PLOF: Independent  PATIENT GOALS: to walk and bend better  NEXT MD VISIT: PRN  OBJECTIVE:  Note: Objective measures were completed at Evaluation unless otherwise noted.  DIAGNOSTIC FINDINGS:   PATIENT SURVEYS:  LEFS  Extreme difficulty/unable (0), Quite a bit of difficulty (1), Moderate difficulty (2), Little difficulty (3), No  difficulty (4) Survey date:    Any of your usual work, housework or school activities   2. Usual hobbies, recreational or sporting activities   3. Getting into/out of the bath   4. Walking between rooms   5. Putting on socks/shoes   6. Squatting    7. Lifting an object, like a bag of groceries from the floor   8. Performing light activities around your home   9. Performing heavy activities around your home   10. Getting into/out of a car   11. Walking 2 blocks   12. Walking 1 mile   13. Going up/down 10 stairs (1 flight)   14. Standing for 1 hour   15.  sitting for 1 hour   16. Running on even ground   17. Running on uneven ground   18. Making sharp turns while running fast   19. Hopping    20. Rolling over in bed   Score total:  14/80 17.5%     COGNITION: Overall cognitive status: Within functional limits for tasks assessed     SENSATION: WFL  EDEMA:  Normal for this time s/p   POSTURE: rounded shoulders, forward head, and flexed trunk   PALPATION: General soreness normal for this time s/p  LOWER EXTREMITY ROM:  Active ROM Right eval Left eval Right 05/01/24 Right 05/05/24 Right 05/10/24 Right 10/01/225 Right 05/24/24:  Hip flexion         Hip extension         Hip abduction         Hip adduction         Hip internal rotation         Hip external rotation         Knee flexion 75 supine  86 AAROM 101 AAROM 110 AAROM AROM 115, AAROM 117 120  Knee extension -4 0   0 AAROM AROM 0 0  Ankle dorsiflexion         Ankle plantarflexion         Ankle inversion         Ankle eversion          (Blank rows = not tested)  LOWER EXTREMITY MMT:  MMT Right eval Left eval Right 05/19/24  Hip flexion 3-  4  Hip extension     Hip abduction     Hip adduction     Hip internal rotation     Hip external rotation     Knee flexion   4 (sitting)  Knee extension 3-  4+  Ankle dorsiflexion   4+  Ankle plantarflexion     Ankle inversion     Ankle eversion      (Blank  rows = not tested)  FUNCTIONAL TESTS:  5 times sit to stand: 31.71 sec using hands heavily on walker; right foot advanced 2 minute walk test: 145 ft with RW  GAIT: Distance walked: 145 ft Assistive device utilized: Walker - 2 wheeled Level of assistance: SBA Comments:  antalgic gait; decreased gait speed                                                                                                                                TREATMENT DATE:  05/31/24 Bike seat 8 x 5' dynamic warm up Heel raises on incline x 20 no UE assist Toe raises on decline x 20 no UE assist 6 box Step ups 2 x 10 6 box lateral step ups 2 x 10 12 box right knee flexion x 2' Walk 1 lap around PT gym without AD then use of mirror for visual cues to correct trunk sway 2# hip abduction 2 x 10 2# hip extension 2 x 10  Squats 2 x 10 Step navigation x 3 reps up and down 7 step BOSU ball alternating lunges 2 x 10 no UE assist Tandem stance x 30 each minimal use of hands       05/24/24: Bike, seat 8, full revolutions, 5' Standing at // bars: - Squats front of chair 20x - SLS Rt 8 max; Lt 12 - Tandem stance 1x 30 on floor, 2x 30 on foam intermittent HHA - Sidestep with RTB around thigh 3RT - Vector stance 3x 5 - 6in step up and over 10x - Lateral step up 6in 10x Supine: STM and manual ROM to right knee x 10' to decrease stiffness and improve joint extensibility AROM 120 degrees  05/22/24 STM and manual ROM to right knee x 10' to decrease stiffness and improve joint extensibility AAROM right knee 118 today Heel raises on incline 2 x 10 Toe raises on decline 2 x 10 Squats to chair for target 2 x 10 6 box step ups 2 x 10 6 box lateral step ups 2 x 10 Tandem stance 2 x 30 Hip abduction  x 10 each Hip extension  x 10 each Bike seat 8 x 5' full revolutions Updated HEP   05/19/24 Mini progress note for 10th visit STM and manual ROM to right knee for flexion and extension x 10' AAROM  right knee 0 to 115 MMT's see above 5 time sit to stand 17.81 sec no UE assist 2 MWT 330 ft with SPC Heel raises 2 x 10 LEFS 38/80; 47.5% Bike seat 8 full revolutions x 5'     PATIENT EDUCATION:  Education details: Patient educated on exam findings, POC, scope of PT, HEP, and what to expect next visit. Person educated: Patient Education method: Explanation, Demonstration, and Handouts Education comprehension: verbalized understanding, returned demonstration, verbal cues required, and tactile cues required  HOME EXERCISE PROGRAM: Access Code: [email or web address] URL: [email or web address] Date: 05/22/2024 Prepared by: [facility]  Exercises - Seated Heel Toe Raises  - 1 x daily - 7 x weekly - 3 sets - 10 reps - Seated Heel Slide  - 1 x daily - 7 x weekly - 2 sets - 10 reps - Supine Heel Slide  - 1 x daily -  7 x weekly - 2 sets - 10 reps - Supine Knee Extension Strengthening  - 1 x daily - 7 x weekly - 2 sets - 10 reps - Supine Active Straight Leg Raise  - 1 x daily - 7 x weekly - 3 sets - 10 reps - Standing Ankle Plantar Flexion Dorsiflexion with Counter Support  - 1 x daily - 7 x weekly - 2 sets - 10 reps - Mini Squat with Counter Support  - 1 x daily - 7 x weekly - 2 sets - 10 reps - tandem stance balance; try not to hold on  - 1 x daily - 7 x weekly - 1 sets - 3 reps - 30 sec hold - Standing Hip Abduction with Counter Support  - 1 x daily - 7 x weekly - 2 sets - 10 reps - Standing Hip Extension with Counter Support  - 1 x daily - 7 x weekly - 2 sets - 10 reps  05/01/24- Supine Knee Extension Strengthening  - 1 x daily - 7 x weekly - 2 sets - 10 reps Access Code: [email or web address] URL: [email or web address] Date: 04/27/2024 Prepared by: [facility]  Exercises - Seated Heel Toe Raises  - 1 x daily - 7 x weekly - 3 sets - 10 reps - Seated Heel Slide  - 1 x daily - 7 x weekly - 2 sets - 10 reps - Supine Ankle Pumps  - 1 x daily - 7 x weekly - 2 sets - 10 reps - Supine  Quad Set  - 1 x daily - 7 x weekly - 2 sets - 10 reps - 5 sec hold - Supine Heel Slide  - 1 x daily - 7 x weekly - 2 sets - 10 reps - Supine Heel Slide with Strap  - 1 x daily - 7 x weekly - 2 sets - 10 reps  ASSESSMENT:  CLINICAL IMPRESSION: Patient arrives with cane. Continued session focus on right knee strengthening and mobility. Needs less breaks today.  Ambulation x 1 lap around PT gym without AD and patient with noted right trunk sway so used mirror for visual feedback to assist patient in correcting.  Noted improvement with visual and verbal cues. Able to perform last 2 reps of step navigation with reciprocal pattern using 1 handrail.   Patient will benefit from continued skilled therapy services to address deficits and promote return to optimal function.       Eval: Patient is a 73 y.o. female who was seen today for physical therapy evaluation and treatment for M17.11 (ICD-10-CM) - Primary osteoarthritis of right knee; s/p right TKA 04/25/24. Patient demonstrates muscle weakness, reduced ROM, gait impairments and fascial restrictions which are likely contributing to symptoms of pain and are negatively impacting patient ability to perform ADLs and functional mobility tasks. Patient will benefit from skilled physical therapy services to address these deficits to reduce pain and improve level of function with ADLs and functional mobility tasks.   OBJECTIVE IMPAIRMENTS: Abnormal gait, decreased activity tolerance, difficulty walking, decreased ROM, decreased strength, increased fascial restrictions, impaired perceived functional ability, and pain.   ACTIVITY LIMITATIONS: carrying, lifting, bending, sitting, standing, squatting, sleeping, stairs, transfers, bed mobility, bathing, toileting, dressing, and locomotion level  PARTICIPATION LIMITATIONS: meal prep, cleaning, laundry, driving, shopping, community activity, and yard work  [facility] POTENTIAL: Good  CLINICAL DECISION MAKING:  Evolving/moderate complexity  EVALUATION COMPLEXITY: Moderate   GOALS: Goals reviewed with patient? No  SHORT TERM GOALS: Target date: 05/11/2024 patient  will be independent with initial HEP  Baseline: Goal status: met  2.  Patient will report 50% improvement overall  Baseline:  Goal status: met  3.  Patient will increase right knee mobility to -2 to 90 to promote normal navigation of steps; step over step pattern  Baseline: see above Goal status: met     LONG TERM GOALS: Target date: 1024/2025  Patient will be independent in self management strategies to improve quality of life and functional outcomes.  Baseline:  Goal status:in progress  2.  Patient will report 70% improvement overall  Baseline:  Goal status: in progress   3.  Patient will improve LEFS score by 26 points to demonstrate improved perceived function  Baseline: 05/19/24 38/80 (24 points) Goal status: in progress  4.  Patient will increase right knee mobility to -2 to 120 to promote normal navigation of steps; step over step pattern  Baseline: -4 to 75; 05/19/24 0 to 115 Goal status: in progress  5.   Patient will increase right leg MMT's to 5/5 to allow navigation of steps without gait deviation or loss of balance  Baseline: see above Goal status: in progress  PLAN:  PT FREQUENCY: 2x/week to 3 times a week  PT DURATION: 6 weeks  PLANNED INTERVENTIONS: 97164- PT Re-evaluation, 97110-Therapeutic exercises, 97530- Therapeutic activity, 97112- Neuromuscular re-education, 97535- Self Care, [id number]- Manual therapy, [id number]- Gait training, [phone]- Orthotic Fit/training, [phone]- Canalith repositioning, [id number]- Aquatic Therapy, 97760- Splinting, 97597- Wound care (first 20 sq cm), 97598- Wound care (each additional 20 sq cm)Patient/Family education, Balance training, Stair training, Taping, Dry Needling, Joint mobilization, Joint manipulation, Spinal manipulation, Spinal mobilization, Scar mobilization, and DME  instructions.   PLAN FOR NEXT SESSION:  right knee mobility and strength; gait training.    Add hip stability and balance activities. .  Add leg press next session.  8:54 AM, 05/31/24 Esaul Dorwart Small Maxximus Gotay MPT [facility] [place] [phone]

## 2024-06-06 DIAGNOSIS — Z23 Encounter for immunization: Secondary | ICD-10-CM | POA: Diagnosis not present

## 2024-06-06 DIAGNOSIS — E1129 Type 2 diabetes mellitus with other diabetic kidney complication: Secondary | ICD-10-CM | POA: Diagnosis not present

## 2024-06-06 DIAGNOSIS — N1832 Chronic kidney disease, stage 3b: Secondary | ICD-10-CM | POA: Diagnosis not present

## 2024-06-06 DIAGNOSIS — N183 Chronic kidney disease, stage 3 unspecified: Secondary | ICD-10-CM | POA: Diagnosis not present

## 2024-06-06 DIAGNOSIS — I10 Essential (primary) hypertension: Secondary | ICD-10-CM | POA: Diagnosis not present

## 2024-06-06 DIAGNOSIS — Z79899 Other long term (current) drug therapy: Secondary | ICD-10-CM | POA: Diagnosis not present

## 2024-06-09 ENCOUNTER — Encounter (HOSPITAL_COMMUNITY): Payer: Self-pay

## 2024-06-09 ENCOUNTER — Ambulatory Visit (HOSPITAL_COMMUNITY)

## 2024-06-09 DIAGNOSIS — R262 Difficulty in walking, not elsewhere classified: Secondary | ICD-10-CM

## 2024-06-09 DIAGNOSIS — M25661 Stiffness of right knee, not elsewhere classified: Secondary | ICD-10-CM

## 2024-06-09 DIAGNOSIS — M25561 Pain in right knee: Secondary | ICD-10-CM

## 2024-06-09 NOTE — Therapy (Signed)
 OUTPATIENT PHYSICAL THERAPY LOWER EXTREMITY TREATMENT  Patient Name: Stephanie [name] MRN: [id number] DOB:1951/08/12, 73 y.o., female Today's Date: 06/09/2024  END OF SESSION:  PT End of Session - 06/09/24 0855     Visit Number 14    Number of Visits 20    Date for Recertification  06/16/24    Authorization Type [employer]    Authorization Time Period no auth required    Progress Note Due on Visit 20   Progress note complete visit #10   PT Start Time 0858    PT Stop Time 0939    PT Time Calculation (min) 41 min    Activity Tolerance Patient tolerated treatment well    Behavior During Therapy [facility] for tasks assessed/performed           Past Medical History:  Diagnosis Date   Diabetes mellitus without complication (HCC)    GERD (gastroesophageal reflux disease)    Hypercholesteremia    Hypertension    Hypothyroidism    PONV (postoperative nausea and vomiting)    Sleep apnea    Past Surgical History:  Procedure Laterality Date   ABDOMINAL HYSTERECTOMY     BREAST BIOPSY Left 2018   HYALINIZED FIBROADENOMATOID NODULE WITH CALCIFICATIONS   COLONOSCOPY N/A 12/26/2012   Procedure: COLONOSCOPY;  Surgeon: Lamar CHRISTELLA Hollingshead, MD;  Location: [facility];  Service: Endoscopy;  Laterality: N/A;  9:15 AM   COLONOSCOPY WITH PROPOFOL  N/A 04/26/2023   Procedure: COLONOSCOPY WITH PROPOFOL ;  Surgeon: Hollingshead Lamar CHRISTELLA, MD;  Location: [facility];  Service: Endoscopy;  Laterality: N/A;  930am, asa 3   TOTAL KNEE ARTHROPLASTY Left 03/04/2016   Procedure: LEFT TOTAL KNEE ARTHROPLASTY;  Surgeon: Taft FORBES Minerva, MD;  Location: [facility];  Service: Orthopedics;  Laterality: Left;   TOTAL KNEE ARTHROPLASTY Right 04/25/2024   Procedure: ARTHROPLASTY, KNEE, TOTAL;  Surgeon: Minerva Taft FORBES, MD;  Location: [facility];  Service: Orthopedics;  Laterality: Right;   TUBAL LIGATION     Patient Active Problem List   Diagnosis Date Noted   Osteoarthritis of right knee 04/25/2024    Hypertension, essential 12/16/2022   OSA (obstructive sleep apnea) 03/13/2022   S/P total knee replacement, left 03/04/16    Arthritis of knee, degenerative 02/23/2013   ACHILLES TENDINITIS 06/04/2009   ANKLE PAIN, LEFT 12/01/2007    PCP: Sheryle Carwin, MD  REFERRING PROVIDER: Minerva Taft, MD Next apt: 05/29/24:  REFERRING DIAG: s/p right TKA  THERAPY DIAG:  Acute pain of right knee  Stiffness of right knee, not elsewhere classified  Difficulty in walking, not elsewhere classified  Rationale for Evaluation and Treatment: Rehabilitation  ONSET DATE: 04/25/24  SUBJECTIVE:   SUBJECTIVE STATEMENT: Knee is feeling good today, just a little stiffness today.  No reports of pain or new issues.  Pt arrived without cane, stated she has been walking in home without.  No reports of recent fall.  Eval:Right knee worn out; s/p right knee TKA 04/25/24 per harrison; discharged home yesterday; has a CPM at home  PERTINENT HISTORY: Left TKA 2017 per Minerva PAIN:  Are you having pain? Yes: NPRS scale: 4/10 Pain location: right knee Pain description: tight Aggravating factors: standing, walking, bending Relieving factors: pain meds; ice  PRECAUTIONS: Fall  RED FLAGS: None   WEIGHT BEARING RESTRICTIONS: No  FALLS:  Has patient fallen in last 6 months? No  LIVING ENVIRONMENT: Lives with: lives with their spouse Lives in: House/apartment Stairs: Yes: Internal: 11 steps; on right going upwasher and dryer  downstairs Has following equipment at home: Single point cane, Walker - 2 wheeled, and bed side commode  OCCUPATION: retired  PLOF: Independent  PATIENT GOALS: to walk and bend better  NEXT MD VISIT: 07/03/24  OBJECTIVE:  Note: Objective measures were completed at Evaluation unless otherwise noted.  DIAGNOSTIC FINDINGS:   PATIENT SURVEYS:  LEFS  Extreme difficulty/unable (0), Quite a bit of difficulty (1), Moderate difficulty (2), Little difficulty (3), No  difficulty (4) Survey date:    Any of your usual work, housework or school activities   2. Usual hobbies, recreational or sporting activities   3. Getting into/out of the bath   4. Walking between rooms   5. Putting on socks/shoes   6. Squatting    7. Lifting an object, like a bag of groceries from the floor   8. Performing light activities around your home   9. Performing heavy activities around your home   10. Getting into/out of a car   11. Walking 2 blocks   12. Walking 1 mile   13. Going up/down 10 stairs (1 flight)   14. Standing for 1 hour   15.  sitting for 1 hour   16. Running on even ground   17. Running on uneven ground   18. Making sharp turns while running fast   19. Hopping    20. Rolling over in bed   Score total:  14/80 17.5%     COGNITION: Overall cognitive status: Within functional limits for tasks assessed     SENSATION: WFL  EDEMA:  Normal for this time s/p   POSTURE: rounded shoulders, forward head, and flexed trunk   PALPATION: General soreness normal for this time s/p  LOWER EXTREMITY ROM:  Active ROM Right eval Left eval Right 05/01/24 Right 05/05/24 Right 05/10/24 Right 10/01/225 Right 05/24/24:  Hip flexion         Hip extension         Hip abduction         Hip adduction         Hip internal rotation         Hip external rotation         Knee flexion 75 supine  86 AAROM 101 AAROM 110 AAROM AROM 115, AAROM 117 120  Knee extension -4 0   0 AAROM AROM 0 0  Ankle dorsiflexion         Ankle plantarflexion         Ankle inversion         Ankle eversion          (Blank rows = not tested)  LOWER EXTREMITY MMT:  MMT Right eval Left eval Right 05/19/24  Hip flexion 3-  4  Hip extension     Hip abduction     Hip adduction     Hip internal rotation     Hip external rotation     Knee flexion   4 (sitting)  Knee extension 3-  4+  Ankle dorsiflexion   4+  Ankle plantarflexion     Ankle inversion     Ankle eversion      (Blank  rows = not tested)  FUNCTIONAL TESTS:  5 times sit to stand: 31.71 sec using hands heavily on walker; right foot advanced 2 minute walk test: 145 ft with RW  GAIT: Distance walked: 145 ft Assistive device utilized: Walker - 2 wheeled Level of assistance: SBA Comments: antalgic gait; decreased gait speed  TREATMENT DATE:  06/09/24: Bike seat 8 x 5' dynamic warm up L3 resistance Vector stance 3x 5 1 HHA Rebounder with red weighted ball and trampoline 10x 3 set with foam under each foot Stair 7in reciprocal pattern 1 HR 5RT Tandem stance on foam 2x 30 no HHA required Squats 2x 10 front of chair for mechanics Knee drive 87pw step height 89k 10 Leg press 4Pl 2x 10 Gait 139ft with improve mechanics, minimal trunk sway Slant board 3x 30  05/31/24 Bike seat 8 x 5' dynamic warm up Heel raises on incline x 20 no UE assist Toe raises on decline x 20 no UE assist 6 box Step ups 2 x 10 6 box lateral step ups 2 x 10 12 box right knee flexion x 2' Walk 1 lap around PT gym without AD then use of mirror for visual cues to correct trunk sway 2# hip abduction 2 x 10 2# hip extension 2 x 10  Squats 2 x 10 Step navigation x 3 reps up and down 7 step BOSU ball alternating lunges 2 x 10 no UE assist Tandem stance x 30 each minimal use of hands       05/24/24: Bike, seat 8, full revolutions, 5' Standing at // bars: - Squats front of chair 20x - SLS Rt 8 max; Lt 12 - Tandem stance 1x 30 on floor, 2x 30 on foam intermittent HHA - Sidestep with RTB around thigh 3RT - Vector stance 3x 5 - 6in step up and over 10x - Lateral step up 6in 10x Supine: STM and manual ROM to right knee x 10' to decrease stiffness and improve joint extensibility AROM 120 degrees  05/22/24 STM and manual ROM to right knee x 10' to decrease stiffness and improve joint  extensibility AAROM right knee 118 today Heel raises on incline 2 x 10 Toe raises on decline 2 x 10 Squats to chair for target 2 x 10 6 box step ups 2 x 10 6 box lateral step ups 2 x 10 Tandem stance 2 x 30 Hip abduction  x 10 each Hip extension  x 10 each Bike seat 8 x 5' full revolutions Updated HEP   PATIENT EDUCATION:  Education details: Patient educated on exam findings, POC, scope of PT, HEP, and what to expect next visit. Person educated: Patient Education method: Explanation, Demonstration, and Handouts Education comprehension: verbalized understanding, returned demonstration, verbal cues required, and tactile cues required  HOME EXERCISE PROGRAM: Access Code: [email or web address] URL: [email or web address] Date: 05/22/2024 Prepared by: [facility]  Exercises - Seated Heel Toe Raises  - 1 x daily - 7 x weekly - 3 sets - 10 reps - Seated Heel Slide  - 1 x daily - 7 x weekly - 2 sets - 10 reps - Supine Heel Slide  - 1 x daily - 7 x weekly - 2 sets - 10 reps - Supine Knee Extension Strengthening  - 1 x daily - 7 x weekly - 2 sets - 10 reps - Supine Active Straight Leg Raise  - 1 x daily - 7 x weekly - 3 sets - 10 reps - Standing Ankle Plantar Flexion Dorsiflexion with Counter Support  - 1 x daily - 7 x weekly - 2 sets - 10 reps - Mini Squat with Counter Support  - 1 x daily - 7 x weekly - 2 sets - 10 reps - tandem stance balance; try not to hold on  - 1 x daily - 7 x weekly - 1  sets - 3 reps - 30 sec hold - Standing Hip Abduction with Counter Support  - 1 x daily - 7 x weekly - 2 sets - 10 reps - Standing Hip Extension with Counter Support  - 1 x daily - 7 x weekly - 2 sets - 10 reps  05/01/24- Supine Knee Extension Strengthening  - 1 x daily - 7 x weekly - 2 sets - 10 reps Access Code: [email or web address] URL: [email or web address] Date: 04/27/2024 Prepared by: [facility]  Exercises - Seated Heel Toe Raises  - 1 x daily - 7 x weekly - 3 sets - 10 reps -  Seated Heel Slide  - 1 x daily - 7 x weekly - 2 sets - 10 reps - Supine Ankle Pumps  - 1 x daily - 7 x weekly - 2 sets - 10 reps - Supine Quad Set  - 1 x daily - 7 x weekly - 2 sets - 10 reps - 5 sec hold - Supine Heel Slide  - 1 x daily - 7 x weekly - 2 sets - 10 reps - Supine Heel Slide with Strap  - 1 x daily - 7 x weekly - 2 sets - 10 reps  06/09/24:  - Side Stepping with Resistance at Thighs and Counter Support  - 2 x daily - 7 x weekly - 1 sets - 10 reps - Single Leg Stance  - 2 x daily - 7 x weekly - 1 sets - 3 reps - 30 hold  ASSESSMENT:  CLINICAL IMPRESSION: Session focus with knee mobility, functional strengthening and balance training.  Pt arrived without SPC and presents with improved gait mechanics, minimal trunk flexion during gait.  Able to progress with dynamic surfaces, resistance walking and dynamic movements with balance activities with minimal to no HHA required.  Pt tolerated well to session with no reports of pain through session.  Reviewed current exercise program with additional sidestep with GTB and SLS.     Eval: Patient is a 73 y.o. female who was seen today for physical therapy evaluation and treatment for M17.11 (ICD-10-CM) - Primary osteoarthritis of right knee; s/p right TKA 04/25/24. Patient demonstrates muscle weakness, reduced ROM, gait impairments and fascial restrictions which are likely contributing to symptoms of pain and are negatively impacting patient ability to perform ADLs and functional mobility tasks. Patient will benefit from skilled physical therapy services to address these deficits to reduce pain and improve level of function with ADLs and functional mobility tasks.   OBJECTIVE IMPAIRMENTS: Abnormal gait, decreased activity tolerance, difficulty walking, decreased ROM, decreased strength, increased fascial restrictions, impaired perceived functional ability, and pain.   ACTIVITY LIMITATIONS: carrying, lifting, bending, sitting, standing,  squatting, sleeping, stairs, transfers, bed mobility, bathing, toileting, dressing, and locomotion level  PARTICIPATION LIMITATIONS: meal prep, cleaning, laundry, driving, shopping, community activity, and yard work  [facility] POTENTIAL: Good  CLINICAL DECISION MAKING: Evolving/moderate complexity  EVALUATION COMPLEXITY: Moderate   GOALS: Goals reviewed with patient? No  SHORT TERM GOALS: Target date: 05/11/2024 patient will be independent with initial HEP  Baseline: Goal status: met  2.  Patient will report 50% improvement overall  Baseline:  Goal status: met  3.  Patient will increase right knee mobility to -2 to 90 to promote normal navigation of steps; step over step pattern  Baseline: see above Goal status: met     LONG TERM GOALS: Target date: 1024/2025  Patient will be independent in self management strategies to improve quality of life and functional  outcomes.  Baseline:  Goal status:in progress  2.  Patient will report 70% improvement overall  Baseline:  Goal status: in progress   3.  Patient will improve LEFS score by 26 points to demonstrate improved perceived function  Baseline: 05/19/24 38/80 (24 points) Goal status: in progress  4.  Patient will increase right knee mobility to -2 to 120 to promote normal navigation of steps; step over step pattern  Baseline: -4 to 75; 05/19/24 0 to 115 Goal status: in progress  5.   Patient will increase right leg MMT's to 5/5 to allow navigation of steps without gait deviation or loss of balance  Baseline: see above Goal status: in progress  PLAN:  PT FREQUENCY: 2x/week to 3 times a week  PT DURATION: 6 weeks  PLANNED INTERVENTIONS: 97164- PT Re-evaluation, 97110-Therapeutic exercises, 97530- Therapeutic activity, 97112- Neuromuscular re-education, 97535- Self Care, [id number]- Manual therapy, [id number]- Gait training, [phone]- Orthotic Fit/training, [phone]- Canalith repositioning, [id number]- Aquatic Therapy, 97760- Splinting,  97597- Wound care (first 20 sq cm), 97598- Wound care (each additional 20 sq cm)Patient/Family education, Balance training, Stair training, Taping, Dry Needling, Joint mobilization, Joint manipulation, Spinal manipulation, Spinal mobilization, Scar mobilization, and DME instructions.   PLAN FOR NEXT SESSION:  right knee mobility and strength; gait training.    Add hip stability and balance activities.  Augustin Mclean, LPTA/CLT; CBIS [phone]  10:52 AM, 06/09/24

## 2024-06-14 ENCOUNTER — Ambulatory Visit (HOSPITAL_COMMUNITY)

## 2024-06-14 DIAGNOSIS — M25661 Stiffness of right knee, not elsewhere classified: Secondary | ICD-10-CM

## 2024-06-14 DIAGNOSIS — M25561 Pain in right knee: Secondary | ICD-10-CM

## 2024-06-14 DIAGNOSIS — R262 Difficulty in walking, not elsewhere classified: Secondary | ICD-10-CM

## 2024-06-14 NOTE — Therapy (Signed)
 OUTPATIENT PHYSICAL THERAPY LOWER EXTREMITY TREATMENT  Patient Name: GENENE KILMAN MRN: [id number] DOB:October 01, 1950, 73 y.o., female Today's Date: 06/14/2024  END OF SESSION:  PT End of Session - 06/14/24 1026     Visit Number 15    Number of Visits 20    Date for Recertification  06/16/24    Authorization Type [employer]    Authorization Time Period no auth required    Progress Note Due on Visit 20   Progress note complete visit #10   PT Start Time 1026    PT Stop Time 1106    PT Time Calculation (min) 40 min    Activity Tolerance Patient tolerated treatment well    Behavior During Therapy WFL for tasks assessed/performed           Past Medical History:  Diagnosis Date   Diabetes mellitus without complication (HCC)    GERD (gastroesophageal reflux disease)    Hypercholesteremia    Hypertension    Hypothyroidism    PONV (postoperative nausea and vomiting)    Sleep apnea    Past Surgical History:  Procedure Laterality Date   ABDOMINAL HYSTERECTOMY     BREAST BIOPSY Left 2018   HYALINIZED FIBROADENOMATOID NODULE WITH CALCIFICATIONS   COLONOSCOPY N/A 12/26/2012   Procedure: COLONOSCOPY;  Surgeon: Lamar CHRISTELLA Hollingshead, MD;  Location: [facility];  Service: Endoscopy;  Laterality: N/A;  9:15 AM   COLONOSCOPY WITH PROPOFOL  N/A 04/26/2023   Procedure: COLONOSCOPY WITH PROPOFOL ;  Surgeon: Hollingshead Lamar CHRISTELLA, MD;  Location: [facility];  Service: Endoscopy;  Laterality: N/A;  930am, asa 3   TOTAL KNEE ARTHROPLASTY Left 03/04/2016   Procedure: LEFT TOTAL KNEE ARTHROPLASTY;  Surgeon: Taft FORBES Minerva, MD;  Location: [facility];  Service: Orthopedics;  Laterality: Left;   TOTAL KNEE ARTHROPLASTY Right 04/25/2024   Procedure: ARTHROPLASTY, KNEE, TOTAL;  Surgeon: Minerva Taft FORBES, MD;  Location: [facility];  Service: Orthopedics;  Laterality: Right;   TUBAL LIGATION     Patient Active Problem List   Diagnosis Date Noted   Osteoarthritis of right knee 04/25/2024    Hypertension, essential 12/16/2022   OSA (obstructive sleep apnea) 03/13/2022   S/P total knee replacement, left 03/04/16    Arthritis of knee, degenerative 02/23/2013   ACHILLES TENDINITIS 06/04/2009   ANKLE PAIN, LEFT 12/01/2007    PCP: Sheryle Carwin, MD  REFERRING PROVIDER: Minerva Taft, MD Next apt: 05/29/24:  REFERRING DIAG: s/p right TKA  THERAPY DIAG:  Acute pain of right knee  Stiffness of right knee, not elsewhere classified  Difficulty in walking, not elsewhere classified  Rationale for Evaluation and Treatment: Rehabilitation  ONSET DATE: 04/25/24  SUBJECTIVE:   SUBJECTIVE STATEMENT: A little stiff this morning due to cooler weather; no pain; has resumed all her normal activities at home except carrying her clothes basket down the steps to the laundry in the basement.    Eval:Right knee worn out; s/p right knee TKA 04/25/24 per harrison; discharged home yesterday; has a CPM at home  PERTINENT HISTORY: Left TKA 2017 per Minerva PAIN:  Are you having pain? Yes: NPRS scale: 4/10 Pain location: right knee Pain description: tight Aggravating factors: standing, walking, bending Relieving factors: pain meds; ice  PRECAUTIONS: Fall  RED FLAGS: None   WEIGHT BEARING RESTRICTIONS: No  FALLS:  Has patient fallen in last 6 months? No  LIVING ENVIRONMENT: Lives with: lives with their spouse Lives in: House/apartment Stairs: Yes: Internal: 11 steps; on right going upwasher and dryer downstairs Has  following equipment at home: Single point cane, Walker - 2 wheeled, and bed side commode  OCCUPATION: retired  PLOF: Independent  PATIENT GOALS: to walk and bend better  NEXT MD VISIT: 07/03/24  OBJECTIVE:  Note: Objective measures were completed at Evaluation unless otherwise noted.  DIAGNOSTIC FINDINGS:   PATIENT SURVEYS:  LEFS  Extreme difficulty/unable (0), Quite a bit of difficulty (1), Moderate difficulty (2), Little difficulty (3), No difficulty  (4) Survey date:    Any of your usual work, housework or school activities   2. Usual hobbies, recreational or sporting activities   3. Getting into/out of the bath   4. Walking between rooms   5. Putting on socks/shoes   6. Squatting    7. Lifting an object, like a bag of groceries from the floor   8. Performing light activities around your home   9. Performing heavy activities around your home   10. Getting into/out of a car   11. Walking 2 blocks   12. Walking 1 mile   13. Going up/down 10 stairs (1 flight)   14. Standing for 1 hour   15.  sitting for 1 hour   16. Running on even ground   17. Running on uneven ground   18. Making sharp turns while running fast   19. Hopping    20. Rolling over in bed   Score total:  14/80 17.5%     COGNITION: Overall cognitive status: Within functional limits for tasks assessed     SENSATION: WFL  EDEMA:  Normal for this time s/p   POSTURE: rounded shoulders, forward head, and flexed trunk   PALPATION: General soreness normal for this time s/p  LOWER EXTREMITY ROM:  Active ROM Right eval Left eval Right 05/01/24 Right 05/05/24 Right 05/10/24 Right 10/01/225 Right 05/24/24:  Hip flexion         Hip extension         Hip abduction         Hip adduction         Hip internal rotation         Hip external rotation         Knee flexion 75 supine  86 AAROM 101 AAROM 110 AAROM AROM 115, AAROM 117 120  Knee extension -4 0   0 AAROM AROM 0 0  Ankle dorsiflexion         Ankle plantarflexion         Ankle inversion         Ankle eversion          (Blank rows = not tested)  LOWER EXTREMITY MMT:  MMT Right eval Left eval Right 05/19/24  Hip flexion 3-  4  Hip extension     Hip abduction     Hip adduction     Hip internal rotation     Hip external rotation     Knee flexion   4 (sitting)  Knee extension 3-  4+  Ankle dorsiflexion   4+  Ankle plantarflexion     Ankle inversion     Ankle eversion      (Blank rows = not  tested)  FUNCTIONAL TESTS:  5 times sit to stand: 31.71 sec using hands heavily on walker; right foot advanced 2 minute walk test: 145 ft with RW  GAIT: Distance walked: 145 ft Assistive device utilized: Walker - 2 wheeled Level of assistance: SBA Comments: antalgic gait; decreased gait speed  TREATMENT DATE:  06/14/24 Bike seat 8 x 5' dynamic warm up Ambulation up and down 7 steps x 3 with 5# kettle bell to simulate carrying laundry basket up and down basement steps. Heel raises on incline x 20 with 1 UE assist Rocker board 2 x 10 F/B and S/S no UE assist Tandem stance on foam beam 2 x 30 no UE assist Squats to chair 2 x 10 2# hip abduction 2 x 10 2# hip extension 2 x 10 Alternation lunges on BOSU ball 2 x 10 Slant board 5 x 20 Hamstring stretch in sitting 5 x 20  06/09/24: Bike seat 8 x 5' dynamic warm up L3 resistance Vector stance 3x 5 1 HHA Rebounder with red weighted ball and trampoline 10x 3 set with foam under each foot Stair 7in reciprocal pattern 1 HR 5RT Tandem stance on foam 2x 30 no HHA required Squats 2x 10 front of chair for mechanics Knee drive 87pw step height 89k 10 Leg press 4Pl 2x 10 Gait 129ft with improve mechanics, minimal trunk sway Slant board 3x 30  05/31/24 Bike seat 8 x 5' dynamic warm up Heel raises on incline x 20 no UE assist Toe raises on decline x 20 no UE assist 6 box Step ups 2 x 10 6 box lateral step ups 2 x 10 12 box right knee flexion x 2' Walk 1 lap around PT gym without AD then use of mirror for visual cues to correct trunk sway 2# hip abduction 2 x 10 2# hip extension 2 x 10  Squats 2 x 10 Step navigation x 3 reps up and down 7 step BOSU ball alternating lunges 2 x 10 no UE assist Tandem stance x 30 each minimal use of hands        05/24/24: Bike, seat 8, full revolutions,  5' Standing at // bars: - Squats front of chair 20x - SLS Rt 8 max; Lt 12 - Tandem stance 1x 30 on floor, 2x 30 on foam intermittent HHA - Sidestep with RTB around thigh 3RT - Vector stance 3x 5 - 6in step up and over 10x - Lateral step up 6in 10x Supine: STM and manual ROM to right knee x 10' to decrease stiffness and improve joint extensibility AROM 120 degrees  05/22/24 STM and manual ROM to right knee x 10' to decrease stiffness and improve joint extensibility AAROM right knee 118 today Heel raises on incline 2 x 10 Toe raises on decline 2 x 10 Squats to chair for target 2 x 10 6 box step ups 2 x 10 6 box lateral step ups 2 x 10 Tandem stance 2 x 30 Hip abduction  x 10 each Hip extension  x 10 each Bike seat 8 x 5' full revolutions Updated HEP   PATIENT EDUCATION:  Education details: Patient educated on exam findings, POC, scope of PT, HEP, and what to expect next visit. Person educated: Patient Education method: Explanation, Demonstration, and Handouts Education comprehension: verbalized understanding, returned demonstration, verbal cues required, and tactile cues required  HOME EXERCISE PROGRAM: Access Code: [email or web address] URL: [email or web address] Date: 05/22/2024 Prepared by: [facility]  Exercises - Seated Heel Toe Raises  - 1 x daily - 7 x weekly - 3 sets - 10 reps - Seated Heel Slide  - 1 x daily - 7 x weekly - 2 sets - 10 reps - Supine Heel Slide  - 1 x daily - 7 x weekly - 2 sets - 10 reps - Supine Knee  Extension Strengthening  - 1 x daily - 7 x weekly - 2 sets - 10 reps - Supine Active Straight Leg Raise  - 1 x daily - 7 x weekly - 3 sets - 10 reps - Standing Ankle Plantar Flexion Dorsiflexion with Counter Support  - 1 x daily - 7 x weekly - 2 sets - 10 reps - Mini Squat with Counter Support  - 1 x daily - 7 x weekly - 2 sets - 10 reps - tandem stance balance; try not to hold on  - 1 x daily - 7 x weekly - 1 sets - 3 reps - 30 sec hold -  Standing Hip Abduction with Counter Support  - 1 x daily - 7 x weekly - 2 sets - 10 reps - Standing Hip Extension with Counter Support  - 1 x daily - 7 x weekly - 2 sets - 10 reps  05/01/24- Supine Knee Extension Strengthening  - 1 x daily - 7 x weekly - 2 sets - 10 reps Access Code: [email or web address] URL: [email or web address] Date: 04/27/2024 Prepared by: [facility]  Exercises - Seated Heel Toe Raises  - 1 x daily - 7 x weekly - 3 sets - 10 reps - Seated Heel Slide  - 1 x daily - 7 x weekly - 2 sets - 10 reps - Supine Ankle Pumps  - 1 x daily - 7 x weekly - 2 sets - 10 reps - Supine Quad Set  - 1 x daily - 7 x weekly - 2 sets - 10 reps - 5 sec hold - Supine Heel Slide  - 1 x daily - 7 x weekly - 2 sets - 10 reps - Supine Heel Slide with Strap  - 1 x daily - 7 x weekly - 2 sets - 10 reps  06/09/24:  - Side Stepping with Resistance at Thighs and Counter Support  - 2 x daily - 7 x weekly - 1 sets - 10 reps - Single Leg Stance  - 2 x daily - 7 x weekly - 1 sets - 3 reps - 30 hold  ASSESSMENT:  CLINICAL IMPRESSION: Arrives without AD with mild limp noted.  Worked on navigating steps carry a weight to simulate carrying her laundry up and down steps.  She has more difficulty with control of descent but no loss of balance noted.  Continued to work on balance activity today.  Initially had difficulty with front and back rocker board but improved with continued repetitions.  Overall patient is progressing well.  Will reassess next visit for possible discharge.     Eval: Patient is a 74 y.o. female who was seen today for physical therapy evaluation and treatment for M17.11 (ICD-10-CM) - Primary osteoarthritis of right knee; s/p right TKA 04/25/24. Patient demonstrates muscle weakness, reduced ROM, gait impairments and fascial restrictions which are likely contributing to symptoms of pain and are negatively impacting patient ability to perform ADLs and functional mobility tasks. Patient will  benefit from skilled physical therapy services to address these deficits to reduce pain and improve level of function with ADLs and functional mobility tasks.   OBJECTIVE IMPAIRMENTS: Abnormal gait, decreased activity tolerance, difficulty walking, decreased ROM, decreased strength, increased fascial restrictions, impaired perceived functional ability, and pain.   ACTIVITY LIMITATIONS: carrying, lifting, bending, sitting, standing, squatting, sleeping, stairs, transfers, bed mobility, bathing, toileting, dressing, and locomotion level  PARTICIPATION LIMITATIONS: meal prep, cleaning, laundry, driving, shopping, community activity, and yard work  [facility] POTENTIAL: [place]  CLINICAL DECISION MAKING: Evolving/moderate complexity  EVALUATION COMPLEXITY: Moderate   GOALS: Goals reviewed with patient? No  SHORT TERM GOALS: Target date: 05/11/2024 patient will be independent with initial HEP  Baseline: Goal status: met  2.  Patient will report 50% improvement overall  Baseline:  Goal status: met  3.  Patient will increase right knee mobility to -2 to 90 to promote normal navigation of steps; step over step pattern  Baseline: see above Goal status: met     LONG TERM GOALS: Target date: 1024/2025  Patient will be independent in self management strategies to improve quality of life and functional outcomes.  Baseline:  Goal status:in progress  2.  Patient will report 70% improvement overall  Baseline:  Goal status: in progress   3.  Patient will improve LEFS score by 26 points to demonstrate improved perceived function  Baseline: 05/19/24 38/80 (24 points) Goal status: in progress  4.  Patient will increase right knee mobility to -2 to 120 to promote normal navigation of steps; step over step pattern  Baseline: -4 to 75; 05/19/24 0 to 115 Goal status: in progress  5.   Patient will increase right leg MMT's to 5/5 to allow navigation of steps without gait deviation or loss of  balance  Baseline: see above Goal status: in progress  PLAN:  PT FREQUENCY: 2x/week to 3 times a week  PT DURATION: 6 weeks  PLANNED INTERVENTIONS: [id number]- PT Re-evaluation, 97110-Therapeutic exercises, 97530- Therapeutic activity, 97112- Neuromuscular re-education, 97535- Self Care, [id number]- Manual therapy, [id number]- Gait training, [phone]- Orthotic Fit/training, [phone]- Canalith repositioning, [id number]- Aquatic Therapy, 97760- Splinting, 97597- Wound care (first 20 sq cm), 97598- Wound care (each additional 20 sq cm)Patient/Family education, Balance training, Stair training, Taping, Dry Needling, Joint mobilization, Joint manipulation, Spinal manipulation, Spinal mobilization, Scar mobilization, and DME instructions.   PLAN FOR NEXT SESSION:  right knee mobility and strength; gait training.    Add hip stability and balance activities. Reassess next visit  10:29 AM, 06/14/24 Vivica Dobosz Small Maritza [place] MPT [facility] [place] [phone]

## 2024-06-16 ENCOUNTER — Ambulatory Visit (HOSPITAL_COMMUNITY)

## 2024-06-16 DIAGNOSIS — M25661 Stiffness of right knee, not elsewhere classified: Secondary | ICD-10-CM | POA: Diagnosis not present

## 2024-06-16 DIAGNOSIS — R262 Difficulty in walking, not elsewhere classified: Secondary | ICD-10-CM

## 2024-06-16 DIAGNOSIS — M25561 Pain in right knee: Secondary | ICD-10-CM

## 2024-06-16 NOTE — Therapy (Signed)
 OUTPATIENT PHYSICAL THERAPY LOWER EXTREMITY TREATMENT/PROGRESS NOTE PHYSICAL THERAPY DISCHARGE SUMMARY  Visits from Start of Care: 16  Current functional level related to goals / functional outcomes: See below   Remaining deficits: See below   Education / Equipment: HEP   Patient agrees to discharge. Patient goals were partially met. Patient is being discharged due to being pleased with the current functional level. Only missed goal for LEFS by 2 points.    Patient Name: Stephanie [name] MRN: [id number] DOB:08-May-1951, 73 y.o., female Today's Date: 06/16/2024  END OF SESSION:  PT End of Session - 06/16/24 0906     Visit Number 16    Number of Visits 20    Date for Recertification  06/16/24    Authorization Type [employer]    Authorization Time Period no auth required    Progress Note Due on Visit 20   Progress note complete visit #10   PT Start Time 0907    PT Stop Time 0945    PT Time Calculation (min) 38 min    Activity Tolerance Patient tolerated treatment well    Behavior During Therapy [facility] for tasks assessed/performed           Past Medical History:  Diagnosis Date   Diabetes mellitus without complication (HCC)    GERD (gastroesophageal reflux disease)    Hypercholesteremia    Hypertension    Hypothyroidism    PONV (postoperative nausea and vomiting)    Sleep apnea    Past Surgical History:  Procedure Laterality Date   ABDOMINAL HYSTERECTOMY     BREAST BIOPSY Left 2018   HYALINIZED FIBROADENOMATOID NODULE WITH CALCIFICATIONS   COLONOSCOPY N/A 12/26/2012   Procedure: COLONOSCOPY;  Surgeon: Lamar CHRISTELLA Hollingshead, MD;  Location: [facility];  Service: Endoscopy;  Laterality: N/A;  9:15 AM   COLONOSCOPY WITH PROPOFOL  N/A 04/26/2023   Procedure: COLONOSCOPY WITH PROPOFOL ;  Surgeon: Hollingshead Lamar CHRISTELLA, MD;  Location: [facility];  Service: Endoscopy;  Laterality: N/A;  930am, asa 3   TOTAL KNEE ARTHROPLASTY Left 03/04/2016   Procedure: LEFT TOTAL KNEE  ARTHROPLASTY;  Surgeon: Taft FORBES Minerva, MD;  Location: [facility] ORS;  Service: Orthopedics;  Laterality: Left;   TOTAL KNEE ARTHROPLASTY Right 04/25/2024   Procedure: ARTHROPLASTY, KNEE, TOTAL;  Surgeon: Minerva Taft FORBES, MD;  Location: [facility];  Service: Orthopedics;  Laterality: Right;   TUBAL LIGATION     Patient Active Problem List   Diagnosis Date Noted   Osteoarthritis of right knee 04/25/2024   Hypertension, essential 12/16/2022   OSA (obstructive sleep apnea) 03/13/2022   S/P total knee replacement, left 03/04/16    Arthritis of knee, degenerative 02/23/2013   ACHILLES TENDINITIS 06/04/2009   ANKLE PAIN, LEFT 12/01/2007    PCP: Sheryle Carwin, MD  REFERRING PROVIDER: Minerva Taft, MD Next apt: 05/29/24:  REFERRING DIAG: s/p right TKA  THERAPY DIAG:  Acute pain of right knee  Stiffness of right knee, not elsewhere classified  Difficulty in walking, not elsewhere classified  Rationale for Evaluation and Treatment: Rehabilitation  ONSET DATE: 04/25/24  SUBJECTIVE:   SUBJECTIVE STATEMENT: A little stiff this morning due to cooler weather; no pain; has resumed all her normal activities at home except carrying her clothes basket down the steps to the laundry in the basement.  90% better  Eval:Right knee worn out; s/p right knee TKA 04/25/24 per harrison; discharged home yesterday; has a CPM at home  PERTINENT HISTORY: Left TKA 2017 per Minerva PAIN:  Are you having  pain? Yes: NPRS scale: 4/10 Pain location: right knee Pain description: tight Aggravating factors: standing, walking, bending Relieving factors: pain meds; ice  PRECAUTIONS: Fall  RED FLAGS: None   WEIGHT BEARING RESTRICTIONS: No  FALLS:  Has patient fallen in last 6 months? No  LIVING ENVIRONMENT: Lives with: lives with their spouse Lives in: House/apartment Stairs: Yes: Internal: 11 steps; on right going upwasher and dryer downstairs Has following equipment at home: Single point cane,  Walker - 2 wheeled, and bed side commode  OCCUPATION: retired  PLOF: Independent  PATIENT GOALS: to walk and bend better  NEXT MD VISIT: 07/03/24  OBJECTIVE:  Note: Objective measures were completed at Evaluation unless otherwise noted.  DIAGNOSTIC FINDINGS:   PATIENT SURVEYS:  LEFS  Extreme difficulty/unable (0), Quite a bit of difficulty (1), Moderate difficulty (2), Little difficulty (3), No difficulty (4) Survey date:    Any of your usual work, housework or school activities   2. Usual hobbies, recreational or sporting activities   3. Getting into/out of the bath   4. Walking between rooms   5. Putting on socks/shoes   6. Squatting    7. Lifting an object, like a bag of groceries from the floor   8. Performing light activities around your home   9. Performing heavy activities around your home   10. Getting into/out of a car   11. Walking 2 blocks   12. Walking 1 mile   13. Going up/down 10 stairs (1 flight)   14. Standing for 1 hour   15.  sitting for 1 hour   16. Running on even ground   17. Running on uneven ground   18. Making sharp turns while running fast   19. Hopping    20. Rolling over in bed   Score total:  14/80 17.5%     COGNITION: Overall cognitive status: Within functional limits for tasks assessed     SENSATION: WFL  EDEMA:  Normal for this time s/p   POSTURE: rounded shoulders, forward head, and flexed trunk   PALPATION: General soreness normal for this time s/p  LOWER EXTREMITY ROM:  Active ROM Right eval Left eval Right 05/01/24 Right 05/05/24 Right 05/10/24 Right 10/01/225 Right 05/24/24:   Hip flexion          Hip extension          Hip abduction          Hip adduction          Hip internal rotation          Hip external rotation          Knee flexion 75 supine  86 AAROM 101 AAROM 110 AAROM AROM 115, AAROM 117 120 120  Knee extension -4 0   0 AAROM AROM 0 0 -2, 0 AA  Ankle dorsiflexion          Ankle plantarflexion           Ankle inversion          Ankle eversion           (Blank rows = not tested)  LOWER EXTREMITY MMT:  MMT Right eval Left eval Right 05/19/24 Right 06/16/24  Hip flexion 3-  4 5  Hip extension      Hip abduction      Hip adduction      Hip internal rotation      Hip external rotation      Knee flexion   4 (  sitting) 5  Knee extension 3-  4+ 5  Ankle dorsiflexion   4+ 5  Ankle plantarflexion      Ankle inversion      Ankle eversion       (Blank rows = not tested)  FUNCTIONAL TESTS:  5 times sit to stand: 31.71 sec using hands heavily on walker; right foot advanced 2 minute walk test: 145 ft with RW  GAIT: Distance walked: 145 ft Assistive device utilized: [employer]ant - 2 wheeled Level of assistance: SBA Comments: antalgic gait; decreased gait speed                                                                                                                                TREATMENT DATE: 06/16/24  Bike seat 8 x 5' dynamic warm up Knee flexion on box x 2' right knee 5 times sit to stand 10.39 sec no UE assist 2 MWT 474 ft no AD  06/14/24 Bike seat 8 x 5' dynamic warm up Ambulation up and down 7 steps x 3 with 5# kettle bell to simulate carrying laundry basket up and down basement steps. Heel raises on incline x 20 with 1 UE assist Rocker board 2 x 10 F/B and S/S no UE assist Tandem stance on foam beam 2 x 30 no UE assist Squats to chair 2 x 10 2# hip abduction 2 x 10 2# hip extension 2 x 10 Alternation lunges on BOSU ball 2 x 10 Slant board 5 x 20 Hamstring stretch in sitting 5 x 20  06/09/24: Bike seat 8 x 5' dynamic warm up L3 resistance Vector stance 3x 5 1 HHA Rebounder with red weighted ball and trampoline 10x 3 set with foam under each foot Stair 7in reciprocal pattern 1 HR 5RT Tandem stance on foam 2x 30 no HHA required Squats 2x 10 front of chair for mechanics Knee drive 87pw step height 89k 10 Leg press 4Pl 2x 10 Gait 171ft with improve  mechanics, minimal trunk sway Slant board 3x 30  05/31/24 Bike seat 8 x 5' dynamic warm up Heel raises on incline x 20 no UE assist Toe raises on decline x 20 no UE assist 6 box Step ups 2 x 10 6 box lateral step ups 2 x 10 12 box right knee flexion x 2' Walk 1 lap around PT gym without AD then use of mirror for visual cues to correct trunk sway 2# hip abduction 2 x 10 2# hip extension 2 x 10  Squats 2 x 10 Step navigation x 3 reps up and down 7 step BOSU ball alternating lunges 2 x 10 no UE assist Tandem stance x 30 each minimal use of hands        05/24/24: Bike, seat 8, full revolutions, 5' Standing at // bars: - Squats front of chair 20x - SLS Rt 8 max; Lt 12 - Tandem stance 1x 30 on floor, 2x 30 on foam intermittent HHA -  Sidestep with RTB around thigh 3RT - Vector stance 3x 5 - 6in step up and over 10x - Lateral step up 6in 10x Supine: STM and manual ROM to right knee x 10' to decrease stiffness and improve joint extensibility AROM 120 degrees  05/22/24 STM and manual ROM to right knee x 10' to decrease stiffness and improve joint extensibility AAROM right knee 118 today Heel raises on incline 2 x 10 Toe raises on decline 2 x 10 Squats to chair for target 2 x 10 6 box step ups 2 x 10 6 box lateral step ups 2 x 10 Tandem stance 2 x 30 Hip abduction  x 10 each Hip extension  x 10 each Bike seat 8 x 5' full revolutions Updated HEP   PATIENT EDUCATION:  Education details: Patient educated on exam findings, POC, scope of PT, HEP, and what to expect next visit. Person educated: Patient Education method: Explanation, Demonstration, and Handouts Education comprehension: verbalized understanding, returned demonstration, verbal cues required, and tactile cues required  HOME EXERCISE PROGRAM: Access Code: [email or web address] URL: [email or web address] Date: 05/22/2024 Prepared by: [facility]  Exercises - Seated Heel Toe Raises  - 1 x daily -  7 x weekly - 3 sets - 10 reps - Seated Heel Slide  - 1 x daily - 7 x weekly - 2 sets - 10 reps - Supine Heel Slide  - 1 x daily - 7 x weekly - 2 sets - 10 reps - Supine Knee Extension Strengthening  - 1 x daily - 7 x weekly - 2 sets - 10 reps - Supine Active Straight Leg Raise  - 1 x daily - 7 x weekly - 3 sets - 10 reps - Standing Ankle Plantar Flexion Dorsiflexion with Counter Support  - 1 x daily - 7 x weekly - 2 sets - 10 reps - Mini Squat with Counter Support  - 1 x daily - 7 x weekly - 2 sets - 10 reps - tandem stance balance; try not to hold on  - 1 x daily - 7 x weekly - 1 sets - 3 reps - 30 sec hold - Standing Hip Abduction with Counter Support  - 1 x daily - 7 x weekly - 2 sets - 10 reps - Standing Hip Extension with Counter Support  - 1 x daily - 7 x weekly - 2 sets - 10 reps  05/01/24- Supine Knee Extension Strengthening  - 1 x daily - 7 x weekly - 2 sets - 10 reps Access Code: [email or web address] URL: [email or web address] Date: 04/27/2024 Prepared by: [facility]  Exercises - Seated Heel Toe Raises  - 1 x daily - 7 x weekly - 3 sets - 10 reps - Seated Heel Slide  - 1 x daily - 7 x weekly - 2 sets - 10 reps - Supine Ankle Pumps  - 1 x daily - 7 x weekly - 2 sets - 10 reps - Supine Quad Set  - 1 x daily - 7 x weekly - 2 sets - 10 reps - 5 sec hold - Supine Heel Slide  - 1 x daily - 7 x weekly - 2 sets - 10 reps - Supine Heel Slide with Strap  - 1 x daily - 7 x weekly - 2 sets - 10 reps  06/09/24:  - Side Stepping with Resistance at Thighs and Counter Support  - 2 x daily - 7 x weekly - 1 sets - 10 reps - Single Leg Stance  -  2 x daily - 7 x weekly - 1 sets - 3 reps - 30 hold  ASSESSMENT:  CLINICAL IMPRESSION: Progress note today.  Patient has met all but one set rehab goal and is agreeable to discharge at this time.    Eval: Patient is a 73 y.o. female who was seen today for physical therapy evaluation and treatment for M17.11 (ICD-10-CM) - Primary osteoarthritis of  right knee; s/p right TKA 04/25/24. Patient demonstrates muscle weakness, reduced ROM, gait impairments and fascial restrictions which are likely contributing to symptoms of pain and are negatively impacting patient ability to perform ADLs and functional mobility tasks. Patient will benefit from skilled physical therapy services to address these deficits to reduce pain and improve level of function with ADLs and functional mobility tasks.   OBJECTIVE IMPAIRMENTS: Abnormal gait, decreased activity tolerance, difficulty walking, decreased ROM, decreased strength, increased fascial restrictions, impaired perceived functional ability, and pain.   ACTIVITY LIMITATIONS: carrying, lifting, bending, sitting, standing, squatting, sleeping, stairs, transfers, bed mobility, bathing, toileting, dressing, and locomotion level  PARTICIPATION LIMITATIONS: meal prep, cleaning, laundry, driving, shopping, community activity, and yard work  [facility] POTENTIAL: Good  CLINICAL DECISION MAKING: Evolving/moderate complexity  EVALUATION COMPLEXITY: Moderate   GOALS: Goals reviewed with patient? No  SHORT TERM GOALS: Target date: 05/11/2024 patient will be independent with initial HEP  Baseline: Goal status: met  2.  Patient will report 50% improvement overall  Baseline:  Goal status: met  3.  Patient will increase right knee mobility to -2 to 90 to promote normal navigation of steps; step over step pattern  Baseline: see above Goal status: met     LONG TERM GOALS: Target date: 1024/2025  Patient will be independent in self management strategies to improve quality of life and functional outcomes.  Baseline:  Goal status:met  2.  Patient will report 70% improvement overall  Baseline:  Goal status:met  3.  Patient will improve LEFS score by 26 points to demonstrate improved perceived function  Baseline: 05/19/24 38/80 (24 points); 48/50 (missed goal by 2 points) Goal status: in progress  4.   Patient will increase right knee mobility to -2 to 120 to promote normal navigation of steps; step over step pattern  Baseline: -4 to 75; 05/19/24 0 to 115 Goal status:met  5.   Patient will increase right leg MMT's to 5/5 to allow navigation of steps without gait deviation or loss of balance  Baseline: see above Goal status:met  PLAN:  PT FREQUENCY: 2x/week to 3 times a week  PT DURATION: 6 weeks  PLANNED INTERVENTIONS: 97164- PT Re-evaluation, 97110-Therapeutic exercises, 97530- Therapeutic activity, 97112- Neuromuscular re-education, 97535- Self Care, 02859- Manual therapy, [id number]- Gait training, [phone]- Orthotic Fit/training, [phone]- Canalith repositioning, [id number]- Aquatic Therapy, 97760- Splinting, 97597- Wound care (first 20 sq cm), 97598- Wound care (each additional 20 sq cm)Patient/Family education, Balance training, Stair training, Taping, Dry Needling, Joint mobilization, Joint manipulation, Spinal manipulation, Spinal mobilization, Scar mobilization, and DME instructions.   PLAN FOR NEXT SESSION:  discharge  9:43 AM, 06/16/24 Zuleica Seith Small Harshith Pursell MPT [place] [facility] [place] [phone]

## 2024-07-03 ENCOUNTER — Encounter: Payer: Self-pay | Admitting: Orthopedic Surgery

## 2024-07-03 ENCOUNTER — Ambulatory Visit: Admitting: Orthopedic Surgery

## 2024-07-03 DIAGNOSIS — Z96651 Presence of right artificial knee joint: Secondary | ICD-10-CM

## 2024-07-03 NOTE — Progress Notes (Signed)
    07/03/2024   Chief Complaint  Patient presents with   Post-op Follow-up    Right TKR 04/25/24 improving     No diagnosis found.  What pharmacy do you use ? _______[facility]____  DOI/DOS/ Date: 04/25/24  Did you get better, worse or no change (Answer below)   Improved

## 2024-07-03 NOTE — Progress Notes (Signed)
   POST OP VISIT   Patient: Stephanie [name]           Date of Birth: 1951-03-27           MRN: [id number] Visit Date: 07/03/2024 Requested by: Stephanie Carwin, MD [address] [place],  [place] [address] PCP: Stephanie Carwin, MD   Encounter Diagnosis  Name Primary?   S/P total knee replacement, right 04/25/24 Yes     Chief Complaint  Patient presents with   Post-op Follow-up    Right TKR 04/25/24 improving     No Known Allergies  Doing well   Pain controlled   ROM 0-110  No s/s of infx  ASSESSMENT AND PLAN:  Doing well, f/u 4 months  Continue home exercises  I have reviewed the patient's history and given the presence of a fragility fracture, I have deemed the necessity of a osteoporosis management referral or confirmed that the patient is currently enrolled in a osteoporosis treatment program.  Per [facility] clinic policy, our goal is ensure optimal postoperative pain control with a multimodal pain management strategy. For all [facility] patients, our goal is to wean post-operative narcotic medications by 6 weeks post-operatively. If this is not possible due to utilization of pain medication prior to surgery, your [facility] doctor will support your acute post-operative pain control for the first 6 weeks postoperatively, with a plan to transition you back to your primary pain team following that. Stephanie [name] will work to ensure a therapist, occupational.

## 2024-07-11 DIAGNOSIS — G4733 Obstructive sleep apnea (adult) (pediatric): Secondary | ICD-10-CM | POA: Diagnosis not present

## 2024-09-07 ENCOUNTER — Telehealth: Payer: Self-pay

## 2024-09-07 DIAGNOSIS — I10 Essential (primary) hypertension: Secondary | ICD-10-CM

## 2024-09-07 NOTE — Progress Notes (Unsigned)
 Complex Care Management Note Care Guide Note  09/07/2024 Name: Stephanie [name] MRN: [id number] DOB: 04/02/1951   Complex Care Management Outreach Attempts: An unsuccessful telephone outreach was attempted today to offer the patient information about available complex care management services.  Follow Up Plan:  Additional outreach attempts will be made to offer the patient complex care management information and services.   Encounter Outcome:  No Answer-No voicemail-Unable to leave a message   Leotis Rase [facility], [facility] Guide  Direct Dial: [phone]  Fax [phone]

## 2024-09-08 NOTE — Progress Notes (Signed)
 Complex Care Management Note  Care Guide Note 09/08/2024 Name: Stephanie [name] MRN: [id number] DOB: 09-Oct-1950  Stephanie [name] is a 74 y.o. year old female who sees Sheryle Carwin, MD for primary care. I reached out to Stephanie [name] by phone today to offer complex care management services.  Ms. Rydberg was given information about Complex Care Management services today including:   The Complex Care Management services include support from the care team which includes your Nurse Care Manager, Clinical Social Worker, or Pharmacist.  The Complex Care Management team is here to help remove barriers to the health concerns and goals most important to you. Complex Care Management services are voluntary, and the patient may decline or stop services at any time by request to their care team member.   Complex Care Management Consent Status: Patient agreed to services and verbal consent obtained.   Follow up plan:  Telephone appointment with complex care management team member scheduled for:  09/13/24 @ 10 AM  Encounter Outcome:  Patient Scheduled  Leotis Rase [facility], [facility] Guide  Direct Dial: [phone]  Fax [phone]

## 2024-09-13 ENCOUNTER — Other Ambulatory Visit: Payer: Self-pay

## 2024-09-13 NOTE — Patient Instructions (Signed)
 Visit Information  Thank you for taking time to visit with me today. Please don't hesitate to contact me if I can be of assistance to you before our next scheduled appointment.  Our next appointment is by telephone on 09/25/24 at 11:30 am. Please call the care guide team at [phone] if you need to cancel or reschedule your appointment.   Following is a copy of your care plan:   Goals Addressed             This Visit's Progress    VBCI RN Care Plan- DMII       Problems:  Chronic Disease Management support and education needs related to DMII  Goal: Over the next 90 days the Patient will attend all scheduled medical appointments: with providers and specialists pertaining to health maintenance as evidenced by patient report and/or chart review.         continue to work with Medical Illustrator and/or Social Worker to address care management and care coordination needs related to DMII as evidenced by adherence to care management team scheduled appointments     demonstrate Ongoing adherence to prescribed treatment plan for DMII as evidenced by taking medications as prescribed, following a low carb diet, and being consistent in  checking BS daily and keeping a log. demonstrate Ongoing health management independence as evidenced by checking BS results daily and monitoring carb intake.         verbalize basic understanding of DMII disease process and self health management plan as evidenced by understanding what her medications are for and the importance of compliance with treatment plan. verbalize understanding of plan for management of DMII as evidenced by beginning to check BS daily and keep a log and making MD aware of any abnormal readings.    Interventions:   Diabetes Interventions: Assessed patient's understanding of A1c goal: <6.5% Provided education to patient about basic DM disease process Reviewed medications with patient and discussed importance of medication adherence Counseled on  importance of regular laboratory monitoring as prescribed Provided patient with written educational materials related to hypo and hyperglycemia and importance of correct treatment Advised patient, providing education and rationale, to check cbg daily and record, calling provider for findings outside established parameters Screening for signs and symptoms of depression related to chronic disease state  Assessed social determinant of health barriers Lab Results  Component Value Date   HGBA1C 6.5 (H) 04/18/2024    Patient Self-Care Activities:  Attend all scheduled provider appointments Call pharmacy for medication refills 3-7 days in advance of running out of medications Call provider office for new concerns or questions  Perform all self care activities independently  Perform IADL's (shopping, preparing meals, housekeeping, managing finances) independently Take medications as prescribed   check blood sugar at prescribed times: once daily check feet daily for cuts, sores or redness enter blood sugar readings and medication or insulin  into daily log take the blood sugar log to all doctor visits set goal weight trim toenails straight across drink 6 to 8 glasses of water  each day eat fish at least once per week fill half of plate with vegetables keep a food diary limit fast food meals to no more than 1 per week manage portion size prepare main meal at home 3 to 5 days each week set a realistic goal switch to low-fat or skim milk switch to sugar-free drinks keep feet up while sitting wash and dry feet carefully every day wear comfortable, cotton socks wear comfortable, well-fitting shoes  Plan:  Telephone follow up appointment with care management team member scheduled for:  09/25/24 at 11:30 am          VBCI RN Care Plan-HTN       Problems:  Chronic Disease Management support and education needs related to HTN  Goal: Over the next 90 days the Patient will attend all  scheduled medical appointments: with providers and specialists pertaining to health maintenance as evidenced by patient report and/or chart review.        continue to work with Medical Illustrator and/or Social Worker to address care management and care coordination needs related to HTN as evidenced by adherence to care management team scheduled appointments     demonstrate Ongoing adherence to prescribed treatment plan for HTN as evidenced by continuing to take medications as prescribed, monitoring sodium intake, and checking and recording BP daily.  demonstrate Ongoing health management independence as evidenced by being compliant with all medications, monitoring sodium intake, checking BP daily and keeping a log, and reporting any elevated readings to provider.         take all medications exactly as prescribed and will call provider for medication related questions as evidenced by patient report and/or chart review.     verbalize basic understanding of HTN disease process and self health management plan as evidenced by patient being consistent with checking and recording BP results daily, taking all medications on time and as prescribed and alerting provider for any changes or concerns.   Interventions:   Hypertension Interventions: Last practice recorded BP readings:  BP Readings from Last 3 Encounters:  09/13/24 120/63  04/26/24 125/64  04/18/24 122/69   Most recent eGFR/CrCl: No results found for: EGFR  No components found for: CRCL  Evaluation of current treatment plan related to hypertension self management and patient's adherence to plan as established by provider Provided education to patient re: stroke prevention, s/s of heart attack and stroke Reviewed medications with patient and discussed importance of compliance Counseled on the importance of exercise goals with target of 150 minutes per week Discussed plans with patient for ongoing care management follow up and provided patient  with direct contact information for care management team Advised patient, providing education and rationale, to monitor blood pressure daily and record, calling PCP for findings outside established parameters Provided education on prescribed diet low sodium Discussed complications of poorly controlled blood pressure such as heart disease, stroke, circulatory complications, vision complications, kidney impairment, sexual dysfunction Screening for signs and symptoms of depression related to chronic disease state  Assessed social determinant of health barriers  Patient Self-Care Activities:  Attend all scheduled provider appointments Call pharmacy for medication refills 3-7 days in advance of running out of medications Call provider office for new concerns or questions  Perform all self care activities independently  Perform IADL's (shopping, preparing meals, housekeeping, managing finances) independently Take medications as prescribed   check blood pressure daily write blood pressure results in a log or diary learn about high blood pressure keep a blood pressure log take blood pressure log to all doctor appointments call doctor for signs and symptoms of high blood pressure keep all doctor appointments take medications for blood pressure exactly as prescribed report new symptoms to your doctor eat more whole grains, fruits and vegetables, lean meats and healthy fats limit salt intake to 1.500 mg/day  Plan:  Telephone follow up appointment with care management team member scheduled for:  09/25/24 at 11:30 am  Please call the Suicide and Crisis Lifeline: 988 call the [place]  National Suicide Prevention Lifeline: 409-351-3102 or TTY: 929-717-0157 TTY 585 506 0397) to talk to a trained counselor call 1-800-273-TALK (toll free, 24 hour hotline) if you are experiencing a Mental Health or Behavioral Health Crisis or need someone to talk to.  Patient verbalized understanding of  Care plan and visit instructions communicated this visit  Warren Quivers RN CM Population Health-Complex Care Management [facility] [phone]

## 2024-09-13 NOTE — Patient Outreach (Signed)
 Complex Care Management   Visit Note  09/13/2024  Name:  Stephanie [name] MRN: [id number] DOB: June 03, 1951  Situation: Referral received for Complex Care Management related to HTN and DMII. I obtained verbal consent from Patient.  Visit completed with Patient  on the phone  Background:   Past Medical History:  Diagnosis Date   Diabetes mellitus without complication (HCC)    GERD (gastroesophageal reflux disease)    Hypercholesteremia    Hypertension    Hypothyroidism    PONV (postoperative nausea and vomiting)    Sleep apnea     Assessment: Patient Reported Symptoms:  Cognitive Cognitive Status: No symptoms reported, Normal speech and language skills, Alert and oriented to person, place, and time Cognitive/Intellectual Conditions Management [RPT]: None reported or documented in medical history or problem list   Health Maintenance Behaviors: Annual physical exam, Sleep adequate, Healthy diet, Stress management Healing Pattern: Unsure Health Facilitated by: Healthy diet, Prayer/meditation, Rest, Stress management  Neurological Neurological Review of Symptoms: No symptoms reported (Patient reports her headaches are much better since starting her CPAP at night.) Neurological Management Strategies: Routine screening, Adequate rest Neurological Self-Management Outcome: 4 (good)  HEENT HEENT Symptoms Reported: No symptoms reported HEENT Management Strategies: Adequate rest, Routine screening HEENT Self-Management Outcome: 4 (good)    Cardiovascular Does patient have uncontrolled Hypertension?: No, Yes Is patient checking Blood Pressure at home?: Yes Patient's Recent BP reading at home: Patient reports checking BP occasionally- last reading today was 120/63. Reiterated the importance of checking daily and keeping a log. Cardiovascular Management Strategies: Medication therapy, Routine screening, Adequate rest Weight: 207 lb (93.9 kg) Cardiovascular Self-Management Outcome: 4  (good)  Respiratory Respiratory Symptoms Reported: No symptoms reported Other Respiratory Symptoms: Reports using CPAP machine nightly with good results. Respiratory Management Strategies: CPAP, Routine screening, Adequate rest Respiratory Self-Management Outcome: 4 (good)  Endocrine Endocrine Symptoms Reported: No symptoms reported Is patient diabetic?: Yes Is patient checking blood sugars at home?: Yes List most recent blood sugar readings, include date and time of day: RNCM advised of the importance of checking BS daily and keeping a log. Last BS check was 120 Endocrine Self-Management Outcome: 4 (good)  Gastrointestinal Gastrointestinal Symptoms Reported: No symptoms reported Additional Gastrointestinal Details: Reports normal BM's. Reports a BM every other day Gastrointestinal Management Strategies: Adequate rest, Medication therapy, Diet modification Gastrointestinal Self-Management Outcome: 4 (good)    Genitourinary Genitourinary Symptoms Reported: No symptoms reported Genitourinary Management Strategies: Coping strategies Genitourinary Self-Management Outcome: 4 (good)  Integumentary Integumentary Symptoms Reported: Other Other Integumentary Symptoms: small spot on left 3rd toe- MD is aware and monitoring. 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 Musculoskeletal Management Strategies: Routine screening, Coping strategies, Adequate rest Musculoskeletal Self-Management Outcome: 4 (good)      Psychosocial Psychosocial Symptoms Reported: No symptoms reported Behavioral Management Strategies: Activity, Adequate rest, Coping strategies, Support system Behavioral Health Self-Management Outcome: 4 (good) Major  Change/Loss/Stressor/Fears (CP): Denies Techniques to Cope with Loss/Stress/Change: Diversional activities, Spiritual practice(s) Quality of Family Relationships: involved, helpful, supportive Do you feel physically threatened by others?: No    09/13/2024    PHQ2-9 Depression Screening   Little interest or pleasure in doing things Not at all  Feeling down, depressed, or hopeless Not at all  PHQ-2 - Total Score 0  Trouble falling or staying asleep, or sleeping too much  Feeling tired or having little energy    Poor appetite or overeating     Feeling bad about yourself - or that you are a failure or have let yourself or your family down    Trouble concentrating on things, such as reading the newspaper or watching television    Moving or speaking so slowly that other people could have noticed.  Or the opposite - being so fidgety or restless that you have been moving around a lot more than usual    Thoughts that you would be better off dead, or hurting yourself in some way    PHQ2-9 Total Score    If you checked off any problems, how difficult have these problems made it for you to do your work, take care of things at home, or get along with other people    Depression Interventions/Treatment      Today's Vitals   09/13/24 1023  BP: 120/63  Weight: 207 lb (93.9 kg)   Pain Scale: 0-10 Pain Score: 0-No pain  Medications Reviewed Today     Reviewed by Leodis Warren DEL, RN (Registered Nurse) on 09/13/24 at 1013  Med List Status: <None>   Medication Order Taking? Sig Documenting Provider Last Dose Status Informant  aspirin  EC 81 MG tablet [id number] Yes Take 1 tablet (81 mg total) by mouth 2 times daily at 12 noon and 4 pm. Swallow whole. Margrette Taft BRAVO, MD  Active   bupivacaine -meloxicam  ER (ZYNRELEF ) injection 400 mg [id number]   Margrette Taft BRAVO, MD  Active   celecoxib  (CELEBREX ) 100 MG capsule [id number]  Take 1 capsule (100 mg total) by mouth 2 (two) times daily.  Patient not  taking: Reported on 09/13/2024   Margrette Taft BRAVO, MD  Active   Cholecalciferol  (VITAMIN D3 PO) [id number] Yes Take 2,000 Units by mouth in the morning. [provider]  Active Self  Cyanocobalamin  (VITAMIN B-12 PO) [id number] Yes Take 1 tablet by mouth in the morning. [provider]  Active Self  docusate sodium  (COLACE) 100 MG capsule [id number]  Take 1 capsule (100 mg total) by mouth 2 (two) times daily.  Patient not taking: Reported on 09/13/2024   Margrette Taft BRAVO, MD  Active   esomeprazole (NEXIUM) 20 MG capsule [id number] Yes Take 20 mg by mouth daily as needed (indigestion/heartburn.). [provider]  Active Self  hydrochlorothiazide  (HYDRODIURIL ) 25 MG tablet [id number] Yes Take 25 mg by mouth in the morning. [provider]  Active Self  latanoprost  (XALATAN ) 0.005 % ophthalmic solution [id number] Yes Place 1 drop into both eyes at bedtime. [provider]  Active Self  levothyroxine  (SYNTHROID ) 88 MCG tablet [id number] Yes Take 88 mcg by mouth daily before breakfast. [provider]  Active Self  losartan  (COZAAR ) 100 MG tablet [id number] Yes Take 100 mg by mouth in the morning. [provider]  Active Self  metFORMIN  (GLUCOPHAGE ) 1000 MG tablet [id number] Yes Take 500 mg by mouth in the morning and at bedtime. [provider]  Active Self  pioglitazone  (ACTOS ) 15 MG tablet [id number] Yes Take 15 mg by mouth in the morning. [provider]  Active Self  polyethylene glycol (MIRALAX  / GLYCOLAX ) 17 g packet [id number] Yes Take 17 g by mouth daily as needed for mild constipation. Margrette Taft BRAVO, MD  Active   pravastatin  (PRAVACHOL ) 40 MG tablet [id number] Yes Take 40 mg by mouth at bedtime. [provider]  Active Self  tiZANidine  (ZANAFLEX ) 2 MG tablet [id number]  Take 1  tablet (2 mg total) by mouth 3 (three) times daily.  Patient not taking: Reported on 09/13/2024   Margrette Taft BRAVO, MD  Active   Med  List Note Claud Micheal DASEN, CPhT 04/12/24 1226): CPAP at night            Recommendation:   Continue Current Plan of Care  Follow Up Plan:   Telephone follow up appointment date/time:  09/25/24 at 11:30 am  Warren Quivers RN [facility] Population Health-Complex Care Management [facility] [phone]

## 2024-09-25 ENCOUNTER — Telehealth: Payer: PRIVATE HEALTH INSURANCE

## 2024-09-27 ENCOUNTER — Ambulatory Visit

## 2024-10-30 ENCOUNTER — Ambulatory Visit: Payer: PRIVATE HEALTH INSURANCE | Admitting: Orthopedic Surgery
